# Patient Record
Sex: Female | Born: 1950 | Race: Black or African American | Hispanic: No | Marital: Married | Smoking: Never smoker
Health system: Southern US, Community
[De-identification: ages and names within clinical notes are randomized; demographics above are authoritative.]

## PROBLEM LIST (undated history)

## (undated) DIAGNOSIS — H409 Unspecified glaucoma: Secondary | ICD-10-CM

## (undated) DIAGNOSIS — E785 Hyperlipidemia, unspecified: Secondary | ICD-10-CM

## (undated) DIAGNOSIS — I10 Essential (primary) hypertension: Secondary | ICD-10-CM

## (undated) HISTORY — PX: TUBAL LIGATION: SHX77

## (undated) HISTORY — PX: OTHER SURGICAL HISTORY: SHX169

## (undated) HISTORY — DX: Unspecified glaucoma: H40.9

## (undated) HISTORY — DX: Hyperlipidemia, unspecified: E78.5

## (undated) HISTORY — DX: Essential (primary) hypertension: I10

---

## 1998-04-07 ENCOUNTER — Encounter: Payer: Self-pay | Admitting: Emergency Medicine

## 1998-04-07 ENCOUNTER — Emergency Department (HOSPITAL_COMMUNITY): Admission: EM | Admit: 1998-04-07 | Discharge: 1998-04-07 | Payer: Self-pay | Admitting: Emergency Medicine

## 1999-07-23 ENCOUNTER — Other Ambulatory Visit: Admission: RE | Admit: 1999-07-23 | Discharge: 1999-07-23 | Payer: Self-pay | Admitting: Obstetrics and Gynecology

## 2000-07-22 ENCOUNTER — Other Ambulatory Visit: Admission: RE | Admit: 2000-07-22 | Discharge: 2000-07-22 | Payer: Self-pay | Admitting: Obstetrics and Gynecology

## 2006-03-04 ENCOUNTER — Other Ambulatory Visit: Admission: RE | Admit: 2006-03-04 | Discharge: 2006-03-04 | Payer: Self-pay | Admitting: Obstetrics & Gynecology

## 2006-03-27 ENCOUNTER — Ambulatory Visit: Payer: Self-pay | Admitting: Family Medicine

## 2006-03-27 LAB — CONVERTED CEMR LAB
ALT: 16 units/L (ref 0–40)
AST: 21 units/L (ref 0–37)
Albumin: 3.6 g/dL (ref 3.5–5.2)
Alkaline Phosphatase: 57 units/L (ref 39–117)
BUN: 11 mg/dL (ref 6–23)
Basophils Absolute: 0 10*3/uL (ref 0.0–0.1)
Basophils Relative: 1.5 % — ABNORMAL HIGH (ref 0.0–1.0)
Bilirubin, Direct: 0.1 mg/dL (ref 0.0–0.3)
CO2: 27 meq/L (ref 19–32)
Calcium: 9.2 mg/dL (ref 8.4–10.5)
Chloride: 107 meq/L (ref 96–112)
Cholesterol: 291 mg/dL (ref 0–200)
Creatinine, Ser: 0.6 mg/dL (ref 0.4–1.2)
Creatinine,U: 231 mg/dL
Direct LDL: 215.6 mg/dL
Eosinophils Absolute: 0.1 10*3/uL (ref 0.0–0.6)
Eosinophils Relative: 1.7 % (ref 0.0–5.0)
GFR calc Af Amer: 133 mL/min
GFR calc non Af Amer: 110 mL/min
Glucose, Bld: 82 mg/dL (ref 70–99)
HCT: 40.7 % (ref 36.0–46.0)
HDL: 48.4 mg/dL (ref >39.0)
Hemoglobin: 14.2 g/dL (ref 12.0–15.0)
Hgb A1c MFr Bld: 5.5 % (ref 4.6–6.0)
Lymphocytes Relative: 54.3 % — ABNORMAL HIGH (ref 12.0–46.0)
MCHC: 35 g/dL (ref 30.0–36.0)
MCV: 85 fL (ref 78.0–100.0)
Microalb Creat Ratio: 4.3 mg/g (ref 0.0–30.0)
Microalb, Ur: 1 mg/dL (ref 0.0–1.9)
Monocytes Absolute: 0.3 10*3/uL (ref 0.2–0.7)
Monocytes Relative: 8.9 % (ref 3.0–11.0)
Neutro Abs: 1.1 10*3/uL — ABNORMAL LOW (ref 1.4–7.7)
Neutrophils Relative %: 33.6 % — ABNORMAL LOW (ref 43.0–77.0)
Platelets: 282 10*3/uL (ref 150–400)
Potassium: 3.6 meq/L (ref 3.5–5.1)
RBC: 4.79 M/uL (ref 3.87–5.11)
RDW: 12 % (ref 11.5–14.6)
Sodium: 140 meq/L (ref 135–145)
TSH: 2.83 microintl units/mL (ref 0.35–5.50)
Total Bilirubin: 1.1 mg/dL (ref 0.3–1.2)
Total CHOL/HDL Ratio: 6
Total Protein: 7.4 g/dL (ref 6.0–8.3)
Triglycerides: 105 mg/dL (ref 0–149)
VLDL: 21 mg/dL (ref 0–40)
WBC: 3.3 10*3/uL — ABNORMAL LOW (ref 4.5–10.5)

## 2006-03-28 ENCOUNTER — Encounter: Payer: Self-pay | Admitting: Family Medicine

## 2006-03-28 LAB — CONVERTED CEMR LAB: Vit D, 1,25-Dihydroxy: <7 — ABNORMAL LOW (ref 20–57)

## 2006-04-01 ENCOUNTER — Ambulatory Visit: Payer: Self-pay | Admitting: Family Medicine

## 2006-04-17 ENCOUNTER — Ambulatory Visit: Payer: Self-pay | Admitting: Family Medicine

## 2006-04-18 LAB — CONVERTED CEMR LAB
BUN: 20 mg/dL (ref 6–23)
CO2: 29 meq/L (ref 19–32)
Calcium: 9.8 mg/dL (ref 8.4–10.5)
Chloride: 100 meq/L (ref 96–112)
Creatinine, Ser: 0.9 mg/dL (ref 0.4–1.2)
GFR calc Af Amer: 83 mL/min
GFR calc non Af Amer: 69 mL/min
Glucose, Bld: 81 mg/dL (ref 70–99)
Potassium: 3.8 meq/L (ref 3.5–5.1)
Sodium: 135 meq/L (ref 135–145)

## 2006-05-27 ENCOUNTER — Ambulatory Visit: Payer: Self-pay | Admitting: Family Medicine

## 2006-05-27 LAB — CONVERTED CEMR LAB
ALT: 15 units/L (ref 0–40)
AST: 21 units/L (ref 0–37)
Albumin: 3.4 g/dL — ABNORMAL LOW (ref 3.5–5.2)
Alkaline Phosphatase: 76 units/L (ref 39–117)
Bilirubin, Direct: 0.1 mg/dL (ref 0.0–0.3)
Cholesterol: 181 mg/dL (ref 0–200)
HDL: 46.2 mg/dL (ref >39.0)
LDL Cholesterol: 115 mg/dL — ABNORMAL HIGH (ref 0–99)
Total Bilirubin: 0.8 mg/dL (ref 0.3–1.2)
Total CHOL/HDL Ratio: 3.9
Total Protein: 6.7 g/dL (ref 6.0–8.3)
Triglycerides: 99 mg/dL (ref 0–149)
VLDL: 20 mg/dL (ref 0–40)

## 2006-12-15 DIAGNOSIS — E785 Hyperlipidemia, unspecified: Secondary | ICD-10-CM | POA: Insufficient documentation

## 2009-10-18 ENCOUNTER — Encounter (INDEPENDENT_AMBULATORY_CARE_PROVIDER_SITE_OTHER): Payer: Self-pay | Admitting: *Deleted

## 2010-03-20 NOTE — Letter (Signed)
Summary: Colonoscopy Letter  [facility]  [address] [place], [place] [address]   Phone: [phone]  Fax: [phone]      October 18, 2009 MRN: [id number]   [facility] [address] [place], [place]  [address]   Dear Ms. Troxler,   According to your medical record, it is time for you to schedule a Colonoscopy. The American Cancer Society recommends this procedure as a method to detect early colon cancer. Patients with a family history of colon cancer, or a personal history of colon polyps or inflammatory bowel disease are at increased risk.  This letter has beeen generated based on the recommendations made at the time of your procedure. If you feel that in your particular situation this may no longer apply, please contact our office.  Please call our office at [phone] to schedule this appointment or to update your records at your earliest convenience.  Thank you for cooperating with [place] to provide you with the very best care possible.   Sincerely,   Iva Boop, M.D.  [facility] [phone]

## 2011-09-05 ENCOUNTER — Encounter: Payer: Self-pay | Admitting: Family Medicine

## 2011-09-05 ENCOUNTER — Other Ambulatory Visit (HOSPITAL_COMMUNITY)
Admission: RE | Admit: 2011-09-05 | Discharge: 2011-09-05 | Disposition: A | Discharge status: Home / Self Care | Payer: BC Managed Care – PPO | Source: Ambulatory Visit | Attending: Family Medicine | Admitting: Family Medicine

## 2011-09-05 ENCOUNTER — Other Ambulatory Visit: Payer: Self-pay | Admitting: Family Medicine

## 2011-09-05 ENCOUNTER — Ambulatory Visit (INDEPENDENT_AMBULATORY_CARE_PROVIDER_SITE_OTHER): Payer: BC Managed Care – PPO | Admitting: Family Medicine

## 2011-09-05 VITALS — BP 120/80 | Temp 98.2°F | Ht 65.0 in | Wt 202.0 lb

## 2011-09-05 DIAGNOSIS — H409 Unspecified glaucoma: Secondary | ICD-10-CM

## 2011-09-05 DIAGNOSIS — Z1231 Encounter for screening mammogram for malignant neoplasm of breast: Secondary | ICD-10-CM

## 2011-09-05 DIAGNOSIS — Z01419 Encounter for gynecological examination (general) (routine) without abnormal findings: Secondary | ICD-10-CM | POA: Insufficient documentation

## 2011-09-05 DIAGNOSIS — Z0000 Encounter for general adult medical examination without abnormal findings: Secondary | ICD-10-CM | POA: Insufficient documentation

## 2011-09-05 DIAGNOSIS — E785 Hyperlipidemia, unspecified: Secondary | ICD-10-CM

## 2011-09-05 DIAGNOSIS — I10 Essential (primary) hypertension: Secondary | ICD-10-CM

## 2011-09-05 LAB — CBC WITH DIFFERENTIAL/PLATELET
Basophils Absolute: 0 10*3/uL (ref 0.0–0.1)
Basophils Relative: 0.5 % (ref 0.0–3.0)
Eosinophils Absolute: 0.1 10*3/uL (ref 0.0–0.7)
Eosinophils Relative: 1.7 % (ref 0.0–5.0)
HCT: 42.2 % (ref 36.0–46.0)
Hemoglobin: 14 g/dL (ref 12.0–15.0)
Lymphocytes Relative: 50.2 % — ABNORMAL HIGH (ref 12.0–46.0)
Lymphs Abs: 2.1 10*3/uL (ref 0.7–4.0)
MCHC: 33.2 g/dL (ref 30.0–36.0)
MCV: 87.4 fl (ref 78.0–100.0)
Monocytes Absolute: 0.4 10*3/uL (ref 0.1–1.0)
Monocytes Relative: 8.7 % (ref 3.0–12.0)
Neutro Abs: 1.6 10*3/uL (ref 1.4–7.7)
Neutrophils Relative %: 38.9 % — ABNORMAL LOW (ref 43.0–77.0)
Platelets: 217 10*3/uL (ref 150.0–400.0)
RBC: 4.84 Mil/uL (ref 3.87–5.11)
RDW: 13 % (ref 11.5–14.6)
WBC: 4.2 10*3/uL — ABNORMAL LOW (ref 4.5–10.5)

## 2011-09-05 LAB — LDL CHOLESTEROL, DIRECT: Direct LDL: 219.7 mg/dL

## 2011-09-05 LAB — HEPATIC FUNCTION PANEL
ALT: 19 U/L (ref 0–35)
AST: 25 U/L (ref 0–37)
Albumin: 4.1 g/dL (ref 3.5–5.2)
Alkaline Phosphatase: 68 U/L (ref 39–117)
Bilirubin, Direct: 0.1 mg/dL (ref 0.0–0.3)
Total Bilirubin: 0.9 mg/dL (ref 0.3–1.2)
Total Protein: 7.5 g/dL (ref 6.0–8.3)

## 2011-09-05 LAB — LIPID PANEL
Cholesterol: 300 mg/dL — ABNORMAL HIGH (ref 0–200)
HDL: 51.4 mg/dL (ref >39.00)
Total CHOL/HDL Ratio: 6
Triglycerides: 91 mg/dL (ref 0.0–149.0)
VLDL: 18.2 mg/dL (ref 0.0–40.0)

## 2011-09-05 LAB — BASIC METABOLIC PANEL
BUN: 11 mg/dL (ref 6–23)
Calcium: 9.2 mg/dL (ref 8.4–10.5)
Creatinine, Ser: 0.8 mg/dL (ref 0.4–1.2)
GFR: 100.46 mL/min (ref >60.00)
Potassium: 4.2 mEq/L (ref 3.5–5.1)

## 2011-09-05 LAB — POCT URINALYSIS DIPSTICK
Bilirubin, UA: NEGATIVE
Leukocytes, UA: NEGATIVE
Nitrite, UA: NEGATIVE
Protein, UA: NEGATIVE
Urobilinogen, UA: 0.2
pH, UA: 6

## 2011-09-05 NOTE — Progress Notes (Signed)
  Subjective:    Patient ID: Molly Fuller, female    DOB: 05-Jun-1950, 61 y.o.   MRN: [id number]  HPI Molly Fuller is a 61 year old married female nonsmoker who comes in as a new patient,,,,,,,, we last saw her about 4 years ago,,,,,,,, for general physical examination  She has been retired now for about 4 years and stopped her lisinopril. Blood pressure today 120/80  She also hyperlipidemia and was on Zocor and stopped her Zocor and has been trying diet and exercise.  She sees Dr. Alden Hipp every 3 months and is on eyedrops because she has glaucoma.  She does have a history of postmenopausal vaginal dryness and has used some vaginal cream in the past.  She gets routine eye care, dental care, colonoscopy and GI normal, tetanus 2010.   Review of Systems  Constitutional: Negative.   HENT: Negative.   Eyes: Negative.   Respiratory: Negative.   Cardiovascular: Negative.   Gastrointestinal: Negative.   Genitourinary: Negative.   Musculoskeletal: Negative.   Neurological: Negative.   Hematological: Negative.   Psychiatric/Behavioral: Negative.        Objective:   Physical Exam  Constitutional: She appears well-developed and well-nourished.  HENT:  Head: Normocephalic and atraumatic.  Right Ear: External ear normal.  Left Ear: External ear normal.  Nose: Nose normal.  Mouth/Throat: Oropharynx is clear and moist.  Eyes: EOM are normal. Pupils are equal, round, and reactive to light.  Neck: Normal range of motion. Neck supple. No thyromegaly present.  Cardiovascular: Normal rate, regular rhythm, normal heart sounds and intact distal pulses.  Exam reveals no gallop and no friction rub.   No murmur heard. Pulmonary/Chest: Effort normal and breath sounds normal.  Abdominal: Soft. Bowel sounds are normal. She exhibits no distension and no mass. There is no tenderness. There is no rebound.  Genitourinary: Vagina normal and uterus normal. Guaiac negative stool. No vaginal discharge found.         Bilateral breast exam normal except for chronic inverted nipples  Musculoskeletal: Normal range of motion.  Lymphadenopathy:    She has no cervical adenopathy.  Neurological: She is alert. She has normal reflexes. No cranial nerve deficit. She exhibits normal muscle tone. Coordination normal.  Skin: Skin is warm and dry.  Psychiatric: She has a normal mood and affect. Her behavior is normal. Judgment and thought content normal.          Assessment & Plan:  Healthy female  Normal blood pressure  History of hyperlipidemia check labs  Glaucoma continue followup by ophthalmology

## 2011-09-05 NOTE — Patient Instructions (Signed)
I will call you with reports on your labwork  Walk 30 minutes daily  Take a baby aspirin daily  Do a thorough breast exam monthly and get an annual mammogram  Return in one year sooner if any problems

## 2011-09-11 ENCOUNTER — Other Ambulatory Visit: Payer: Self-pay | Admitting: Family Medicine

## 2011-09-11 ENCOUNTER — Encounter: Payer: Self-pay | Admitting: Family Medicine

## 2011-09-11 DIAGNOSIS — E785 Hyperlipidemia, unspecified: Secondary | ICD-10-CM

## 2011-09-11 MED ORDER — ATORVASTATIN CALCIUM 20 MG PO TABS: 20.0000 mg | ORAL_TABLET | Freq: Every day | ORAL | Status: DC

## 2011-10-08 ENCOUNTER — Ambulatory Visit: Payer: Self-pay | Admitting: Family Medicine

## 2011-10-10 ENCOUNTER — Ambulatory Visit
Admission: RE | Admit: 2011-10-10 | Discharge: 2011-10-10 | Disposition: A | Discharge status: Home / Self Care | Payer: Federal, State, Local not specified - PPO | Source: Ambulatory Visit | Attending: Family Medicine | Admitting: Family Medicine

## 2011-10-10 DIAGNOSIS — Z1231 Encounter for screening mammogram for malignant neoplasm of breast: Secondary | ICD-10-CM

## 2011-11-15 ENCOUNTER — Encounter: Payer: Self-pay | Admitting: Internal Medicine

## 2012-04-15 ENCOUNTER — Encounter: Payer: Self-pay | Admitting: Internal Medicine

## 2012-05-13 ENCOUNTER — Ambulatory Visit (AMBULATORY_SURGERY_CENTER): Payer: Federal, State, Local not specified - PPO | Admitting: *Deleted

## 2012-05-13 ENCOUNTER — Encounter: Payer: Self-pay | Admitting: Internal Medicine

## 2012-05-13 VITALS — Ht 65.0 in | Wt 199.8 lb

## 2012-05-13 DIAGNOSIS — Z1211 Encounter for screening for malignant neoplasm of colon: Secondary | ICD-10-CM

## 2012-05-13 MED ORDER — NA SULFATE-K SULFATE-MG SULF 17.5-3.13-1.6 GM/177ML PO SOLN: ORAL | Status: DC

## 2012-05-27 ENCOUNTER — Encounter: Payer: Self-pay | Admitting: Internal Medicine

## 2012-05-27 ENCOUNTER — Ambulatory Visit (AMBULATORY_SURGERY_CENTER): Payer: Federal, State, Local not specified - PPO | Admitting: Internal Medicine

## 2012-05-27 VITALS — BP 137/85 | HR 43 | Temp 97.7°F | Resp 15 | Ht 65.0 in | Wt 199.0 lb

## 2012-05-27 DIAGNOSIS — D126 Benign neoplasm of colon, unspecified: Secondary | ICD-10-CM

## 2012-05-27 DIAGNOSIS — Z1211 Encounter for screening for malignant neoplasm of colon: Secondary | ICD-10-CM

## 2012-05-27 DIAGNOSIS — K648 Other hemorrhoids: Secondary | ICD-10-CM

## 2012-05-27 MED ORDER — SODIUM CHLORIDE 0.9 % IV SOLN: 500.0000 mL | INTRAVENOUS | Status: DC

## 2012-05-27 NOTE — Progress Notes (Signed)
Called to room to assist during endoscopic procedure.  Patient ID and intended procedure confirmed with present staff. Received instructions for my participation in the procedure from the performing physician.  

## 2012-05-27 NOTE — Progress Notes (Signed)
Lidocaine-40mg IV prior to Propofol InductionPropofol given over incremental dosages 

## 2012-05-27 NOTE — Progress Notes (Signed)
Patient did not experience any of the following events: a burn prior to discharge; a fall within the facility; wrong site/side/patient/procedure/implant event; or a hospital transfer or hospital admission upon discharge from the facility. (G8907) Patient did not have preoperative order for IV antibiotic SSI prophylaxis. (G8918)  

## 2012-05-27 NOTE — Patient Instructions (Addendum)
There was one tiny polyp removed - also have hemorrhoids - otherwise ok with great prep. The polyp looks benign.  I will let you know pathology results and when to have another routine colonoscopy by mail.  Thank you for choosing me and [facility].  Iva Boop, MD, FACG  YOU HAD AN ENDOSCOPIC PROCEDURE TODAY AT THE [facility]: Refer to the procedure report that was given to you for any specific questions about what was found during the examination.  If the procedure report does not answer your questions, please call your gastroenterologist to clarify.  If you requested that your care partner not be given the details of your procedure findings, then the procedure report has been included in a sealed envelope for you to review at your convenience later.  YOU SHOULD EXPECT: Some feelings of bloating in the abdomen. Passage of more gas than usual.  Walking can help get rid of the air that was put into your GI tract during the procedure and reduce the bloating. If you had a lower endoscopy (such as a colonoscopy or flexible sigmoidoscopy) you may notice spotting of blood in your stool or on the toilet paper. If you underwent a bowel prep for your procedure, then you may not have a normal bowel movement for a few days.  DIET: Your first meal following the procedure should be a light meal and then it is ok to progress to your normal diet.  A half-sandwich or bowl of soup is an example of a good first meal.  Heavy or fried foods are harder to digest and may make you feel nauseous or bloated.  Likewise meals heavy in dairy and vegetables can cause extra gas to form and this can also increase the bloating.  Drink plenty of fluids but you should avoid alcoholic beverages for 24 hours.  ACTIVITY: Your care partner should take you home directly after the procedure.  You should plan to take it easy, moving slowly for the rest of the day.  You can resume normal activity the day after  the procedure however you should NOT DRIVE or use heavy machinery for 24 hours (because of the sedation medicines used during the test).    SYMPTOMS TO REPORT IMMEDIATELY: A gastroenterologist can be reached at any hour.  During normal business hours, 8:30 AM to 5:00 PM Monday through Friday, call [phone].  After hours and on weekends, please call the GI answering service at [phone] who will take a message and have the physician on call contact you.   Following lower endoscopy (colonoscopy or flexible sigmoidoscopy):  Excessive amounts of blood in the stool  Significant tenderness or worsening of abdominal pains  Swelling of the abdomen that is new, acute  Fever of 100F or higher  FOLLOW UP: If any biopsies were taken you will be contacted by phone or by letter within the next 1-3 weeks.  Call your gastroenterologist if you have not heard about the biopsies in 3 weeks.  Our staff will call the home number listed on your records the next business day following your procedure to check on you and address any questions or concerns that you may have at that time regarding the information given to you following your procedure. This is a courtesy call and so if there is no answer at the home number and we have not heard from you through the emergency physician on call, we will assume that you have returned to your regular daily  activities without incident.  SIGNATURES/CONFIDENTIALITY: You and/or your care partner have signed paperwork which will be entered into your electronic medical record.  These signatures attest to the fact that that the information above on your After Visit Summary has been reviewed and is understood.  Full responsibility of the confidentiality of this discharge information lies with you and/or your care-partner.  Polyp-handout given  Hemorrhoids-handout given  Repeat colonoscopy will be determined by pathology

## 2012-05-27 NOTE — Op Note (Signed)
[facility] [address] [place], [address]   COLONOSCOPY PROCEDURE REPORT  PATIENT: Molly Fuller, Molly Fuller  MR#: [id number] BIRTHDATE: Jun 23, 1950 , 61  yrs. old GENDER: Female ENDOSCOPIST: Iva Boop, MD, [facility] PROCEDURE DATE:  05/27/2012 PROCEDURE:   Colonoscopy with snare polypectomy ASA CLASS:   Class II INDICATIONS:average risk screening.   second exam  - last 10 yrs ago MEDICATIONS: propofol (Diprivan) 250mg  IV, MAC sedation, administered by CRNA, and These medications were titrated to patient response per physician's verbal order  DESCRIPTION OF PROCEDURE:   After the risks benefits and alternatives of the procedure were thoroughly explained, informed consent was obtained.  A digital rectal exam revealed no abnormalities of the rectum.   The LB CF-[id number]  endoscope was introduced through the anus and advanced to the cecum, which was identified by both the appendix and ileocecal valve. No adverse events experienced.   The quality of the prep was Suprep excellent The instrument was then slowly withdrawn as the colon was fully examined.      COLON FINDINGS: A polypoid shaped sessile polyp measuring 4 mm in size was found in the sigmoid colon.  A polypectomy was performed with a cold snare.  The resection was complete and the polyp tissue was completely retrieved.   Small internal hemorrhoids were found. The colon mucosa was otherwise normal.   A right colon retroflexion was performed.  Retroflexed views revealed internal hemorrhoids. The time to cecum=2 minutes 07 seconds.  Withdrawal time=9 minutes 40 seconds.  The scope was withdrawn and the procedure completed. COMPLICATIONS: There were no complications.  ENDOSCOPIC IMPRESSION: 1.   Sessile polyp measuring 4 mm in size was found in the sigmoid colon; polypectomy was performed with a cold snare 2.   Small internal hemorrhoids 3.   The colon mucosa was otherwise normal - excellent  prep  RECOMMENDATIONS: Timing of repeat colonoscopy will be determined by pathology findings.   eSigned:  Iva Boop, MD, [facility] 05/27/2012 10:09 AM  cc: The Patient and Roderick Pee, MD

## 2012-05-28 ENCOUNTER — Telehealth: Payer: Self-pay | Admitting: *Deleted

## 2012-05-28 NOTE — Telephone Encounter (Signed)
  Follow up Call-  Call back number 05/27/2012  Post procedure Call Back phone  # [phone]  Permission to leave phone message Yes     Patient questions:  Do you have a fever, pain , or abdominal swelling? no Pain Score  0 *  Have you tolerated food without any problems? yes  Have you been able to return to your normal activities? yes  Do you have any questions about your discharge instructions: Diet   no Medications  no Follow up visit  no  Do you have questions or concerns about your Care? no  Actions: * If pain score is 4 or above: No action needed, pain <4.

## 2012-06-01 ENCOUNTER — Encounter: Payer: Self-pay | Admitting: Internal Medicine

## 2012-06-01 NOTE — Progress Notes (Signed)
Quick Note:  Diminutive hyperplastic polyp - sigmoid Repeat colonoscopy about 05/2022 ______

## 2012-09-15 ENCOUNTER — Other Ambulatory Visit: Payer: Self-pay

## 2012-09-15 DIAGNOSIS — Z1231 Encounter for screening mammogram for malignant neoplasm of breast: Secondary | ICD-10-CM

## 2012-10-13 ENCOUNTER — Ambulatory Visit
Admission: RE | Admit: 2012-10-13 | Discharge: 2012-10-13 | Disposition: A | Discharge status: Home / Self Care | Payer: Federal, State, Local not specified - PPO | Source: Ambulatory Visit

## 2012-10-13 DIAGNOSIS — Z1231 Encounter for screening mammogram for malignant neoplasm of breast: Secondary | ICD-10-CM

## 2012-10-16 ENCOUNTER — Other Ambulatory Visit: Payer: Federal, State, Local not specified - PPO

## 2012-11-03 ENCOUNTER — Encounter: Payer: Federal, State, Local not specified - PPO | Admitting: Family Medicine

## 2012-11-09 ENCOUNTER — Ambulatory Visit (INDEPENDENT_AMBULATORY_CARE_PROVIDER_SITE_OTHER): Payer: Federal, State, Local not specified - PPO | Admitting: Physician Assistant

## 2012-11-09 VITALS — BP 116/62 | HR 67 | Temp 98.7°F | Resp 18 | Ht 64.0 in | Wt 194.0 lb

## 2012-11-09 DIAGNOSIS — R05 Cough: Secondary | ICD-10-CM

## 2012-11-09 DIAGNOSIS — R0981 Nasal congestion: Secondary | ICD-10-CM

## 2012-11-09 DIAGNOSIS — R059 Cough, unspecified: Secondary | ICD-10-CM

## 2012-11-09 DIAGNOSIS — J209 Acute bronchitis, unspecified: Secondary | ICD-10-CM

## 2012-11-09 DIAGNOSIS — J3489 Other specified disorders of nose and nasal sinuses: Secondary | ICD-10-CM

## 2012-11-09 MED ORDER — HYDROCODONE-HOMATROPINE 5-1.5 MG/5ML PO SYRP: 5.0000 mL | ORAL_SOLUTION | Freq: Three times a day (TID) | ORAL | Status: DC | PRN

## 2012-11-09 MED ORDER — IPRATROPIUM BROMIDE 0.03 % NA SOLN: 2.0000 | Freq: Two times a day (BID) | NASAL | Status: DC

## 2012-11-09 MED ORDER — AZITHROMYCIN 250 MG PO TABS: ORAL_TABLET | ORAL | Status: DC

## 2012-11-09 NOTE — Progress Notes (Signed)
  Subjective:    Patient ID: Molly Fuller, female    DOB: 1950/04/25, 62 y.o.   MRN: [id number]  HPI 62 year old female presents with 1 week history of cough, chest congestion, fatigue, and chest pain while coughing.  States symptoms have progressively worsened.  Denies fever, chills, nausea, vomiting, sore throat, headache, sinus pain, SOB, wheezing, or otalgia.  Does admit to nasal congestion, rhinorrhea, and PND.  She has been taking Mucinex which does seem to help slightly.  Cough is worse at night and is interfering with sleep.   Patient is otherwise doing well with no other concerns today.      Review of Systems  Constitutional: Negative for fever and chills.  HENT: Positive for congestion, rhinorrhea and postnasal drip. Negative for sore throat and sinus pressure.   Respiratory: Negative for chest tightness, shortness of breath and wheezing.   Cardiovascular: Positive for chest pain (while coughing).  Gastrointestinal: Negative for nausea and vomiting.  Neurological: Negative for dizziness and headaches.       Objective:   Physical Exam  Constitutional: She is oriented to person, place, and time. She appears well-developed and well-nourished.  HENT:  Head: Normocephalic and atraumatic.  Right Ear: Hearing, tympanic membrane, external ear and ear canal normal.  Left Ear: Hearing, tympanic membrane, external ear and ear canal normal.  Mouth/Throat: Uvula is midline, oropharynx is clear and moist and mucous membranes are normal.  Eyes: Conjunctivae are normal.  Cardiovascular: Normal rate, regular rhythm and normal heart sounds.   Pulmonary/Chest: Effort normal and breath sounds normal.  Lymphadenopathy:    She has no cervical adenopathy.  Neurological: She is alert and oriented to person, place, and time.  Psychiatric: She has a normal mood and affect. Her behavior is normal. Judgment and thought content normal.          Assessment & Plan:  Nasal congestion - Plan:  ipratropium (ATROVENT) 0.03 % nasal spray  Cough - Plan: HYDROcodone-homatropine (HYCODAN) 5-1.5 MG/5ML syrup  Acute bronchitis - Plan: azithromycin (ZITHROMAX) 250 MG tablet  Start Zpack as directed Hycodan qhs prn cough Atrovent NS twice daily to help with congestion and PND Increase fluids and rest Follow up if symptoms worsen or fail to improve.

## 2012-11-29 ENCOUNTER — Ambulatory Visit (INDEPENDENT_AMBULATORY_CARE_PROVIDER_SITE_OTHER): Payer: Federal, State, Local not specified - PPO | Admitting: Internal Medicine

## 2012-11-29 VITALS — BP 118/72 | HR 64 | Temp 98.6°F | Resp 16 | Ht 64.0 in | Wt 198.2 lb

## 2012-11-29 DIAGNOSIS — R350 Frequency of micturition: Secondary | ICD-10-CM

## 2012-11-29 DIAGNOSIS — R300 Dysuria: Secondary | ICD-10-CM

## 2012-11-29 DIAGNOSIS — N390 Urinary tract infection, site not specified: Secondary | ICD-10-CM

## 2012-11-29 LAB — POCT URINALYSIS DIPSTICK
Bilirubin, UA: NEGATIVE
Glucose, UA: NEGATIVE
Nitrite, UA: NEGATIVE
pH, UA: 6

## 2012-11-29 LAB — POCT UA - MICROSCOPIC ONLY
Bacteria, U Microscopic: UNDETERMINED
Casts, Ur, LPF, POC: UNDETERMINED

## 2012-11-29 MED ORDER — CIPROFLOXACIN HCL 500 MG PO TABS: 500.0000 mg | ORAL_TABLET | Freq: Two times a day (BID) | ORAL | Status: DC

## 2012-11-29 NOTE — Progress Notes (Signed)
  Subjective:  This chart was scribed for Molly Sia, MD by Quintella Reichert, ED scribe.  This patient was seen in room [facility] Room 1 and the patient's care was started at 11:20 AM.   Patient ID: Molly Fuller, female    DOB: 1950/06/06, 62 y.o.   MRN: [id number]  HPI  HPI Comments: Molly Fuller is a 62 y.o. female who presents with a chief complaint of 4-5 days of progressively-worsening urinary symptoms.  Pt states she has been urinating frequently and has had transient urethral pain towards the end of voiding.  She also initially was experiencing some nocturia which resolved after she began taking Cystex.  She had some fever and chills last night.  She denies back pain, or belly pain.  Pt's last UTI was January 2014.   Past Medical History  Diagnosis Date  . Hypertension   . Hyperlipidemia   . Glaucoma     left eye    No Known Allergies   Review of Systems     Objective:   Physical Exam  Constitutional: She appears well-developed and well-nourished. No distress.  Abdominal: Soft. There is no tenderness. There is no rebound and no guarding.  No CVA tenderness to percussion     BP 118/72  Pulse 64  Temp(Src) 98.6 F (37 C) (Oral)  Resp 16  Ht 5\' 4"  (1.626 m)  Wt 198 lb 3.2 oz (89.903 kg)  BMI 34 kg/m2  SpO2 98%  LMP 02/19/2000   Results for orders placed in visit on 11/29/12  POCT URINALYSIS DIPSTICK      Result Value Range   Color, UA light yellow     Clarity, UA cloudy     Glucose, UA neg     Bilirubin, UA neg     Ketones, UA neg     Spec Grav, UA 1.010     Blood, UA large     pH, UA 6.0     Protein, UA 100     Urobilinogen, UA 0.2     Nitrite, UA neg     Leukocytes, UA large (3+)    POCT UA - MICROSCOPIC ONLY      Result Value Range   WBC, Ur, HPF, POC tntc     RBC, urine, microscopic tntc     Bacteria, U Microscopic unable to see     Mucus, UA unable to see     Epithelial cells, urine per micros unable to see     Crystals, Ur, HPF,  POC unable to see     Casts, Ur, LPF, POC unable to see     Yeast, UA unable to see          Assessment & Plan:  UTI Urine culture, Cipro    I have reviewed and agree with documentation. Dorthey Depace P. Merla Riches, M.D.

## 2012-12-01 LAB — URINE CULTURE: Colony Count: 60000

## 2012-12-29 ENCOUNTER — Other Ambulatory Visit (INDEPENDENT_AMBULATORY_CARE_PROVIDER_SITE_OTHER): Payer: Federal, State, Local not specified - PPO

## 2012-12-29 DIAGNOSIS — Z0000 Encounter for general adult medical examination without abnormal findings: Secondary | ICD-10-CM

## 2012-12-29 DIAGNOSIS — E785 Hyperlipidemia, unspecified: Secondary | ICD-10-CM

## 2012-12-29 LAB — BASIC METABOLIC PANEL
Calcium: 9.5 mg/dL (ref 8.4–10.5)
Chloride: 105 mEq/L (ref 96–112)
Creatinine, Ser: 0.8 mg/dL (ref 0.4–1.2)
Sodium: 138 mEq/L (ref 135–145)

## 2012-12-29 LAB — LIPID PANEL
Total CHOL/HDL Ratio: 6
VLDL: 14.2 mg/dL (ref 0.0–40.0)

## 2012-12-29 LAB — POCT URINALYSIS DIPSTICK
Ketones, UA: NEGATIVE
Protein, UA: NEGATIVE
Spec Grav, UA: 1.025
pH, UA: 5.5

## 2012-12-29 LAB — HEPATIC FUNCTION PANEL
ALT: 34 U/L (ref 0–35)
Alkaline Phosphatase: 75 U/L (ref 39–117)
Bilirubin, Direct: 0.1 mg/dL (ref 0.0–0.3)
Total Protein: 7.7 g/dL (ref 6.0–8.3)

## 2012-12-29 LAB — CBC WITH DIFFERENTIAL/PLATELET
Basophils Relative: 0.4 % (ref 0.0–3.0)
Eosinophils Relative: 1.5 % (ref 0.0–5.0)
Lymphocytes Relative: 40.6 % (ref 12.0–46.0)
MCV: 84.4 fl (ref 78.0–100.0)
Neutrophils Relative %: 51.3 % (ref 43.0–77.0)
RBC: 4.95 Mil/uL (ref 3.87–5.11)
WBC: 4.7 10*3/uL (ref 4.5–10.5)

## 2013-01-05 ENCOUNTER — Ambulatory Visit (INDEPENDENT_AMBULATORY_CARE_PROVIDER_SITE_OTHER): Payer: Federal, State, Local not specified - PPO | Admitting: Family Medicine

## 2013-01-05 ENCOUNTER — Encounter: Payer: Self-pay | Admitting: Family Medicine

## 2013-01-05 ENCOUNTER — Other Ambulatory Visit (HOSPITAL_COMMUNITY)
Admission: RE | Admit: 2013-01-05 | Discharge: 2013-01-05 | Disposition: A | Discharge status: Home / Self Care | Payer: Federal, State, Local not specified - PPO | Source: Ambulatory Visit | Attending: Family Medicine | Admitting: Family Medicine

## 2013-01-05 VITALS — BP 120/80 | Temp 98.1°F | Ht 65.0 in | Wt 200.0 lb

## 2013-01-05 DIAGNOSIS — Z0000 Encounter for general adult medical examination without abnormal findings: Secondary | ICD-10-CM

## 2013-01-05 DIAGNOSIS — I10 Essential (primary) hypertension: Secondary | ICD-10-CM

## 2013-01-05 DIAGNOSIS — E785 Hyperlipidemia, unspecified: Secondary | ICD-10-CM

## 2013-01-05 DIAGNOSIS — M810 Age-related osteoporosis without current pathological fracture: Secondary | ICD-10-CM

## 2013-01-05 DIAGNOSIS — Z01419 Encounter for gynecological examination (general) (routine) without abnormal findings: Secondary | ICD-10-CM | POA: Insufficient documentation

## 2013-01-05 MED ORDER — ATORVASTATIN CALCIUM 20 MG PO TABS: 20.0000 mg | ORAL_TABLET | Freq: Every day | ORAL | Status: DC

## 2013-01-05 NOTE — Progress Notes (Signed)
  Subjective:    Patient ID: Molly Fuller, female    DOB: 09/18/1950, 62 y.o.   MRN: [id number]  HPI Terez is a 62 year old married female nonsmoker who comes in today for general physical examination  She takes Lipitor 20 mg but only once or twice weekly for hyperlipidemia. She is on eyedrops for ophthalmologist because of glaucoma in her left eye  She gets routine eye care, dental care, BSE monthly, and you mammography, recent colonoscopy normal, LMP 14 years ago recent Pap a year ago normal  Vaccinations up-to-date    Review of Systems  Constitutional: Negative.   HENT: Negative.   Eyes: Negative.   Respiratory: Negative.   Cardiovascular: Negative.   Gastrointestinal: Negative.   Endocrine: Negative.   Genitourinary: Negative.   Musculoskeletal: Negative.   Allergic/Immunologic: Negative.   Neurological: Negative.   Hematological: Negative.   Psychiatric/Behavioral: Negative.        Objective:   Physical Exam  Constitutional: She appears well-developed and well-nourished.  HENT:  Head: Normocephalic and atraumatic.  Right Ear: External ear normal.  Left Ear: External ear normal.  Nose: Nose normal.  Mouth/Throat: Oropharynx is clear and moist.  Eyes: EOM are normal. Pupils are equal, round, and reactive to light.  Neck: Normal range of motion. Neck supple. No thyromegaly present.  Cardiovascular: Normal rate, regular rhythm, normal heart sounds and intact distal pulses.  Exam reveals no gallop and no friction rub.   No murmur heard. Pulmonary/Chest: Effort normal and breath sounds normal.  Abdominal: Soft. Bowel sounds are normal. She exhibits no distension and no mass. There is no tenderness. There is no rebound.  Genitourinary: Vagina normal and uterus normal. Guaiac negative stool. No vaginal discharge found.  Bilateral breast exam normal chronically inverted nipples  Musculoskeletal: Normal range of motion.  Lymphadenopathy:    She has no cervical  adenopathy.  Neurological: She is alert. She has normal reflexes. No cranial nerve deficit. She exhibits normal muscle tone. Coordination normal.  Skin: Skin is warm and dry.  Psychiatric: She has a normal mood and affect. Her behavior is normal. Judgment and thought content normal.          Assessment & Plan:  Healthy female  Hyper lipidemia LDL 262 advised to take her Lipitor and aspirin tablet daily  Followup lipid panel in 3 months  Elevated TSH followup in 3 months  Overweight discussed diet and exercise and weight loss program

## 2013-01-05 NOTE — Patient Instructions (Signed)
Take the aspirin and Lipitor daily  Begin a walking program 30 minutes daily,,,,,,,,,,,, [facility]  Return in 3 months for followup  Fasting labs one week prior

## 2013-01-05 NOTE — Progress Notes (Signed)
Pre visit review using our clinic review tool, if applicable. No additional management support is needed unless otherwise documented below in the visit note. 

## 2013-01-07 ENCOUNTER — Telehealth: Payer: Self-pay | Admitting: Family Medicine

## 2013-01-07 NOTE — Telephone Encounter (Signed)
Patient Information:  Caller Name: [facility]  Phone: [phone]  Patient: Molly Fuller  Gender: Female  DOB: May 08, 1950  Age: 62 Years  PCP: Kelle Darting [facility])  Office Follow Up:  Does the office need to follow up with this patient?: No  Instructions For The Office: N/A   Symptoms  Reason For Call & Symptoms: sore/scratchy throat for several days but resolved today, mild cough with productive cough x 1 of some yellow mucus. Mild nasal congestion. No fever.  Reviewed Health History In EMR: Yes  Reviewed Medications In EMR: Yes  Reviewed Allergies In EMR: Yes  Reviewed Surgeries / Procedures: Yes  Date of Onset of Symptoms: 01/04/2013  Treatments Tried: gargling with salt water; hot tea  Treatments Tried Worked: Yes  Guideline(s) Used:  Colds  Disposition Per Guideline:   Home Care  Reason For Disposition Reached:   Colds with no complications  Advice Given:  Reassurance  It sounds like an uncomplicated cold that we can treat at home.  Colds are very common and may make you feel uncomfortable.  Colds are caused by viruses, and no medicine or "shot" will cure an uncomplicated cold.  Here is some care advice that should help.  For a Runny Nose With Profuse Discharge:   Nasal mucus and discharge helps to wash viruses and bacteria out of the nose and sinuses.  Blowing the nose is all that is needed.  If the skin around your nostrils gets irritated, apply a tiny amount of petroleum ointment to the nasal openings once or twice a day.  For a Stuffy Nose - Use Nasal Washes:  Introduction: Saline (salt water) nasal irrigation (nasal wash) is an effective and simple home remedy for treating stuffy nose and sinus congestion. The nose can be irrigated by pouring, spraying, or squirting salt water into the nose and then letting it run back out.  How it Helps: The salt water rinses out excess mucus, washes out any irritants (dust, allergens) that might be present, and  moistens the nasal cavity.  Treatment for Associated Symptoms of Colds:  For muscle aches, headaches, or moderate fever (more than 101 F or 38.9 C): Take acetaminophen every 4 hours.  Sore throat: Try throat lozenges, hard candy, or warm chicken broth.  Hydrate: Drink adequate liquids.  Humidifier:  If the air in your home is dry, use a cool-mist humidifier  Expected Course:   Fever may last 2-3 days  Nasal discharge 7-14 days  Cough up to 2-3 weeks.  Call Back If:  Difficulty breathing occurs  Fever lasts more than 3 days  Nasal discharge lasts more than 10 days  Cough lasts more than 3 weeks  You become worse  Patient Will Follow Care Advice:  YES

## 2013-01-08 ENCOUNTER — Ambulatory Visit (INDEPENDENT_AMBULATORY_CARE_PROVIDER_SITE_OTHER): Payer: Federal, State, Local not specified - PPO | Admitting: Emergency Medicine

## 2013-01-08 VITALS — BP 122/70 | HR 69 | Temp 98.7°F | Resp 18 | Ht 64.0 in | Wt 201.0 lb

## 2013-01-08 DIAGNOSIS — J209 Acute bronchitis, unspecified: Secondary | ICD-10-CM

## 2013-01-08 DIAGNOSIS — J018 Other acute sinusitis: Secondary | ICD-10-CM

## 2013-01-08 MED ORDER — HYDROCOD POLST-CHLORPHEN POLST 10-8 MG/5ML PO LQCR: 5.0000 mL | Freq: Two times a day (BID) | ORAL | Status: DC | PRN

## 2013-01-08 MED ORDER — PSEUDOEPHEDRINE-GUAIFENESIN ER 60-600 MG PO TB12: 1.0000 | ORAL_TABLET | Freq: Two times a day (BID) | ORAL | Status: DC

## 2013-01-08 MED ORDER — AMOXICILLIN-POT CLAVULANATE 875-125 MG PO TABS: 1.0000 | ORAL_TABLET | Freq: Two times a day (BID) | ORAL | Status: DC

## 2013-01-08 NOTE — Progress Notes (Signed)
[facility] [address] [place] [address] [phone]  Date:  01/08/2013   Name:  Molly Fuller   DOB:  03/19/1950   MRN:  [id number]  PCP:  Evette Georges, MD    Chief Complaint: Cough and Sore Throat   History of Present Illness:  Molly Fuller is a 62 y.o. very pleasant female patient who presents with the following:  Ill all week with post nasal drip and sore throat.  Cough has worsened.  No fever or chills.  No wheezing or shortness of breath.  Cough is worse at night.  Purulent sputum in AM's .  Some fullness in left ear.  No drainage. Appetite normal.  Malaise and arthralgias.  No improvement with over the counter medications or other home remedies. Denies other complaint or health concern today.   Patient Active Problem List   Diagnosis Date Noted  . Routine general medical examination at a health care facility 09/05/2011  . Glaucoma 09/05/2011  . HYPERLIPIDEMIA 12/15/2006    Past Medical History  Diagnosis Date  . Hypertension   . Hyperlipidemia   . Glaucoma     left eye    Past Surgical History  Procedure Laterality Date  . No prior surgery      History  Substance Use Topics  . Smoking status: Never Smoker   . Smokeless tobacco: Never Used  . Alcohol Use: No    Family History  Problem Relation Age of Onset  . Hypertension Other   . Diabetes Other   . Colon cancer Paternal Grandmother 26    No Known Allergies  Medication list has been reviewed and updated.  Current Outpatient Prescriptions on File Prior to Visit  Medication Sig Dispense Refill  . atorvastatin (LIPITOR) 20 MG tablet Take 1 tablet (20 mg total) by mouth daily.  90 tablet  3  . bimatoprost (LUMIGAN) 0.03 % ophthalmic solution Place 1 drop into both eyes at bedtime.      . brimonidine-timolol (COMBIGAN) 0.2-0.5 % ophthalmic solution Place 1 drop into both eyes every 12 (twelve) hours.      Marland Kitchen ipratropium (ATROVENT) 0.03 % nasal spray Place 2  sprays into the nose 2 (two) times daily.  30 mL  5   No current facility-administered medications on file prior to visit.    Review of Systems:  As per HPI, otherwise negative.    Physical Examination: Filed Vitals:   01/08/13 0947  BP: 122/70  Pulse: 69  Temp: 98.7 F (37.1 C)  Resp: 18   Filed Vitals:   01/08/13 0947  Height: 5\' 4"  (1.626 m)  Weight: 201 lb (91.173 kg)   Body mass index is 34.48 kg/(m^2). Ideal Body Weight: Weight in (lb) to have BMI = 25: 145.3  GEN: WDWN, NAD, Non-toxic, A & O x 3 HEENT: Atraumatic, Normocephalic. Neck supple. No masses, No LAD. Ears and Nose: No external deformity.  Purulent nasal drainage CV: RRR, No M/G/R. No JVD. No thrill. No extra heart sounds. PULM: CTA B, no wheezes, crackles, rhonchi. No retractions. No resp. distress. No accessory muscle use. ABD: S, NT, ND, +BS. No rebound. No HSM. EXTR: No c/c/e NEURO Normal gait.  PSYCH: Normally interactive. Conversant. Not depressed or anxious appearing.  Calm demeanor.    Assessment and Plan: Sinusitis Bronchitis augmentin mucinex d tussionex   Signed,  Phillips Odor, MD

## 2013-01-08 NOTE — Patient Instructions (Signed)

## 2013-03-02 ENCOUNTER — Ambulatory Visit (INDEPENDENT_AMBULATORY_CARE_PROVIDER_SITE_OTHER)
Admission: RE | Admit: 2013-03-02 | Discharge: 2013-03-02 | Disposition: A | Discharge status: Home / Self Care | Payer: Federal, State, Local not specified - PPO | Source: Ambulatory Visit | Attending: Family Medicine | Admitting: Family Medicine

## 2013-03-02 DIAGNOSIS — M810 Age-related osteoporosis without current pathological fracture: Secondary | ICD-10-CM

## 2013-03-24 ENCOUNTER — Other Ambulatory Visit: Payer: Federal, State, Local not specified - PPO

## 2013-04-13 ENCOUNTER — Ambulatory Visit: Payer: Federal, State, Local not specified - PPO | Admitting: Family Medicine

## 2013-04-15 ENCOUNTER — Other Ambulatory Visit: Payer: Federal, State, Local not specified - PPO

## 2013-04-19 ENCOUNTER — Other Ambulatory Visit: Payer: Federal, State, Local not specified - PPO

## 2013-04-19 ENCOUNTER — Ambulatory Visit: Payer: Federal, State, Local not specified - PPO | Admitting: Family Medicine

## 2013-04-21 ENCOUNTER — Other Ambulatory Visit (INDEPENDENT_AMBULATORY_CARE_PROVIDER_SITE_OTHER): Payer: Federal, State, Local not specified - PPO

## 2013-04-21 DIAGNOSIS — E785 Hyperlipidemia, unspecified: Secondary | ICD-10-CM

## 2013-04-21 LAB — TSH: TSH: 2.19 u[IU]/mL (ref 0.35–5.50)

## 2013-04-21 LAB — HEPATIC FUNCTION PANEL
ALK PHOS: 73 U/L (ref 39–117)
ALT: 27 U/L (ref 0–35)
AST: 26 U/L (ref 0–37)
Albumin: 3.6 g/dL (ref 3.5–5.2)
Bilirubin, Direct: 0.2 mg/dL (ref 0.0–0.3)
Total Bilirubin: 1.5 mg/dL — ABNORMAL HIGH (ref 0.3–1.2)
Total Protein: 7.2 g/dL (ref 6.0–8.3)

## 2013-04-21 LAB — LIPID PANEL
CHOL/HDL RATIO: 3
Cholesterol: 170 mg/dL (ref 0–200)
HDL: 51.9 mg/dL (ref >39.00)
LDL Cholesterol: 108 mg/dL — ABNORMAL HIGH (ref 0–99)
Triglycerides: 52 mg/dL (ref 0.0–149.0)
VLDL: 10.4 mg/dL (ref 0.0–40.0)

## 2013-04-28 ENCOUNTER — Ambulatory Visit: Payer: Federal, State, Local not specified - PPO

## 2013-04-28 ENCOUNTER — Ambulatory Visit (INDEPENDENT_AMBULATORY_CARE_PROVIDER_SITE_OTHER): Payer: Federal, State, Local not specified - PPO | Admitting: Family Medicine

## 2013-04-28 VITALS — BP 132/82 | HR 87 | Temp 98.0°F | Resp 16 | Ht 64.0 in | Wt 201.0 lb

## 2013-04-28 DIAGNOSIS — M25579 Pain in unspecified ankle and joints of unspecified foot: Secondary | ICD-10-CM

## 2013-04-28 DIAGNOSIS — S93402A Sprain of unspecified ligament of left ankle, initial encounter: Secondary | ICD-10-CM

## 2013-04-28 DIAGNOSIS — M25572 Pain in left ankle and joints of left foot: Secondary | ICD-10-CM

## 2013-04-28 DIAGNOSIS — S93409A Sprain of unspecified ligament of unspecified ankle, initial encounter: Secondary | ICD-10-CM

## 2013-04-28 MED ORDER — HYDROCODONE-ACETAMINOPHEN 5-325 MG PO TABS: 1.0000 | ORAL_TABLET | Freq: Three times a day (TID) | ORAL | Status: DC | PRN

## 2013-04-28 NOTE — Progress Notes (Signed)
[facility] [address] [place] [address] [phone]  Date:  04/28/2013   Name:  Molly Fuller   DOB:  [birth year]   MRN:  [id number]  PCP:  Joycelyn Man, MD    Chief Complaint: Ankle Injury   History of Present Illness:  Molly Fuller is a 63 y.o. very pleasant female patient who presents with the following:  Here today with an injury.  She hurt her left ankle about an hour ago- she was pulling down a vine, the vine broke and she fell back. She twisterd her left ankle.  It hurt right away and is a bit swollen.  She is still able to walk and has crutches at home that she can use if needed  She is otherwise unhurt.   Patient Active Problem List   Diagnosis Date Noted  . Routine general medical examination at a health care facility 09/05/2011  . Glaucoma 09/05/2011  . HYPERLIPIDEMIA 12/15/2006    Past Medical History  Diagnosis Date  . Hypertension   . Hyperlipidemia   . Glaucoma     left eye    Past Surgical History  Procedure Laterality Date  . No prior surgery      History  Substance Use Topics  . Smoking status: Never Smoker   . Smokeless tobacco: Never Used  . Alcohol Use: No    Family History  Problem Relation Age of Onset  . Hypertension Other   . Diabetes Other   . Colon cancer Paternal Grandmother 48    No Known Allergies  Medication list has been reviewed and updated.  Current Outpatient Prescriptions on File Prior to Visit  Medication Sig Dispense Refill  . atorvastatin (LIPITOR) 20 MG tablet Take 1 tablet (20 mg total) by mouth daily.  90 tablet  3  . bimatoprost (LUMIGAN) 0.03 % ophthalmic solution Place 1 drop into both eyes at bedtime.      . brimonidine-timolol (COMBIGAN) 0.2-0.5 % ophthalmic solution Place 1 drop into both eyes every 12 (twelve) hours.      Marland Kitchen amoxicillin-clavulanate (AUGMENTIN) 875-125 MG per tablet Take 1 tablet by mouth 2 (two) times daily.  20 tablet  0  .  chlorpheniramine-HYDROcodone (TUSSIONEX PENNKINETIC ER) 10-8 MG/5ML LQCR Take 5 mLs by mouth every 12 (twelve) hours as needed.  60 mL  0  . ipratropium (ATROVENT) 0.03 % nasal spray Place 2 sprays into the nose 2 (two) times daily.  30 mL  5  . pseudoephedrine-guaifenesin (MUCINEX D) 60-600 MG per tablet Take 1 tablet by mouth every 12 (twelve) hours.  18 tablet  0   No current facility-administered medications on file prior to visit.    Review of Systems:  As per HPI- otherwise negative.   Physical Examination: Filed Vitals:   04/28/13 1529  BP: 132/82  Pulse: 87  Temp: 98 F (36.7 C)  Resp: 16   Filed Vitals:   04/28/13 1529  Height: 5\' 4"  (1.626 m)  Weight: 201 lb (91.173 kg)   Body mass index is 34.48 kg/(m^2). Ideal Body Weight: Weight in (lb) to have BMI = 25: 145.3  GEN: WDWN, NAD, Non-toxic, A & O x 3, obese, looks well HEENT: Atraumatic, Normocephalic. Neck supple. No masses, No LAD. Ears and Nose: No external deformity. CV: RRR, No M/G/R. No JVD. No thrill. No extra heart sounds. PULM: CTA B, no wheezes, crackles, rhonchi. No retractions. No resp. distress. No accessory muscle use. EXTR: No c/c/e NEURO Normal  gait.  PSYCH: Normally interactive. Conversant. Not depressed or anxious appearing.  Calm demeanor.  Left ankle: tender just under the lateral maleolus.  Minimal swelling, no bruise or heat.  Achilles is intact, knee is negative, no tib/ fib tenderness.    [facility] reading (PRIMARY) by  Dr. Lorelei Pont. Left ankle:negative Left foot: negative  LEFT ANKLE COMPLETE - 3+ VIEW  COMPARISON None.  FINDINGS There is no evidence of fracture, dislocation, or joint effusion. There is no evidence of arthropathy or other focal bone abnormality. Soft tissues are unremarkable.  IMPRESSION Negative.  LEFT FOOT - COMPLETE 3+ VIEW  COMPARISON  None.  FINDINGS  There is no evidence of fracture or dislocation. There is no  evidence of arthropathy or other focal bone  abnormality. Soft  tissues are unremarkable.  IMPRESSION  Negative.  Placed in a sweed-o ankle brace Assessment and Plan: Left ankle sprain - Plan: DG Foot Complete Left, DG Ankle Complete Left  Left ankle pain - Plan: HYDROcodone-acetaminophen (NORCO/VICODIN) 5-325 MG per tablet  Left ankle sprain- treat with sweedo and vicodin as needed. (She reuqested something for pain.)  She will let me know if not better in the next few days.  Ice and elevate, relative rest.  She declines crutches as she has these at home.   Signed Lamar Blinks, MD

## 2013-04-28 NOTE — Patient Instructions (Signed)
Wear your ankle brace as needed for the next 2 or 3 weeks.  Ice your ankle a couple of times a day- elevate as is possible.  I will let you know if the radiologist says anything about your x-rays. You may certainly take ibuprofen or tylenol as needed for your ankle.

## 2013-04-29 ENCOUNTER — Ambulatory Visit (INDEPENDENT_AMBULATORY_CARE_PROVIDER_SITE_OTHER): Payer: Federal, State, Local not specified - PPO | Admitting: Family Medicine

## 2013-04-29 ENCOUNTER — Encounter: Payer: Self-pay | Admitting: Family Medicine

## 2013-04-29 VITALS — BP 128/80 | HR 58 | Temp 98.4°F | Resp 20 | Ht 64.0 in | Wt 204.0 lb

## 2013-04-29 DIAGNOSIS — E785 Hyperlipidemia, unspecified: Secondary | ICD-10-CM

## 2013-04-29 NOTE — Progress Notes (Signed)
   Subjective:    Patient ID: Molly Fuller, female    DOB: 23-Mar-1950, 63 y.o.   MRN: [id number]  HPI Vonda is a 63 year old female who comes in today for followup of hyperlipidemia and an abnormal TSH level  We saw her 4 months ago for general physical examination her total cholesterol was 331. LDL was also elevated. Her TSH level VI.08 although her previous TSH levels were all normal. I felt that was probably a lab error however we repeated a week ago and was indeed normal. On Lipitor 20 mg daily her total cholesterol is dropped from 331-170. HDL 52 LDL 108.  LFTs normal  No muscular joint pain    Review of Systems    review of systems negative Objective:   Physical Exam  Well-developed well-nourished female no acute distress vital signs stable she is afebrile      Assessment & Plan:  Hyperlipidemia goal continue Lipitor 20 mg daily  Normal thyroid level

## 2013-04-29 NOTE — Progress Notes (Signed)
Pre-visit discussion using our clinic review tool. No additional management support is needed unless otherwise documented below in the visit note.  

## 2013-04-29 NOTE — Patient Instructions (Signed)
Continue the Lipitor one tablet daily  Followup in November for your annual complete exam  Labs one week prior

## 2013-07-05 ENCOUNTER — Telehealth: Payer: Self-pay | Admitting: Family Medicine

## 2013-07-05 NOTE — Telephone Encounter (Signed)
Spoke with patient and she does not want to come in to the office.  If no improvement soon she will either make an appointment with Dr Sherren Mocha or go to ortho.

## 2013-07-05 NOTE — Telephone Encounter (Signed)
Attempted to return pt's phone call regarding "Swelling in ankle.'   No answer, left message to call office back if further assistance needed.

## 2013-07-05 NOTE — Telephone Encounter (Signed)
Patient Information:  Caller Name: Krysten  Phone: [phone]  Patient: Molly Fuller, Molly Fuller  Gender: Female  DOB: 1950-08-03  Age: 63 Years  PCP: Stevie Kern [facility])  Office Follow Up:  Does the office need to follow up with this patient?: Yes  Instructions For The Office: Pt has questions about an ankle support she has purchased if Dr Sherren Mocha feels this is ok or what other recommendations he has.  Pt is ok with appt if he feels he needs to see her again.   Symptoms  Reason For Call & Symptoms: March 2015 pt had sprained ankle.  Was seen in [facility] and given a brace.  Pt saw Dr Sherren Mocha a couple of days later and he advised to wear cotton sock and shoe - no brace, take Motrin, ice ankle.   Pt has been on and off foot fairly frequently, tried to wear cotton sock and shoe for the most part but has not been 100% consistent.  The days she is on her foot more often she has swelling and some times to the point it feels numb.  She will ice and elevate it when that occurs which helps.  07/05/13 pt has bought an ankle support and would like to know if Dr Sherren Mocha feels this is ok or if he has other remedies/suggestions.   Pt is having no pain at this time, mild swelling, afebrile.  Advised pt a message would be sent to Dr Sherren Mocha with her questions.  Reviewed Health History In EMR: Yes  Reviewed Medications In EMR: Yes  Reviewed Allergies In EMR: Yes  Reviewed Surgeries / Procedures: Yes  Date of Onset of Symptoms: 04/18/2013  Guideline(s) Used:  Ankle and Foot Injury  Disposition Per Guideline:   See Within 3 Days in Office  Reason For Disposition Reached:   Injury is still painful or swollen after 2 weeks  Advice Given:  N/A  Patient Will Follow Care Advice:  YES

## 2013-09-24 ENCOUNTER — Ambulatory Visit (INDEPENDENT_AMBULATORY_CARE_PROVIDER_SITE_OTHER): Payer: Federal, State, Local not specified - PPO | Admitting: Emergency Medicine

## 2013-09-24 VITALS — BP 122/78 | HR 64 | Temp 98.3°F | Resp 16 | Ht 64.25 in | Wt 194.0 lb

## 2013-09-24 DIAGNOSIS — J3489 Other specified disorders of nose and nasal sinuses: Secondary | ICD-10-CM

## 2013-09-24 DIAGNOSIS — J029 Acute pharyngitis, unspecified: Secondary | ICD-10-CM

## 2013-09-24 DIAGNOSIS — J0100 Acute maxillary sinusitis, unspecified: Secondary | ICD-10-CM

## 2013-09-24 LAB — POCT RAPID STREP A (OFFICE): Rapid Strep A Screen: NEGATIVE

## 2013-09-24 MED ORDER — AZITHROMYCIN 250 MG PO TABS: ORAL_TABLET | ORAL | Status: DC

## 2013-09-24 MED ORDER — BENZONATATE 100 MG PO CAPS: 100.0000 mg | ORAL_CAPSULE | Freq: Three times a day (TID) | ORAL | Status: DC | PRN

## 2013-09-24 NOTE — Progress Notes (Signed)
Subjective:    Patient ID: Clarisse Gouge, female    DOB: 19-Jun-1950, 63 y.o.   MRN: [id number] This chart was scribed for Arlyss Queen, MD by Cathie Hoops, ED Scribe. The patient was seen in Room 8. The patient's care was started at 10:50 AM.   Chief Complaint  Patient presents with  . Cough    since Tuesday--dry cough--chest congestion--some sinis pressure  . Fatigue   Past Medical History  Diagnosis Date  . Hypertension   . Hyperlipidemia   . Glaucoma     left eye   Current Outpatient Prescriptions on File Prior to Visit  Medication Sig Dispense Refill  . atorvastatin (LIPITOR) 20 MG tablet Take 1 tablet (20 mg total) by mouth daily.  90 tablet  3  . bimatoprost (LUMIGAN) 0.03 % ophthalmic solution Place 1 drop into both eyes at bedtime.      . brimonidine-timolol (COMBIGAN) 0.2-0.5 % ophthalmic solution Place 1 drop into both eyes every 12 (twelve) hours.      . dorzolamide (TRUSOPT) 2 % ophthalmic solution Place 1 drop into both eyes 2 (two) times daily.       Marland Kitchen HYDROcodone-acetaminophen (NORCO/VICODIN) 5-325 MG per tablet Take 1 tablet by mouth every 8 (eight) hours as needed.  15 tablet  0  . ipratropium (ATROVENT) 0.03 % nasal spray Place 2 sprays into the nose 2 (two) times daily.  30 mL  5  . pseudoephedrine-guaifenesin (MUCINEX D) 60-600 MG per tablet Take 1 tablet by mouth every 12 (twelve) hours.  18 tablet  0   No current facility-administered medications on file prior to visit.   No Known Allergies  HPI HPI Comments: LOWANA HABLE is a 63 y.o. female who [facility] complaining of new, gradually worsening non-productive cough onset 8/11. Pt reports she has associated sinus and chest congestion, scratchy throat, sinus pressure, fatigue and rhinorrhea. Pt reports her rhinorrhea has resolved but her other symptoms are unresolved. Pt reports she has been around her sick grandson. Pt reports she has seen [facility] three times from  September 2014 with colds or sinus infections. Pt attributes her recent illnesses to her grandson.  Pt denies being a smoker. Pt denies having allergies. Review of Systems  Constitutional: Negative for fever and chills.  HENT: Positive for congestion, rhinorrhea and sinus pressure. Negative for sneezing.   Respiratory: Positive for cough.   Gastrointestinal: Negative for nausea and vomiting.   Objective:   Physical Exam CONSTITUTIONAL: Well developed/well nourished HEAD: Normocephalic/atraumatic EYES: EOMI/PERRL ENMT: Significant nasal congestion.  Throat without exudate. Lungs are clear. Mucous membranes moist NECK: supple no meningeal signs SPINE:entire spine nontender CV: S1/S2 noted, no murmurs/rubs/gallops noted LUNGS: Lungs are clear to auscultation bilaterally, no apparent distress ABDOMEN: soft, nontender, no rebound or guarding GU:no cva tenderness NEURO: Pt is awake/alert, moves all extremitiesx4 EXTREMITIES: pulses normal, full ROM SKIN: warm, color normal PSYCH: no abnormalities of mood noted  Filed Vitals:   09/24/13 0949  BP: 122/78  Pulse: 64  Temp: 98.3 F (36.8 C)  TempSrc: Oral  Resp: 16  Height: 5' 4.25" (1.632 m)  Weight: 194 lb (87.998 kg)  SpO2: 96%   Results for orders placed in visit on 09/24/13  POCT RAPID STREP A (OFFICE)      Result Value Ref Range   Rapid Strep A Screen Negative  Negative    Assessment & Plan:  10:55 AM- Patient informed of current plan for treatment and evaluation  and agrees with plan at this time. We'll treat with Z-Pak and Tessalon Perles

## 2013-11-09 ENCOUNTER — Other Ambulatory Visit: Payer: Self-pay

## 2013-11-09 DIAGNOSIS — Z1231 Encounter for screening mammogram for malignant neoplasm of breast: Secondary | ICD-10-CM

## 2013-11-12 ENCOUNTER — Ambulatory Visit
Admission: RE | Admit: 2013-11-12 | Discharge: 2013-11-12 | Disposition: A | Discharge status: Home / Self Care | Payer: Federal, State, Local not specified - PPO | Source: Ambulatory Visit

## 2013-11-12 DIAGNOSIS — Z1231 Encounter for screening mammogram for malignant neoplasm of breast: Secondary | ICD-10-CM

## 2013-12-30 ENCOUNTER — Other Ambulatory Visit (INDEPENDENT_AMBULATORY_CARE_PROVIDER_SITE_OTHER): Payer: Medicare Other

## 2013-12-30 DIAGNOSIS — D6489 Other specified anemias: Secondary | ICD-10-CM | POA: Diagnosis not present

## 2013-12-30 DIAGNOSIS — E785 Hyperlipidemia, unspecified: Secondary | ICD-10-CM

## 2013-12-30 DIAGNOSIS — Z0000 Encounter for general adult medical examination without abnormal findings: Secondary | ICD-10-CM

## 2013-12-30 LAB — LIPID PANEL
CHOL/HDL RATIO: 4
Cholesterol: 190 mg/dL (ref 0–200)
HDL: 45.7 mg/dL (ref >39.00)
LDL CALC: 132 mg/dL — AB (ref 0–99)
NONHDL: 144.3
Triglycerides: 62 mg/dL (ref 0.0–149.0)
VLDL: 12.4 mg/dL (ref 0.0–40.0)

## 2013-12-30 LAB — CBC WITH DIFFERENTIAL/PLATELET
BASOS PCT: 0.5 % (ref 0.0–3.0)
Basophils Absolute: 0 10*3/uL (ref 0.0–0.1)
Eosinophils Absolute: 0 10*3/uL (ref 0.0–0.7)
Eosinophils Relative: 1.1 % (ref 0.0–5.0)
HCT: 40 % (ref 36.0–46.0)
HEMOGLOBIN: 13.3 g/dL (ref 12.0–15.0)
LYMPHS PCT: 45.2 % (ref 12.0–46.0)
Lymphs Abs: 2 10*3/uL (ref 0.7–4.0)
MCHC: 33.3 g/dL (ref 30.0–36.0)
MCV: 87.1 fl (ref 78.0–100.0)
Monocytes Absolute: 0.3 10*3/uL (ref 0.1–1.0)
Monocytes Relative: 6.1 % (ref 3.0–12.0)
NEUTROS ABS: 2.1 10*3/uL (ref 1.4–7.7)
Neutrophils Relative %: 47.1 % (ref 43.0–77.0)
Platelets: 233 10*3/uL (ref 150.0–400.0)
RBC: 4.59 Mil/uL (ref 3.87–5.11)
RDW: 13.5 % (ref 11.5–15.5)
WBC: 4.5 10*3/uL (ref 4.0–10.5)

## 2013-12-30 LAB — HEPATIC FUNCTION PANEL
ALT: 23 U/L (ref 0–35)
AST: 27 U/L (ref 0–37)
Albumin: 3.3 g/dL — ABNORMAL LOW (ref 3.5–5.2)
Alkaline Phosphatase: 68 U/L (ref 39–117)
Bilirubin, Direct: 0.1 mg/dL (ref 0.0–0.3)
TOTAL PROTEIN: 7.2 g/dL (ref 6.0–8.3)
Total Bilirubin: 1.1 mg/dL (ref 0.2–1.2)

## 2013-12-30 LAB — BASIC METABOLIC PANEL
BUN: 13 mg/dL (ref 6–23)
CHLORIDE: 109 meq/L (ref 96–112)
CO2: 24 mEq/L (ref 19–32)
CREATININE: 0.7 mg/dL (ref 0.4–1.2)
Calcium: 9.3 mg/dL (ref 8.4–10.5)
GFR: 104.53 mL/min (ref >60.00)
Glucose, Bld: 89 mg/dL (ref 70–99)
Potassium: 4.5 mEq/L (ref 3.5–5.1)
Sodium: 140 mEq/L (ref 135–145)

## 2013-12-30 LAB — TSH: TSH: 4.55 u[IU]/mL — ABNORMAL HIGH (ref 0.35–4.50)

## 2014-01-06 ENCOUNTER — Ambulatory Visit (INDEPENDENT_AMBULATORY_CARE_PROVIDER_SITE_OTHER): Payer: Medicare Other | Admitting: Family Medicine

## 2014-01-06 ENCOUNTER — Encounter: Payer: Self-pay | Admitting: Family Medicine

## 2014-01-06 VITALS — BP 110/78 | Temp 97.9°F | Ht 64.75 in | Wt 198.0 lb

## 2014-01-06 DIAGNOSIS — H409 Unspecified glaucoma: Secondary | ICD-10-CM

## 2014-01-06 DIAGNOSIS — E782 Mixed hyperlipidemia: Secondary | ICD-10-CM | POA: Diagnosis not present

## 2014-01-06 DIAGNOSIS — E785 Hyperlipidemia, unspecified: Secondary | ICD-10-CM | POA: Diagnosis not present

## 2014-01-06 MED ORDER — ATORVASTATIN CALCIUM 20 MG PO TABS: 20.0000 mg | ORAL_TABLET | Freq: Every day | ORAL | Status: DC

## 2014-01-06 NOTE — Progress Notes (Signed)
Pre visit review using our clinic review tool, if applicable. No additional management support is needed unless otherwise documented below in the visit note. 

## 2014-01-06 NOTE — Patient Instructions (Signed)
Continue your Lipitor and aspirin daily  Walk 30 minutes daily  Weight lifting twice weekly  Work on your diet,,,,,,,,,, lose 12 pounds in the next 12 months  Return in a year for follow-up sooner if any problems

## 2014-01-06 NOTE — Progress Notes (Signed)
   Subjective:    Patient ID: Molly Fuller, female    DOB: 07/28/1950, 63 y.o.   MRN: [id number]  HPI Molly Fuller is a 63 year old female nonsmoker who comes in today for evaluation of hyperlipidemia  She takes Lipitor 20 mg daily for hyperlipidemia lipids are at goal with an HDL of 46 and an LDL of 132  Rest of her labs normal except for slight elevation TSH 4.55. She is asymptomatic  She gets routine eye care, dental care, BSE monthly, and you mammography, recent colonoscopy 2014 normal  Medicines list reviewed no changes  Immunizations....... after repeated explanations by me and Apolonio Schneiders..... She still declines a flu shot and a Pneumovax  Cognitive function normal she walks daily home health safety reviewed no issues identified, no guns in the house, she does not have a healthcare power of attorney nor living well. Was advised to do so   Review of Systems  Constitutional: Negative.   HENT: Negative.   Eyes: Negative.   Respiratory: Negative.   Cardiovascular: Negative.   Gastrointestinal: Negative.   Endocrine: Negative.   Genitourinary: Negative.   Musculoskeletal: Negative.   Skin: Negative.   Allergic/Immunologic: Negative.   Neurological: Negative.   Hematological: Negative.   Psychiatric/Behavioral: Negative.        Objective:   Physical Exam  Constitutional: She appears well-developed and well-nourished.  HENT:  Head: Normocephalic and atraumatic.  Right Ear: External ear normal.  Left Ear: External ear normal.  Nose: Nose normal.  Mouth/Throat: Oropharynx is clear and moist.  Eyes: EOM are normal. Pupils are equal, round, and reactive to light.  Neck: Normal range of motion. Neck supple. No JVD present. No tracheal deviation present. No thyromegaly present.  Cardiovascular: Normal rate, regular rhythm, normal heart sounds and intact distal pulses.  Exam reveals no gallop and no friction rub.   No murmur heard. No carotid nor aortic bruits peripheral pulses  2+ and symmetrical  Pulmonary/Chest: Effort normal and breath sounds normal. No stridor. No respiratory distress. She has no wheezes. She has no rales. She exhibits no tenderness.  Abdominal: Soft. Bowel sounds are normal. She exhibits no distension and no mass. There is no tenderness. There is no rebound and no guarding.  Genitourinary:  Bilateral breast exam normal  Pelvic and Pap last year and all her previous Paps have been normal. Therefore recommend every 3 years.  Musculoskeletal: Normal range of motion.  Lymphadenopathy:    She has no cervical adenopathy.  Neurological: She is alert. She has normal reflexes. No cranial nerve deficit. She exhibits normal muscle tone. Coordination normal.  Skin: Skin is warm and dry. No rash noted. No erythema. No pallor.  Psychiatric: She has a normal mood and affect. Her behavior is normal. Judgment and thought content normal.  Nursing note and vitals reviewed.         Assessment & Plan:  Healthy female  Hyperlipidemia,,,,,, continue Lipitor  Overweight,,,,,,,,,, again discussed diet exercise and weight loss  Glaucoma,,,,,,, followed by ophthalmology,

## 2014-03-03 DIAGNOSIS — H4011X3 Primary open-angle glaucoma, severe stage: Secondary | ICD-10-CM | POA: Diagnosis not present

## 2014-03-03 DIAGNOSIS — H2513 Age-related nuclear cataract, bilateral: Secondary | ICD-10-CM | POA: Diagnosis not present

## 2014-06-02 DIAGNOSIS — H4011X3 Primary open-angle glaucoma, severe stage: Secondary | ICD-10-CM | POA: Diagnosis not present

## 2014-06-02 DIAGNOSIS — H2513 Age-related nuclear cataract, bilateral: Secondary | ICD-10-CM | POA: Diagnosis not present

## 2014-10-04 DIAGNOSIS — H4011X3 Primary open-angle glaucoma, severe stage: Secondary | ICD-10-CM | POA: Diagnosis not present

## 2014-12-27 ENCOUNTER — Other Ambulatory Visit: Payer: Self-pay

## 2014-12-27 DIAGNOSIS — Z1231 Encounter for screening mammogram for malignant neoplasm of breast: Secondary | ICD-10-CM

## 2015-01-04 ENCOUNTER — Other Ambulatory Visit (INDEPENDENT_AMBULATORY_CARE_PROVIDER_SITE_OTHER): Payer: Medicare Other

## 2015-01-04 DIAGNOSIS — D649 Anemia, unspecified: Secondary | ICD-10-CM | POA: Diagnosis not present

## 2015-01-04 DIAGNOSIS — E785 Hyperlipidemia, unspecified: Secondary | ICD-10-CM

## 2015-01-04 DIAGNOSIS — I10 Essential (primary) hypertension: Secondary | ICD-10-CM | POA: Diagnosis not present

## 2015-01-04 DIAGNOSIS — R300 Dysuria: Secondary | ICD-10-CM

## 2015-01-04 DIAGNOSIS — E039 Hypothyroidism, unspecified: Secondary | ICD-10-CM

## 2015-01-04 LAB — CBC WITH DIFFERENTIAL/PLATELET
BASOS PCT: 0.4 % (ref 0.0–3.0)
Basophils Absolute: 0 10*3/uL (ref 0.0–0.1)
EOS PCT: 1.6 % (ref 0.0–5.0)
Eosinophils Absolute: 0.1 10*3/uL (ref 0.0–0.7)
HEMATOCRIT: 42.5 % (ref 36.0–46.0)
HEMOGLOBIN: 14.3 g/dL (ref 12.0–15.0)
LYMPHS PCT: 43.3 % (ref 12.0–46.0)
Lymphs Abs: 2.2 10*3/uL (ref 0.7–4.0)
MCHC: 33.7 g/dL (ref 30.0–36.0)
MCV: 85.7 fl (ref 78.0–100.0)
MONO ABS: 0.4 10*3/uL (ref 0.1–1.0)
MONOS PCT: 7.3 % (ref 3.0–12.0)
Neutro Abs: 2.4 10*3/uL (ref 1.4–7.7)
Neutrophils Relative %: 47.4 % (ref 43.0–77.0)
Platelets: 247 10*3/uL (ref 150.0–400.0)
RBC: 4.96 Mil/uL (ref 3.87–5.11)
RDW: 13 % (ref 11.5–15.5)
WBC: 5 10*3/uL (ref 4.0–10.5)

## 2015-01-04 LAB — BASIC METABOLIC PANEL
BUN: 13 mg/dL (ref 6–23)
CALCIUM: 9.4 mg/dL (ref 8.4–10.5)
CO2: 28 mEq/L (ref 19–32)
Chloride: 107 mEq/L (ref 96–112)
Creatinine, Ser: 0.71 mg/dL (ref 0.40–1.20)
GFR: 105.9 mL/min (ref >60.00)
GLUCOSE: 87 mg/dL (ref 70–99)
Potassium: 4.6 mEq/L (ref 3.5–5.1)
SODIUM: 141 meq/L (ref 135–145)

## 2015-01-04 LAB — LIPID PANEL
CHOL/HDL RATIO: 3
Cholesterol: 163 mg/dL (ref 0–200)
HDL: 55 mg/dL (ref >39.00)
LDL Cholesterol: 96 mg/dL (ref 0–99)
NONHDL: 108.33
Triglycerides: 62 mg/dL (ref 0.0–149.0)
VLDL: 12.4 mg/dL (ref 0.0–40.0)

## 2015-01-04 LAB — POCT URINALYSIS DIPSTICK
BILIRUBIN UA: NEGATIVE
Glucose, UA: NEGATIVE
KETONES UA: NEGATIVE
Nitrite, UA: NEGATIVE
PH UA: 5.5
Protein, UA: NEGATIVE
Urobilinogen, UA: 0.2

## 2015-01-04 LAB — HEPATIC FUNCTION PANEL
ALT: 17 U/L (ref 0–35)
AST: 20 U/L (ref 0–37)
Albumin: 4 g/dL (ref 3.5–5.2)
Alkaline Phosphatase: 67 U/L (ref 39–117)
BILIRUBIN TOTAL: 0.8 mg/dL (ref 0.2–1.2)
Bilirubin, Direct: 0.2 mg/dL (ref 0.0–0.3)
TOTAL PROTEIN: 7.4 g/dL (ref 6.0–8.3)

## 2015-01-04 LAB — TSH: TSH: 3.19 u[IU]/mL (ref 0.35–4.50)

## 2015-01-09 ENCOUNTER — Encounter: Payer: Self-pay | Admitting: Family Medicine

## 2015-01-09 ENCOUNTER — Ambulatory Visit (INDEPENDENT_AMBULATORY_CARE_PROVIDER_SITE_OTHER): Payer: Medicare Other | Admitting: Family Medicine

## 2015-01-09 VITALS — BP 110/80 | Temp 98.0°F | Ht 64.0 in | Wt 212.0 lb

## 2015-01-09 DIAGNOSIS — E785 Hyperlipidemia, unspecified: Secondary | ICD-10-CM | POA: Diagnosis not present

## 2015-01-09 DIAGNOSIS — E669 Obesity, unspecified: Secondary | ICD-10-CM | POA: Insufficient documentation

## 2015-01-09 DIAGNOSIS — Z6830 Body mass index (BMI) 30.0-30.9, adult: Secondary | ICD-10-CM

## 2015-01-09 DIAGNOSIS — Z0000 Encounter for general adult medical examination without abnormal findings: Secondary | ICD-10-CM

## 2015-01-09 DIAGNOSIS — E782 Mixed hyperlipidemia: Secondary | ICD-10-CM

## 2015-01-09 DIAGNOSIS — H409 Unspecified glaucoma: Secondary | ICD-10-CM | POA: Diagnosis not present

## 2015-01-09 MED ORDER — ATORVASTATIN CALCIUM 20 MG PO TABS: 20.0000 mg | ORAL_TABLET | Freq: Every day | ORAL | Status: DC

## 2015-01-09 NOTE — Progress Notes (Signed)
Pre visit review using our clinic review tool, if applicable. No additional management support is needed unless otherwise documented below in the visit note. 

## 2015-01-09 NOTE — Patient Instructions (Signed)
Continue the Lipitor and a baby aspirin daily  Begin a dietary program. He weight last year was 198 pounds. 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 carbohydrate free diet, walk 30 minutes daily  Return in one year for general physical examination........ Georgina Snell........ Almyra Free.......Marland Kitchen or Dr. [place]........: August for your physical examination in November

## 2015-01-09 NOTE — Progress Notes (Signed)
   Subjective:    Patient ID: Molly Fuller, female    DOB: 03/14/1950, 64 y.o.   MRN: [id number]  HPI Molly Fuller is a 64 year old married female nonsmoker who comes in today for general physical examination because of a history of hyperlipidemia and obesity  Weight last year was 198 pounds now she's 212. She is not dieting she's not exercising  She takes Lipitor 40 mg daily for hyperlipidemia. LFTs normal LDL 96 total cholesterol 163 HDL 55  She gets routine eye care, dental care, BSE monthly, annual mammography. Last mammogram was September 2015 she tells she has appointment next week for annual mammogram.  We went through our vaccinations. She declines a flu shot she declines a shingles vaccine she disc lines a Pneumovax. We spent a lot of time explaining importance of vaccinations. She still declines  She has glaucoma and sees her eye doctor on a regular basis  Cognitive function normal....... she does not exercise on a regular basis, home health safety reviewed no issues identified, no guns in the house, she does have a healthcare power of attorney and living well   Review of Systems  Constitutional: Negative.   HENT: Negative.   Eyes: Negative.   Respiratory: Negative.   Cardiovascular: Negative.   Gastrointestinal: Negative.   Endocrine: Negative.   Genitourinary: Negative.   Musculoskeletal: Negative.   Skin: Negative.   Allergic/Immunologic: Negative.   Neurological: Negative.   Hematological: Negative.   Psychiatric/Behavioral: Negative.        Objective:   Physical Exam  Constitutional: She appears well-developed and well-nourished.  HENT:  Head: Normocephalic and atraumatic.  Right Ear: External ear normal.  Left Ear: External ear normal.  Nose: Nose normal.  Mouth/Throat: Oropharynx is clear and moist.  Eyes: EOM are normal. Pupils are equal, round, and reactive to light.  Neck: Normal range of motion. Neck supple. No JVD present. No tracheal deviation  present. No thyromegaly present.  Cardiovascular: Normal rate, regular rhythm, normal heart sounds and intact distal pulses.  Exam reveals no gallop and no friction rub.   No murmur heard. Pulmonary/Chest: Effort normal and breath sounds normal. No stridor. No respiratory distress. She has no wheezes. She has no rales. She exhibits no tenderness.  Abdominal: Soft. Bowel sounds are normal. She exhibits no distension and no mass. There is no tenderness. There is no rebound and no guarding.  Genitourinary:  Bilateral breast exam normal  Pelvic and Pap last year normal therefore not repeated  Musculoskeletal: Normal range of motion.  Lymphadenopathy:    She has no cervical adenopathy.  Neurological: She is alert. She has normal reflexes. No cranial nerve deficit. She exhibits normal muscle tone. Coordination normal.  Skin: Skin is warm and dry. No rash noted. No erythema. No pallor.  Psychiatric: She has a normal mood and affect. Her behavior is normal. Judgment and thought content normal.          Assessment & Plan:  Healthy female  Obesity......... discussed diet exercise and weight loss  Hyperlipidemia goal........ continue Lipitor  Glaucoma........ continue eyedrops and followed by ophthalmology

## 2015-01-18 ENCOUNTER — Ambulatory Visit
Admission: RE | Admit: 2015-01-18 | Discharge: 2015-01-18 | Disposition: A | Discharge status: Home / Self Care | Payer: Medicare Other | Source: Ambulatory Visit

## 2015-01-18 DIAGNOSIS — Z1231 Encounter for screening mammogram for malignant neoplasm of breast: Secondary | ICD-10-CM

## 2015-02-22 DIAGNOSIS — H401133 Primary open-angle glaucoma, bilateral, severe stage: Secondary | ICD-10-CM | POA: Diagnosis not present

## 2015-03-09 DIAGNOSIS — H401133 Primary open-angle glaucoma, bilateral, severe stage: Secondary | ICD-10-CM | POA: Diagnosis not present

## 2015-04-01 ENCOUNTER — Ambulatory Visit (INDEPENDENT_AMBULATORY_CARE_PROVIDER_SITE_OTHER): Payer: Medicare Other | Admitting: Family Medicine

## 2015-04-01 VITALS — BP 120/70 | HR 71 | Temp 99.4°F | Resp 20 | Ht 66.0 in | Wt 204.4 lb

## 2015-04-01 DIAGNOSIS — R509 Fever, unspecified: Secondary | ICD-10-CM

## 2015-04-01 DIAGNOSIS — R52 Pain, unspecified: Secondary | ICD-10-CM | POA: Diagnosis not present

## 2015-04-01 DIAGNOSIS — R05 Cough: Secondary | ICD-10-CM

## 2015-04-01 DIAGNOSIS — J101 Influenza due to other identified influenza virus with other respiratory manifestations: Secondary | ICD-10-CM

## 2015-04-01 DIAGNOSIS — R059 Cough, unspecified: Secondary | ICD-10-CM

## 2015-04-01 LAB — POCT INFLUENZA A/B
Influenza A, POC: NEGATIVE
Influenza B, POC: POSITIVE — AB

## 2015-04-01 MED ORDER — BENZONATATE 100 MG PO CAPS: 100.0000 mg | ORAL_CAPSULE | Freq: Three times a day (TID) | ORAL | Status: DC | PRN

## 2015-04-01 MED ORDER — OSELTAMIVIR PHOSPHATE 75 MG PO CAPS: 75.0000 mg | ORAL_CAPSULE | Freq: Two times a day (BID) | ORAL | Status: DC

## 2015-04-01 NOTE — Patient Instructions (Signed)
You do have the flu Rest and drink plenty of liquids Use the tamiflu as directed and the tessalon perles as needed for cough Ibuprofen and tylenol will help with your aches and fevers Let me know if you are not improving in the next few days- Sooner if worse.

## 2015-04-01 NOTE — Progress Notes (Signed)
[facility] [address], [place] [address] [phone]  Date:  04/01/2015   Name:  Molly Fuller   DOB:  02/26/1950   MRN:  [id number]  PCP:  Joycelyn Man, MD    Chief Complaint: Cough; Fatigue; Fever; and Sore Throat   History of Present Illness:  Molly Fuller is a 65 y.o. very pleasant female patient who presents with the following:  About 3 days ago she awoke with an "unproductive hacking cough," she has noted a low grade fever and fatigue.  She has had some body aches, headache, chest burns when she coughs.    Her nose is running just today She does have glaucoma Temp up to the 99s No ST No GI symptoms She used theraflu yesterday, nothing so far today  Patient Active Problem List   Diagnosis Date Noted  . Obesity 01/09/2015  . Routine general medical examination at a health care facility 09/05/2011  . Glaucoma 09/05/2011  . Hyperlipidemia 12/15/2006    Past Medical History  Diagnosis Date  . Hypertension   . Hyperlipidemia   . Glaucoma     left eye    Past Surgical History  Procedure Laterality Date  . No prior surgery      Social History  Substance Use Topics  . Smoking status: Never Smoker   . Smokeless tobacco: Never Used  . Alcohol Use: No    Family History  Problem Relation Age of Onset  . Hypertension Other   . Diabetes Other   . Colon cancer Paternal Grandmother 69    No Known Allergies  Medication list has been reviewed and updated.  Current Outpatient Prescriptions on File Prior to Visit  Medication Sig Dispense Refill  . atorvastatin (LIPITOR) 20 MG tablet Take 1 tablet (20 mg total) by mouth daily. 90 tablet 3  . bimatoprost (LUMIGAN) 0.03 % ophthalmic solution Place 1 drop into both eyes at bedtime.    . brimonidine-timolol (COMBIGAN) 0.2-0.5 % ophthalmic solution Place 1 drop into both eyes every 12 (twelve) hours.     No current facility-administered medications on file prior to visit.     Review of Systems:  As per HPI- otherwise negative.   Physical Examination: Filed Vitals:   04/01/15 1047  BP: 120/70  Pulse: 71  Temp: 99.4 F (37.4 C)  Resp: 20   Filed Vitals:   04/01/15 1047  Height: 5\' 6"  (1.676 m)  Weight: 204 lb 6.4 oz (92.715 kg)   Body mass index is 33.01 kg/(m^2). Ideal Body Weight: Weight in (lb) to have BMI = 25: 154.6  GEN: WDWN, NAD, Non-toxic, A & O x 3, overweight, looks well HEENT: Atraumatic, Normocephalic. Neck supple. No masses, No LAD.  Bilateral TM wnl, oropharynx normal.  PEERL,EOMI.   Ears and Nose: No external deformity. CV: RRR, No M/G/R. No JVD. No thrill. No extra heart sounds. PULM: CTA B, no wheezes, crackles, rhonchi. No retractions. No resp. distress. No accessory muscle use. EXTR: No c/c/e NEURO Normal gait.  PSYCH: Normally interactive. Conversant. Not depressed or anxious appearing.  Calm demeanor.   Results for orders placed or performed in visit on 04/01/15  POCT Influenza A/B  Result Value Ref Range   Influenza A, POC Negative Negative   Influenza B, POC Positive (A) Negative    Assessment and Plan: Influenza B - Plan: oseltamivir (TAMIFLU) 75 MG capsule  Low grade fever - Plan: POCT Influenza A/B  Body aches - Plan: POCT Influenza  A/B  Cough - Plan: benzonatate (TESSALON) 100 MG capsule    Signed Lamar Blinks, MD

## 2015-07-25 DIAGNOSIS — H2513 Age-related nuclear cataract, bilateral: Secondary | ICD-10-CM | POA: Diagnosis not present

## 2015-07-25 DIAGNOSIS — H401133 Primary open-angle glaucoma, bilateral, severe stage: Secondary | ICD-10-CM | POA: Diagnosis not present

## 2015-09-07 ENCOUNTER — Ambulatory Visit (INDEPENDENT_AMBULATORY_CARE_PROVIDER_SITE_OTHER): Payer: Federal, State, Local not specified - PPO | Admitting: Family Medicine

## 2015-09-07 VITALS — BP 137/86 | HR 68 | Temp 98.3°F | Resp 16 | Ht 66.0 in | Wt 190.2 lb

## 2015-09-07 DIAGNOSIS — R220 Localized swelling, mass and lump, head: Secondary | ICD-10-CM

## 2015-09-07 DIAGNOSIS — T63461A Toxic effect of venom of wasps, accidental (unintentional), initial encounter: Secondary | ICD-10-CM | POA: Diagnosis not present

## 2015-09-07 DIAGNOSIS — T7840XA Allergy, unspecified, initial encounter: Secondary | ICD-10-CM | POA: Diagnosis not present

## 2015-09-07 MED ORDER — PREDNISONE 20 MG PO TABS: 40.0000 mg | ORAL_TABLET | Freq: Every day | ORAL | Status: DC

## 2015-09-07 MED ORDER — EPINEPHRINE 0.3 MG/0.3ML IJ SOAJ: 0.3000 mg | Freq: Once | INTRAMUSCULAR | Status: DC

## 2015-09-07 MED ORDER — METHYLPREDNISOLONE SODIUM SUCC 125 MG IJ SOLR
125.0000 mg | Freq: Once | INTRAMUSCULAR | Status: AC
  Administered 2015-09-07: 125 mg via INTRAMUSCULAR

## 2015-09-07 MED ORDER — RANITIDINE HCL 150 MG PO TABS
150.0000 mg | ORAL_TABLET | Freq: Once | ORAL | Status: AC
  Administered 2015-09-07: 150 mg via ORAL

## 2015-09-07 MED ORDER — DIPHENHYDRAMINE HCL 50 MG/ML IJ SOLN
50.0000 mg | Freq: Once | INTRAMUSCULAR | Status: AC
  Administered 2015-09-07: 50 mg via INTRAMUSCULAR

## 2015-09-07 NOTE — Patient Instructions (Addendum)
Your symptoms improved in the office with Benadryl. I suspect this should continue to improve into tonight and tomorrow.  Continue Benadryl 25-50 mg every 4-6 hours today and tomorrow, then we can change over after 24 hours to Zyrtec 10 mg once a day for 5 days.  You were also given Zantac 150 mg once here today. Continue Zantac 150 mg twice per day for the next 5 days.  A steroid injection was given in the office, but you can also take prednisone 2 pills tomorrow and 2 pills on Saturday to lessen recurrence of reaction.  See information on EpiPen if this is needed. As we discussed, if you do need use the EpiPen, it is not the ultimate treatment for the reaction, she you will need to be seen immediately in an emergency care facility if you do have to use that medication.  I would also like to refer you to an allergist to decide if any immune treatment or other treatments recommended with this wasp allergy.  Return to the clinic or go to the nearest emergency room if any of your symptoms worsen or new symptoms occur.   Bee, Wasp, or [employer], wasps, and hornets are part of a family of insects that can sting people. These stings can cause pain and inflammation, but they are usually not serious. However, some people may have an allergic reaction to a sting. This can cause the symptoms to be more severe.  SYMPTOMS  Common symptoms of this condition include:   A red lump in the skin that sometimes has a tiny hole in the center. In some cases, a stinger may be in the center of the wound.  Pain and itching at the sting site.  Redness and swelling around the sting site. If you have an allergic reaction (localized allergic reaction), the swelling and redness may spread out from the sting site. In some cases, this reaction can continue to develop over the next 12-36 hours. In rare cases, a person may have a severe allergic reaction (anaphylactic reaction) to a sting. Symptoms of an  anaphylactic reaction may include:   Wheezing or difficulty breathing.  Raised, itchy, red patches on the skin.  Nausea or vomiting.  Abdominal cramping.  Diarrhea.  Chest pain.  Fainting.  Redness of the face (flushing). DIAGNOSIS  This condition is usually diagnosed based on symptoms, medical history, and a physical exam. TREATMENT  Most stings can be treated with:   Icing to reduce swelling.  Medicines (antihistamines) to treat itching or an allergic reaction.  Medicines to help reduce pain. These may be medicines that you take by mouth, or medicated creams or lotions that you apply to your skin. If you were stung by a bee, the stinger and a small sac of poison may be in the wound. This may be removed by brushing across it with a flat card, such as a credit card. Another method is to pinch the area and pull it out. These methods can help reduce the severity of the body's reaction to the sting.  HOME CARE INSTRUCTIONS   Wash the sting site daily with soap and water as told by your health care provider.  Apply or take over-the-counter and prescription medicines only as told by your health care provider.  If directed, apply ice to the sting area.  Put ice in a plastic bag.  Place a towel between your skin and the bag.  Leave the ice on for 20 minutes, 2-3 times  per day.  Do not scratch the sting area.  To lessen pain, try using a paste that is made of water and baking soda. Rub the paste on the sting area and leave it on for 5 minutes.  If you had a severe allergic reaction to a sting, you may need:  To wear a medical bracelet or necklace that lists the allergy.  To learn when and how to use an anaphylaxis kit or epinephrine injection. Your family members may also need to learn this.  To carry an anaphylaxis kit with you at all times. SEEK MEDICAL CARE IF:   Your symptoms do not get better in 2-3 days.  You have redness, swelling, or pain that spreads  beyond the area of the sting.  You have a fever. SEEK IMMEDIATE MEDICAL CARE IF:  You have symptoms of a severe allergic reaction. These include:   Wheezing or difficulty breathing.  Chest pain.  Light-headedness or fainting.  Itchy, raised, red patches on the skin.  Nausea or vomiting.  Abdominal cramping.  Diarrhea.   This information is not intended to replace advice given to you by your health care provider. Make sure you discuss any questions you have with your health care provider.   Document Released: 02/04/2005 Document Revised: 10/26/2014 Document Reviewed: 06/22/2014 Elsevier Interactive Patient Education 2016 [employer].   IF you received an x-ray today, you will receive an invoice from [facility]. Please contact [facility] at [phone] with questions or concerns regarding your invoice.   IF you received labwork today, you will receive an invoice from [employer]. Please contact [facility] at [phone] with questions or concerns regarding your invoice.   Our billing staff will not be able to assist you with questions regarding bills from these companies.  You will be contacted with the lab results as soon as they are available. The fastest way to get your results is to activate your My Chart account. Instructions are located on the last page of this paperwork. If you have not heard from [place] regarding the results in 2 weeks, please contact this office.

## 2015-09-07 NOTE — Progress Notes (Addendum)
By signing my name below, I, Molly Fuller, attest that this documentation has been prepared under the direction and in the presence of Molly Ray, MD.  Electronically Signed: Verlee Fuller, Medical Scribe. 09/07/2015. 6:04 PM.  Authored by Molly Forehand, MD. Unable to change in [facility].   Subjective:    Patient ID: Molly Fuller, female    DOB: 02/27/1950, 65 y.o.   MRN: [id number]  HPI Chief Complaint  Patient presents with   Insect Bite    bee sting happen about 30-45 mins ago, right knee, lips and leg swelling with hives and itching     HPI Comments: Molly Fuller is a 65 y.o. female with a PMHx of HLD and glaucoma who [facility] to the [facility] complaining of wasp sting onset about 45-60 mins ago. Pt reports associated symptoms of right upper lip swelling that occured 5-10 mins after the bite, and itching all over. Pt denies taking any medication for her symptoms. Pt has never had an allergic reaction to a wasp in the past. Pt denies SOB or throat sx's. No lesions in mouth. No chest pain, no trouble swallowing, no lightheadedness/dizziness or wheezing.  Patient Active Problem List   Diagnosis Date Noted   Obesity 01/09/2015   Routine general medical examination at a health care facility 09/05/2011   Glaucoma 09/05/2011   Hyperlipidemia 12/15/2006   Past Medical History  Diagnosis Date   Hypertension    Hyperlipidemia    Glaucoma     left eye   Past Surgical History  Procedure Laterality Date   No prior surgery     No Known Allergies Prior to Admission medications   Medication Sig Start Date End Date Taking? Authorizing Provider  atorvastatin (LIPITOR) 20 MG tablet Take 1 tablet (20 mg total) by mouth daily. 01/09/15 01/09/16 Yes Dorena Cookey, MD  bimatoprost (LUMIGAN) 0.03 % ophthalmic solution Place 1 drop into both eyes at bedtime.   Yes Historical Provider, MD  brimonidine-timolol (COMBIGAN) 0.2-0.5 % ophthalmic solution Place 1  drop into both eyes every 12 (twelve) hours.   Yes Historical Provider, MD   Social History   Social History   Marital Status: Single    Spouse Name: N/A   Number of Children: N/A   Years of Education: N/A   Occupational History   Not on file.   Social History Main Topics   Smoking status: Never Smoker    Smokeless tobacco: Never Used   Alcohol Use: No   Drug Use: No   Sexual Activity: Not on file   Other Topics Concern   Not on file   Social History Narrative   Review of Systems  HENT: Positive for trouble swallowing.   Respiratory: Negative for shortness of breath and wheezing.     Objective:  BP 134/80 mmHg   Pulse 79   Temp(Src) 98.3 F (36.8 C) (Oral)   Resp 16   Ht 5' 6"  (1.676 m)   Wt 190 lb 3.2 oz (86.274 kg)   BMI 30.71 kg/m2   SpO2 98%   LMP 02/19/2000  Physical Exam  Constitutional: She appears well-developed and well-nourished. No distress.  HENT:  Head: Normocephalic and atraumatic.  Mouth/Throat: No oral lesions.  swelling on right upper lip No tongue swelling  Eyes: Conjunctivae are normal.  Neck: Neck supple.  Cardiovascular: Normal rate, regular rhythm and normal heart sounds.  Exam reveals no gallop and no friction rub.   No murmur heard. Pulmonary/Chest: Effort normal and  breath sounds normal. No respiratory distress. She has no wheezes. She has no rales.  Neurological: She is alert.  Skin: Skin is warm and dry.  Diffuse hives on upper back, upper legs, upper arms  Psychiatric: She has a normal mood and affect. Her behavior is normal.  Nursing note and vitals reviewed.  [1805] Benadryl 50 mg IM, Zantac 150 mg PO  6:38 PM Less itching, feeling slightly improved. No wheezing, no stridor on exam. Erythema and areas of urticaria has faded, but still some faint urticaria. No apparent change to swelling of right lip, if not slightly decreased. No intraoral lesions or tongue swelling, airway clear, clearing secretions without  difficulty.  Filed Vitals:   09/07/15 1745 09/07/15 1820 09/07/15 1836  BP: 134/80 141/85 137/86  Pulse: 79 61 68  Temp: 98.3 F (36.8 C)    TempSrc: Oral    Resp: 16    Height: 5' 6"  (1.676 m)    Weight: 190 lb 3.2 oz (86.274 kg)    SpO2: 98% 97% 97%   Evaluated again after approximately 20-30 minutes. Erythema had nearly faded, no apparent significant swelling of lips, clearing airways and breathing normally. Denies further itching or new symptoms. BP stable as above. Decided on discharge home as stable.  Assessment & Plan:   Molly Fuller is a 65 y.o. female Allergic reaction, initial encounter - Plan: diphenhydrAMINE (BENADRYL) injection 50 mg, ranitidine (ZANTAC) tablet 150 mg, methylPREDNISolone sodium succinate (SOLU-MEDROL) 125 mg/2 mL injection 125 mg, predniSONE (DELTASONE) 20 MG tablet, EPINEPHrine (EPIPEN 2-PAK) 0.3 mg/0.3 mL IJ SOAJ injection, Ambulatory referral to Allergy  Wasp sting, accidental or unintentional, initial encounter - Plan: diphenhydrAMINE (BENADRYL) injection 50 mg, ranitidine (ZANTAC) tablet 150 mg, methylPREDNISolone sodium succinate (SOLU-MEDROL) 125 mg/2 mL injection 125 mg, EPINEPHrine (EPIPEN 2-PAK) 0.3 mg/0.3 mL IJ SOAJ injection  Swelling of upper lip - Plan: diphenhydrAMINE (BENADRYL) injection 50 mg, ranitidine (ZANTAC) tablet 150 mg, methylPREDNISolone sodium succinate (SOLU-MEDROL) 125 mg/2 mL injection 125 mg  Wasp sting with systemic symptoms. Appears to only affect cutaneous system. No other vascular compromise or systems apparently involved.  -50 mg Benadryl IM given, Zantac 150 milligram by mouth given, and Solu-Medrol 125 mg IM given to lessen chance of late phase reaction.  -Continue Benadryl 25-27m every 4-6 hours for first 24 hours, they can change to Zyrtec 10 mg daily for 5 days. SED.   -continue Zantac 150 milligrams twice a day.  -Prescription for EpiPen given and teaching on correct technique was done. Handout given.  -refer  to allergy to determine if immunotherapy option to lessen chance of reaction in the future.  -RTC/ER precautions were discussed  Meds ordered this encounter  Medications   diphenhydrAMINE (BENADRYL) injection 50 mg    Sig:    ranitidine (ZANTAC) tablet 150 mg    Sig:    methylPREDNISolone sodium succinate (SOLU-MEDROL) 125 mg/2 mL injection 125 mg    Sig:    predniSONE (DELTASONE) 20 MG tablet    Sig: Take 2 tablets (40 mg total) by mouth daily with breakfast.    Dispense:  4 tablet    Refill:  0   EPINEPHrine (EPIPEN 2-PAK) 0.3 mg/0.3 mL IJ SOAJ injection    Sig: Inject 0.3 mLs (0.3 mg total) into the muscle once.    Dispense:  2 Device    Refill:  0   Patient Instructions     Your symptoms improved in the office with Benadryl. I suspect this should continue to improve into  tonight and tomorrow.  Continue Benadryl 25-50 mg every 4-6 hours today and tomorrow, then we can change over after 24 hours to Zyrtec 10 mg once a day for 5 days.  You were also given Zantac 150 mg once here today. Continue Zantac 150 mg twice per day for the next 5 days.  A steroid injection was given in the office, but you can also take prednisone 2 pills tomorrow and 2 pills on Saturday to lessen recurrence of reaction.  See information on EpiPen if this is needed. As we discussed, if you do need use the EpiPen, it is not the ultimate treatment for the reaction, she you will need to be seen immediately in an emergency care facility if you do have to use that medication.  I would also like to refer you to an allergist to decide if any immune treatment or other treatments recommended with this wasp allergy.  Return to the clinic or go to the nearest emergency room if any of your symptoms worsen or new symptoms occur.   Bee, Wasp, or [employer], wasps, and hornets are part of a family of insects that can sting people. These stings can cause pain and inflammation, but they are usually not serious.  However, some people may have an allergic reaction to a sting. This can cause the symptoms to be more severe.  SYMPTOMS  Common symptoms of this condition include:   A red lump in the skin that sometimes has a tiny hole in the center. In some cases, a stinger may be in the center of the wound.  Pain and itching at the sting site.  Redness and swelling around the sting site. If you have an allergic reaction (localized allergic reaction), the swelling and redness may spread out from the sting site. In some cases, this reaction can continue to develop over the next 12-36 hours. In rare cases, a person may have a severe allergic reaction (anaphylactic reaction) to a sting. Symptoms of an anaphylactic reaction may include:   Wheezing or difficulty breathing.  Raised, itchy, red patches on the skin.  Nausea or vomiting.  Abdominal cramping.  Diarrhea.  Chest pain.  Fainting.  Redness of the face (flushing). DIAGNOSIS  This condition is usually diagnosed based on symptoms, medical history, and a physical exam. TREATMENT  Most stings can be treated with:   Icing to reduce swelling.  Medicines (antihistamines) to treat itching or an allergic reaction.  Medicines to help reduce pain. These may be medicines that you take by mouth, or medicated creams or lotions that you apply to your skin. If you were stung by a bee, the stinger and a small sac of poison may be in the wound. This may be removed by brushing across it with a flat card, such as a credit card. Another method is to pinch the area and pull it out. These methods can help reduce the severity of the body's reaction to the sting.  HOME CARE INSTRUCTIONS   Wash the sting site daily with soap and water as told by your health care provider.  Apply or take over-the-counter and prescription medicines only as told by your health care provider.  If directed, apply ice to the sting area.  Put ice in a plastic bag.  Place a towel  between your skin and the bag.  Leave the ice on for 20 minutes, 2-3 times per day.  Do not scratch the sting area.  To lessen pain, try using a  paste that is made of water and baking soda. Rub the paste on the sting area and leave it on for 5 minutes.  If you had a severe allergic reaction to a sting, you may need:  To wear a medical bracelet or necklace that lists the allergy.  To learn when and how to use an anaphylaxis kit or epinephrine injection. Your family members may also need to learn this.  To carry an anaphylaxis kit with you at all times. SEEK MEDICAL CARE IF:   Your symptoms do not get better in 2-3 days.  You have redness, swelling, or pain that spreads beyond the area of the sting.  You have a fever. SEEK IMMEDIATE MEDICAL CARE IF:  You have symptoms of a severe allergic reaction. These include:   Wheezing or difficulty breathing.  Chest pain.  Light-headedness or fainting.  Itchy, raised, red patches on the skin.  Nausea or vomiting.  Abdominal cramping.  Diarrhea.   This information is not intended to replace advice given to you by your health care provider. Make sure you discuss any questions you have with your health care provider.   Document Released: 02/04/2005 Document Revised: 10/26/2014 Document Reviewed: 06/22/2014 Elsevier Interactive Patient Education 2016 [employer].   IF you received an x-Fuller today, you will receive an invoice from [facility]. Please contact [facility] at [phone] with questions or concerns regarding your invoice.   IF you received labwork today, you will receive an invoice from [employer]. Please contact [facility] at [phone] with questions or concerns regarding your invoice.   Our billing staff will not be able to assist you with questions regarding bills from these companies.  You will be contacted with the lab results as soon as they are available. The  fastest way to get your results is to activate your My Chart account. Instructions are located on the last page of this paperwork. If you have not heard from [place] regarding the results in 2 weeks, please contact this office.         I personally performed the services described in this documentation, which was scribed in my presence. The recorded information has been reviewed and considered, and addended by me as needed.   Signed,   Molly Ray, MD [facility].  09/08/2015 2:30 PM

## 2015-09-19 ENCOUNTER — Ambulatory Visit: Payer: Medicare Other | Admitting: Allergy and Immunology

## 2015-11-21 DIAGNOSIS — H401133 Primary open-angle glaucoma, bilateral, severe stage: Secondary | ICD-10-CM | POA: Diagnosis not present

## 2015-11-21 DIAGNOSIS — H2513 Age-related nuclear cataract, bilateral: Secondary | ICD-10-CM | POA: Diagnosis not present

## 2015-11-30 ENCOUNTER — Ambulatory Visit (INDEPENDENT_AMBULATORY_CARE_PROVIDER_SITE_OTHER): Payer: Federal, State, Local not specified - PPO | Admitting: Physician Assistant

## 2015-11-30 VITALS — BP 128/70 | HR 52 | Temp 97.7°F | Resp 18 | Ht 66.0 in | Wt 188.0 lb

## 2015-11-30 DIAGNOSIS — G5701 Lesion of sciatic nerve, right lower limb: Secondary | ICD-10-CM

## 2015-11-30 MED ORDER — CYCLOBENZAPRINE HCL 5 MG PO TABS: 5.0000 mg | ORAL_TABLET | Freq: Three times a day (TID) | ORAL | 1 refills | Status: DC | PRN

## 2015-11-30 MED ORDER — MELOXICAM 15 MG PO TABS: 15.0000 mg | ORAL_TABLET | Freq: Every day | ORAL | 0 refills | Status: DC

## 2015-11-30 NOTE — Patient Instructions (Addendum)
Please perform the stretches three times per day.  Then ice it for 15 minutes directly after the icing.  If your symptoms do not improve in 7 days.  Do not take naproxen or ibuprofen with this medicine.  You can take tylenol.     IF you received an x-ray today, you will receive an invoice from [facility]. Please contact [facility] at [phone] with questions or concerns regarding your invoice.   IF you received labwork today, you will receive an invoice from [employer]. Please contact [facility] at [phone] with questions or concerns regarding your invoice.   Our billing staff will not be able to assist you with questions regarding bills from these companies.  You will be contacted with the lab results as soon as they are available. The fastest way to get your results is to activate your My Chart account. Instructions are located on the last page of this paperwork. If you have not heard from [place] regarding the results in 2 weeks, please contact this office.     pir Piriformis Syndrome With Rehab Piriformis syndrome is a condition the affects the nervous system in the area of the hip, and is characterized by pain and possibly a loss of feeling in the backside (posterior) thigh that may extend down the entire length of the leg. The symptoms are caused by an increase in pressure on the sciatic nerve by the piriformis muscle, which is on the back of the hip and is responsible for externally rotating the hip. The sciatic nerve and its branches connect to much of the leg. Normally the sciatic nerve runs between the piriformis muscle and other muscles. However, in certain individuals the nerve runs through the muscle, which causes an increase in pressure on the nerve and results in the symptoms of piriformis syndrome. SYMPTOMS   Pain, tingling, numbness, or burning in the back of the thigh that may also extend down the entire leg.  Occasionally,  tenderness in the buttock.  Loss of function of the leg.  Pain that worsens when using the piriformis muscle (running, jumping, or stairs).  Pain that increases with prolonged sitting.  Pain that is lessened by lying flat on the back. CAUSES   Piriformis syndrome is the result of an increase in pressure placed on the sciatic nerve. Oftentimes, piriformis syndrome is an overuse injury.  Stress placed on the nerve from a sudden increase in the intensity, frequency, or duration of training.  Compensation of other extremity injuries. RISK INCREASES WITH:  Sports that involve the piriformis muscle (running, walking, or jumping).  You are born with (congenital) a defect in which the sciatic nerve passes through the muscle. PREVENTION  Warm up and stretch properly before activity.  Allow for adequate recovery between workouts.  Maintain physical fitness:  Strength, flexibility, and endurance.  Cardiovascular fitness. PROGNOSIS  If treated properly, the symptoms of piriformis syndrome usually resolve in 2 to 6 weeks. RELATED COMPLICATIONS   Persistent and possibly permanent pain and numbness in the lower extremity.  Weakness of the extremity that may progress to disability and inability to compete. TREATMENT  The most effective treatment for piriformis syndrome is rest from any activities that aggravate the symptoms. Ice and pain medication may help reduce pain and inflammation. The use of strengthening and stretching exercises may help reduce pain with activity. These exercises may be performed at home or with a therapist. A referral to a therapist may be given for further evaluation and treatment,  such as ultrasound. Corticosteroid injections may be given to reduce inflammation that is causing pressure to be placed on the sciatic nerve. If nonsurgical (conservative) treatment is unsuccessful, then surgery may be recommended.  MEDICATION   If pain medication is necessary, then  nonsteroidal anti-inflammatory medications, such as aspirin and ibuprofen, or other minor pain relievers, such as acetaminophen, are often recommended.  Do not take pain medication for 7 days before surgery.  Prescription pain relievers may be given if deemed necessary by your caregiver. Use only as directed and only as much as you need.  Corticosteroid injections may be given by your caregiver. These injections should be reserved for the most serious cases, because they may only be given a certain number of times. HEAT AND COLD:   Cold treatment (icing) relieves pain and reduces inflammation. Cold treatment should be applied for 10 to 15 minutes every 2 to 3 hours for inflammation and pain and immediately after any activity that aggravates your symptoms. Use ice packs or massage the area with a piece of ice (ice massage).  Heat treatment may be used prior to performing the stretching and strengthening activities prescribed by your caregiver, physical therapist, or athletic trainer. Use a heat Jennea Rager or soak the injury in warm water. SEEK IMMEDIATE MEDICAL CARE IF:  Treatment seems to offer no benefit, or the condition worsens.  Any medications produce adverse side effects. EXERCISES RANGE OF MOTION (ROM) AND STRETCHING EXERCISES - Piriformis Syndrome These exercises may help you when beginning to rehabilitate your injury. Your symptoms may resolve with or without further involvement from your physician, physical therapist, or athletic trainer. While completing these exercises, remember:   Restoring tissue flexibility helps normal motion to return to the joints. This allows healthier, less painful movement and activity.  An effective stretch should be held for at least 30 seconds.  A stretch should never be painful. You should only feel a gentle lengthening or release in the stretched tissue. STRETCH - Hip Rotators  Lie on your back on a firm surface. Grasp your right / left knee with your  right / left hand and your ankle with your opposite hand.  Keeping your hips and shoulders firmly planted, gently pull your right / left knee and rotate your lower leg toward your opposite shoulder until you feel a stretch in your buttocks.  Hold this stretch for __________ seconds. Repeat this stretch __________ times. Complete this stretch __________ times per day. STRETCH - Iliotibial Band  On the floor or bed, lie on your side so your right / left leg is on top. Bend your knee and grab your ankle.  Slowly bring your knee back so that your thigh is in line with your trunk. Keep your heel at your buttocks and gently arch your back so your head, shoulders, and hips line up.  Slowly lower your leg so that your knee approaches the floor/bed until you feel a gentle stretch on the outside of your right / left thigh. If you do not feel a stretch and your knee will not fall farther, place the heel of your opposite foot on top of your knee and pull your thigh down farther.  Hold this stretch for __________ seconds. Repeat __________ times. Complete __________ times per day. STRENGTHENING EXERCISES - Piriformis Syndrome  These are some of the caregiver again or until your symptoms are resolved. Remember:   Strong muscles with good endurance tolerate stress better.  Do the exercises as initially prescribed by your caregiver.  Progress slowly with each exercise, gradually increasing the number of repetitions and weight used under their guidance. STRENGTH - Hip Abductors, Straight Leg Raises Be aware of your form throughout the entire exercise so that you exercise the correct muscles. Sloppy form means that you are not strengthening the correct muscles.  Lie on your side so that your head, shoulders, knee, and hip line up. You may bend your lower knee to help maintain your balance. Your right / left leg should be on top.  Roll your hips slightly forward, so that your hips are stacked directly over  each other and your right / left knee is facing forward.  Lift your top leg up 4-6 inches, leading with your heel. Be sure that your foot does not drift forward or that your knee does not roll toward the ceiling.  Hold this position for __________ seconds. You should feel the muscles in your outer hip lifting (you may not notice this until your leg begins to tire).  Slowly lower your leg to the starting position. Allow the muscles to fully relax before beginning the next repetition. Repeat __________ times. Complete this exercise __________ times per day.  STRENGTH - Hip Abductors, Quadruped  On a firm, lightly padded surface, position yourself on your hands and knees. Your hands should be directly below your shoulders and your knees should be directly below your hips.  Keeping your right / left knee bent, lift your leg out to the side. Keep your legs level and in line with your shoulders.  Position yourself on your hands and knees.  Hold for __________ seconds.  Keeping your trunk steady and your hips level, slowly lower your leg to the starting position. Repeat __________ times. Complete this exercise __________ times per day.  STRENGTH - Hip Abductors, Standing  Tie one end of a rubber exercise band/tubing to a secure surface (table, pole) and tie a loop at the other end.  Place the loop around your right / left ankle. Keeping your ankle with the band directly opposite of the secured end, step away until there is tension in the tube/band.  Hold onto a chair as needed for balance.  Keeping your back upright, your shoulders over your hips, and your toes pointing forward, lift your right / left leg out to your side. Be sure to lift your leg with your hip muscles. Do not "throw" your leg or tip your body to lift your leg.  Slowly and with control, return to the starting position. Repeat exercise __________ times. Complete this exercise __________ times per day.    This information is  not intended to replace advice given to you by your health care provider. Make sure you discuss any questions you have with your health care provider.   Document Released: 02/04/2005 Document Revised: 06/21/2014 Document Reviewed: 05/19/2008 Elsevier Interactive Patient Education [employer].

## 2015-11-30 NOTE — Progress Notes (Signed)
[facility] [address], [place] [address] [phone]  Date:  11/30/2015   Name:  Molly Fuller   DOB:  [birth year]   MRN:  [id number]  PCP:  Joycelyn Man, MD    History of Present Illness:  Molly Fuller is a 65 y.o. female patient who presents to [facility] for cc of back pain. Patient reports rate that started 4 days ago. Pain radiates down the thigh to the lower leg on the lateral side. Incision aching feeling patient attempted to walk 2 days ago. This appeared to worsen her symptoms by the next day. She has applied ice and heat, and also has taken Advil for the pain which has improved it some. She has noticed no swelling or heat to the area.   Patient Active Problem List   Diagnosis Date Noted  . Obesity 01/09/2015  . Routine general medical examination at a health care facility 09/05/2011  . Glaucoma 09/05/2011  . Hyperlipidemia 12/15/2006    Past Medical History:  Diagnosis Date  . Glaucoma    left eye  . Hyperlipidemia   . Hypertension     Past Surgical History:  Procedure Laterality Date  . no prior surgery      Social History  Substance Use Topics  . Smoking status: Never Smoker  . Smokeless tobacco: Never Used  . Alcohol use No    Family History  Problem Relation Age of Onset  . Colon cancer Paternal Grandmother 86  . Hypertension Other   . Diabetes Other     No Known Allergies  Medication list has been reviewed and updated.  Current Outpatient Prescriptions on File Prior to Visit  Medication Sig Dispense Refill  . atorvastatin (LIPITOR) 20 MG tablet Take 1 tablet (20 mg total) by mouth daily. 90 tablet 3  . bimatoprost (LUMIGAN) 0.03 % ophthalmic solution Place 1 drop into both eyes at bedtime.    . brimonidine-timolol (COMBIGAN) 0.2-0.5 % ophthalmic solution Place 1 drop into both eyes every 12 (twelve) hours.    Marland Kitchen EPINEPHrine (EPIPEN 2-PAK) 0.3 mg/0.3 mL IJ SOAJ injection Inject 0.3 mLs (0.3 mg total) into the  muscle once. 2 Device 0   No current facility-administered medications on file prior to visit.     ROS ROS otherwise unremarkable unless listed above.  Physical Examination: BP 128/70 (BP Location: Right Arm, Patient Position: Sitting, Cuff Size: Large)   Pulse (!) 52   Temp 97.7 F (36.5 C) (Oral)   Resp 18   Ht 5\' 6"  (1.676 m)   Wt 188 lb (85.3 kg)   LMP 02/19/2000   SpO2 99%   BMI 30.34 kg/m  Ideal Body Weight: Weight in (lb) to have BMI = 25: 154.6  Physical Exam  Constitutional: She is oriented to person, place, and time. She appears well-developed and well-nourished. No distress.  HENT:  Head: Normocephalic and atraumatic.  Right Ear: External ear normal.  Left Ear: External ear normal.  Eyes: Conjunctivae and EOM are normal. Pupils are equal, round, and reactive to light.  Cardiovascular: Normal rate and regular rhythm.   Pulmonary/Chest: Effort normal. No respiratory distress. She has no wheezes.  Musculoskeletal:       Lumbar back: She exhibits no bony tenderness, no swelling, no edema and no spasm.  Pain incited with hyperextension.  She has pain with left lateral deviation at the right buttock site.  She also has tenderness in the buttock area directly at the piriformis site.  Normal patellar reflexes.  Negative straight leg raise test.   Neurological: She is alert and oriented to person, place, and time.  Skin: She is not diaphoretic.  Psychiatric: She has a normal mood and affect. Her behavior is normal.     Assessment and Plan: Molly Fuller is a 65 y.o. female who is here today for cc of buttock pain. Appears to be the piriformis muscle. We will start an anti-inflammatory. Advised of precautions with this medication as well as precautions using a muscle relaxant. Advised her to do stretches 3 times a day. She will ice directly after. She may apply heat before another time. Return to the clinic in 70s of her symptoms do not improve. Advised her to continue  use the medication for an additional week if the pain is improving. Piriformis syndrome of right side - Plan: meloxicam (MOBIC) 15 MG tablet, cyclobenzaprine (FLEXERIL) 5 MG tablet  Ivar Drape, PA-C [facility] 10/12/201712:22 PM

## 2016-01-01 ENCOUNTER — Other Ambulatory Visit: Payer: Self-pay | Admitting: Family Medicine

## 2016-01-01 DIAGNOSIS — Z1231 Encounter for screening mammogram for malignant neoplasm of breast: Secondary | ICD-10-CM

## 2016-01-08 ENCOUNTER — Other Ambulatory Visit (INDEPENDENT_AMBULATORY_CARE_PROVIDER_SITE_OTHER): Payer: Medicare Other

## 2016-01-08 DIAGNOSIS — Z0000 Encounter for general adult medical examination without abnormal findings: Secondary | ICD-10-CM

## 2016-01-08 LAB — BASIC METABOLIC PANEL
BUN: 13 mg/dL (ref 6–23)
CALCIUM: 9.6 mg/dL (ref 8.4–10.5)
CO2: 27 meq/L (ref 19–32)
CREATININE: 0.67 mg/dL (ref 0.40–1.20)
Chloride: 106 mEq/L (ref 96–112)
GFR: 112.88 mL/min (ref >60.00)
GLUCOSE: 90 mg/dL (ref 70–99)
Potassium: 4 mEq/L (ref 3.5–5.1)
Sodium: 140 mEq/L (ref 135–145)

## 2016-01-08 LAB — CBC WITH DIFFERENTIAL/PLATELET
BASOS ABS: 0 10*3/uL (ref 0.0–0.1)
Basophils Relative: 0.3 % (ref 0.0–3.0)
EOS ABS: 0.1 10*3/uL (ref 0.0–0.7)
Eosinophils Relative: 2.6 % (ref 0.0–5.0)
HCT: 40.7 % (ref 36.0–46.0)
Hemoglobin: 13.8 g/dL (ref 12.0–15.0)
LYMPHS ABS: 2.2 10*3/uL (ref 0.7–4.0)
Lymphocytes Relative: 46 % (ref 12.0–46.0)
MCHC: 33.9 g/dL (ref 30.0–36.0)
MCV: 85.5 fl (ref 78.0–100.0)
MONOS PCT: 6.3 % (ref 3.0–12.0)
Monocytes Absolute: 0.3 10*3/uL (ref 0.1–1.0)
NEUTROS ABS: 2.1 10*3/uL (ref 1.4–7.7)
NEUTROS PCT: 44.8 % (ref 43.0–77.0)
PLATELETS: 215 10*3/uL (ref 150.0–400.0)
RBC: 4.76 Mil/uL (ref 3.87–5.11)
RDW: 12.7 % (ref 11.5–15.5)
WBC: 4.8 10*3/uL (ref 4.0–10.5)

## 2016-01-08 LAB — TSH: TSH: 2.71 u[IU]/mL (ref 0.35–4.50)

## 2016-01-08 LAB — POC URINALSYSI DIPSTICK (AUTOMATED)
BILIRUBIN UA: NEGATIVE
GLUCOSE UA: NEGATIVE
Ketones, UA: NEGATIVE
NITRITE UA: NEGATIVE
Protein, UA: NEGATIVE
Spec Grav, UA: 1.02
Urobilinogen, UA: 0.2
pH, UA: 6.5

## 2016-01-08 LAB — HEPATIC FUNCTION PANEL
ALBUMIN: 3.9 g/dL (ref 3.5–5.2)
ALK PHOS: 61 U/L (ref 39–117)
ALT: 15 U/L (ref 0–35)
AST: 18 U/L (ref 0–37)
Bilirubin, Direct: 0.1 mg/dL (ref 0.0–0.3)
Total Bilirubin: 0.8 mg/dL (ref 0.2–1.2)
Total Protein: 7.2 g/dL (ref 6.0–8.3)

## 2016-01-08 LAB — LIPID PANEL
Cholesterol: 201 mg/dL — ABNORMAL HIGH (ref 0–200)
HDL: 64 mg/dL (ref >39.00)
LDL Cholesterol: 119 mg/dL — ABNORMAL HIGH (ref 0–99)
NONHDL: 136.51
Total CHOL/HDL Ratio: 3
Triglycerides: 90 mg/dL (ref 0.0–149.0)
VLDL: 18 mg/dL (ref 0.0–40.0)

## 2016-01-15 ENCOUNTER — Encounter: Payer: Self-pay | Admitting: Family Medicine

## 2016-01-15 ENCOUNTER — Ambulatory Visit (INDEPENDENT_AMBULATORY_CARE_PROVIDER_SITE_OTHER): Payer: Medicare Other | Admitting: Family Medicine

## 2016-01-15 ENCOUNTER — Other Ambulatory Visit (HOSPITAL_COMMUNITY)
Admission: RE | Admit: 2016-01-15 | Discharge: 2016-01-15 | Disposition: A | Discharge status: Home / Self Care | Payer: Medicare Other | Source: Ambulatory Visit | Attending: Family Medicine | Admitting: Family Medicine

## 2016-01-15 VITALS — BP 130/74 | HR 78 | Temp 97.8°F | Ht 65.35 in | Wt 194.0 lb

## 2016-01-15 DIAGNOSIS — E782 Mixed hyperlipidemia: Secondary | ICD-10-CM | POA: Diagnosis not present

## 2016-01-15 DIAGNOSIS — Z01419 Encounter for gynecological examination (general) (routine) without abnormal findings: Secondary | ICD-10-CM | POA: Insufficient documentation

## 2016-01-15 DIAGNOSIS — E785 Hyperlipidemia, unspecified: Secondary | ICD-10-CM | POA: Diagnosis not present

## 2016-01-15 DIAGNOSIS — Z124 Encounter for screening for malignant neoplasm of cervix: Secondary | ICD-10-CM | POA: Diagnosis not present

## 2016-01-15 DIAGNOSIS — H4053X0 Glaucoma secondary to other eye disorders, bilateral, stage unspecified: Secondary | ICD-10-CM

## 2016-01-15 MED ORDER — ATORVASTATIN CALCIUM 20 MG PO TABS: 20.0000 mg | ORAL_TABLET | Freq: Every day | ORAL | 4 refills | Status: DC

## 2016-01-15 NOTE — Progress Notes (Signed)
Molly Fuller is a 65 year old married female nonsmoker G 2 P2 Ab0 who comes in today for evaluation because of history of hyperlipidemia  She takes Lipitor 20 mg daily along with a baby aspirin. Total cholesterol 201 triglycerides 90 HDL 64 LDL 119.  She uses eyedrops from Dr. Carolynn Sayers her ophthalmologist because a history of glaucoma.  She gets routine eye care, dental care, BSE monthly, annual mammography, colonoscopy 2014 was normal. Maternal grandmother had colon cancer.  Vaccinations tetanus 2010 after thorough explanation she declines a flu shot Pneumovax and shingles. She's had a bone density which was normal  LMP was in her early 18s last Pap smear was in November 2014. She's never any trouble with her Paps. We discussed frequency of Paps. She is asymptomatic.  EKG was done because she has a history of hyperlipidemia. Her cardiogram is unchanged.  Review of systems 14 points reviewed and are negative except for above  Social history she is retired lives here in [place]. 2 grown daughters. 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 Abdominal exam negative pelvic examination external genitalia within normal limits vaginal vault was normal cervix is visualized Pap smears done. Some slight spotting because of vaginal and cervical dryness. Bimanual exam negative rectal normal stool guaiac-negative extremity is normal skin no peripheral pulses normal  Impression  #1 hyperlipidemia.... Continue Lipitor and baby aspirin  #2 history of glaucoma..... Continue eyedrops follow-up ophthalmologist  #3 family history of colon  cancer.... Maternal grandmother........ discuss colon cancer screening.

## 2016-01-15 NOTE — Patient Instructions (Signed)
Continue diet and exercise program.......... carbohydrate free  Continue Lipitor and aspirin  Follow-up in one year sooner if any problems

## 2016-01-16 LAB — CYTOLOGY - PAP: DIAGNOSIS: NEGATIVE

## 2016-01-19 ENCOUNTER — Ambulatory Visit
Admission: RE | Admit: 2016-01-19 | Discharge: 2016-01-19 | Disposition: A | Discharge status: Home / Self Care | Payer: Medicare Other | Source: Ambulatory Visit | Attending: Family Medicine | Admitting: Family Medicine

## 2016-01-19 DIAGNOSIS — Z1231 Encounter for screening mammogram for malignant neoplasm of breast: Secondary | ICD-10-CM | POA: Diagnosis not present

## 2016-02-15 ENCOUNTER — Ambulatory Visit (INDEPENDENT_AMBULATORY_CARE_PROVIDER_SITE_OTHER): Payer: Federal, State, Local not specified - PPO | Admitting: Family Medicine

## 2016-02-15 VITALS — BP 140/82 | HR 82 | Temp 98.4°F | Resp 17 | Ht 65.35 in | Wt 193.0 lb

## 2016-02-15 DIAGNOSIS — J40 Bronchitis, not specified as acute or chronic: Secondary | ICD-10-CM | POA: Diagnosis not present

## 2016-02-15 MED ORDER — AZITHROMYCIN 250 MG PO TABS: ORAL_TABLET | ORAL | 0 refills | Status: DC

## 2016-02-15 MED ORDER — IPRATROPIUM BROMIDE 0.03 % NA SOLN: 2.0000 | Freq: Two times a day (BID) | NASAL | 0 refills | Status: DC

## 2016-02-15 MED ORDER — HYDROCOD POLST-CPM POLST ER 10-8 MG/5ML PO SUER: 5.0000 mL | Freq: Two times a day (BID) | ORAL | 0 refills | Status: DC | PRN

## 2016-02-15 NOTE — Progress Notes (Signed)
Patient ID: Molly Fuller, female    DOB: March 03, 1950, 65 y.o.   MRN: [id number]  PCP: Joycelyn Man, MD  Chief Complaint  Patient presents with  . Nasal Congestion    sx began tuesday  . Cough    Subjective:  HPI Molly Fuller, 65 year old female, presents for evaluation of nasal congestion and cough times 3 days. Tightness in chest and nasal drainage. Chest congestion with cough (occasionally productive). Complains of some right ear pain and weakness. She denies chills, fever, headache, wheezing or shortness of breath. Reports prior similar illness in which she was treated with benzonatate medication hasn't helped with cough.  Social History   Social History  . Marital status: Single    Spouse name: N/A  . Number of children: N/A  . Years of education: N/A   Occupational History  . Not on file.   Social History Main Topics  . Smoking status: Never Smoker  . Smokeless tobacco: Never Used  . Alcohol use No  . Drug use: No  . Sexual activity: Not on file   Other Topics Concern  . Not on file   Social History Narrative  . No narrative on file    Family History  Problem Relation Age of Onset  . Colon cancer Paternal Grandmother 60  . Hypertension Other   . Diabetes Other    Review of Systems See HPI  Patient Active Problem List   Diagnosis Date Noted  . Obesity 01/09/2015  . Routine general medical examination at a health care facility 09/05/2011  . Glaucoma 09/05/2011  . Hyperlipidemia 12/15/2006    No Known Allergies  Prior to Admission medications   Medication Sig Start Date End Date Taking? Authorizing Provider  atorvastatin (LIPITOR) 20 MG tablet Take 1 tablet (20 mg total) by mouth daily. 01/15/16 01/14/17 Yes Dorena Cookey, MD  bimatoprost (LUMIGAN) 0.03 % ophthalmic solution Place 1 drop into both eyes at bedtime.   Yes Historical Provider, MD  brimonidine-timolol (COMBIGAN) 0.2-0.5 % ophthalmic solution Place 1 drop into both eyes every 12  (twelve) hours.   Yes Historical Provider, MD  EPINEPHrine (EPIPEN 2-PAK) 0.3 mg/0.3 mL IJ SOAJ injection Inject 0.3 mLs (0.3 mg total) into the muscle once. 09/07/15  Yes Wendie Agreste, MD    Past Medical, Surgical Family and Social History reviewed and updated.    Objective:   Today's Vitals   02/15/16 1553  BP: 140/82  Pulse: 82  Resp: 17  Temp: 98.4 F (36.9 C)  TempSrc: Oral  SpO2: 97%  Weight: 193 lb (87.5 kg)  Height: 5' 5.35" (1.66 m)    Wt Readings from Last 3 Encounters:  02/15/16 193 lb (87.5 kg)  01/15/16 194 lb (88 kg)  11/30/15 188 lb (85.3 kg)   Physical Exam  Constitutional: She is oriented to person, place, and time. She appears well-developed and well-nourished.  HENT:  Head: Normocephalic and atraumatic.  Eyes: Conjunctivae are normal. Pupils are equal, round, and reactive to light.  Neck: Normal range of motion. Neck supple.  Cardiovascular: Normal rate, regular rhythm, normal heart sounds and intact distal pulses.   Pulmonary/Chest: Effort normal and breath sounds normal.  Musculoskeletal: Normal range of motion.  Neurological: She is alert and oriented to person, place, and time.  Skin: Skin is warm and dry.  Psychiatric: She has a normal mood and affect. Her behavior is normal. Judgment and thought content normal.      Assessment & Plan:  1. Bronchitis -Ipratropium (  Atrovent) 2 sprays, twice daily for nasal congestion.  -Take Tussionex 5 ml every 12 hours as needed for cough. May cause drowsiness.   -Fill prescription for Azithromycin if your symptoms haven't improved by Saturday. Printing Start Azithromycin Take 2 tabs x 1 dose, then 1 tab every day for x 4 days. Instructed to fill on Saturday if no improvement with symptomatic treatment   Return for follow-up if symptoms due not resolve.   Carroll Sage. Kenton Kingfisher, MSN, FNP-C [facility]

## 2016-02-15 NOTE — Patient Instructions (Addendum)
Ipratropium (Atrovent) 2 sprays, twice daily for nasal congestion.  Take Tussionex 5 ml every 12 hours as needed for cough. May cause drowsiness.   Fill prescription for Azithromycin if your symptoms haven't improved by Saturday.  IF you received an x-ray today, you will receive an invoice from [facility]. Please contact [facility] at [phone] with questions or concerns regarding your invoice.   IF you received labwork today, you will receive an invoice from [place]. Please contact [facility] at [phone] with questions or concerns regarding your invoice.   Our billing staff will not be able to assist you with questions regarding bills from these companies.  You will be contacted with the lab results as soon as they are available. The fastest way to get your results is to activate your My Chart account. Instructions are located on the last page of this paperwork. If you have not heard from [place] regarding the results in 2 weeks, please contact this office.     Upper Respiratory Infection, Adult Most upper respiratory infections (URIs) are caused by a virus. A URI affects the nose, throat, and upper air passages. The most common type of URI is often called "the common cold." Follow these instructions at home:  Take medicines only as told by your doctor.  Gargle warm saltwater or take cough drops to comfort your throat as told by your doctor.  Use a warm mist humidifier or inhale steam from a shower to increase air moisture. This may make it easier to breathe.  Drink enough fluid to keep your pee (urine) clear or pale yellow.  Eat soups and other clear broths.  Have a healthy diet.  Rest as needed.  Go back to work when your fever is gone or your doctor says it is okay.  You may need to stay home longer to avoid giving your URI to others.  You can also wear a face mask and wash your hands often to prevent spread of the virus.  Use your inhaler more if you  have asthma.  Do not use any tobacco products, including cigarettes, chewing tobacco, or electronic cigarettes. If you need help quitting, ask your doctor. Contact a doctor if:  You are getting worse, not better.  Your symptoms are not helped by medicine.  You have chills.  You are getting more short of breath.  You have brown or red mucus.  You have yellow or brown discharge from your nose.  You have pain in your face, especially when you bend forward.  You have a fever.  You have puffy (swollen) neck glands.  You have pain while swallowing.  You have white areas in the back of your throat. Get help right away if:  You have very bad or constant:  Headache.  Ear pain.  Pain in your forehead, behind your eyes, and over your cheekbones (sinus pain).  Chest pain.  You have long-lasting (chronic) lung disease and any of the following:  Wheezing.  Long-lasting cough.  Coughing up blood.  A change in your usual mucus.  You have a stiff neck.  You have changes in your:  Vision.  Hearing.  Thinking.  Mood. This information is not intended to replace advice given to you by your health care provider. Make sure you discuss any questions you have with your health care provider. Document Released: 07/24/2007 Document Revised: 10/08/2015 Document Reviewed: 05/12/2013 Elsevier Interactive Patient Education  2017 [employer].

## 2016-02-23 ENCOUNTER — Telehealth: Payer: Self-pay

## 2016-02-23 NOTE — Telephone Encounter (Signed)
Pt was seen on 02/15/16 by [name] for Bronchitis and was prescribed azithromycin (ZITHROMAX) 250 MG tablet [id number] and chlorpheniramine-HYDROcodone [facility]) 10-8 MG/5ML Latanya Presser [id number]. She still has a rattling cough. She would like a refill on these 2 scripts.Pharmacy:  [id number], [place]. If she needs to come in she will. Please advise at [phone]

## 2016-02-26 MED ORDER — HYDROCOD POLST-CPM POLST ER 10-8 MG/5ML PO SUER: 5.0000 mL | Freq: Two times a day (BID) | ORAL | 0 refills | Status: DC | PRN

## 2016-02-26 NOTE — Telephone Encounter (Signed)
Pt states she has improved but not 100 percent. I advised cough can take 2 weeks to resolve. If new or worsening sx's needs to be re evaluated. Would like a refill on cough med

## 2016-02-26 NOTE — Telephone Encounter (Signed)
Medication has been printed and patient contacted to pick-up

## 2016-03-29 ENCOUNTER — Ambulatory Visit (INDEPENDENT_AMBULATORY_CARE_PROVIDER_SITE_OTHER): Payer: Federal, State, Local not specified - PPO | Admitting: Physician Assistant

## 2016-03-29 VITALS — BP 130/80 | HR 67 | Temp 98.6°F | Resp 17 | Ht 63.5 in | Wt 197.0 lb

## 2016-03-29 DIAGNOSIS — H6991 Unspecified Eustachian tube disorder, right ear: Secondary | ICD-10-CM | POA: Diagnosis not present

## 2016-03-29 MED ORDER — FLUTICASONE PROPIONATE 50 MCG/ACT NA SUSP: 2.0000 | Freq: Every day | NASAL | 0 refills | Status: DC

## 2016-03-29 MED ORDER — PSEUDOEPHEDRINE HCL ER 120 MG PO TB12: 120.0000 mg | ORAL_TABLET | Freq: Two times a day (BID) | ORAL | 0 refills | Status: DC

## 2016-03-29 MED ORDER — CETIRIZINE HCL 10 MG PO TABS: 10.0000 mg | ORAL_TABLET | Freq: Every day | ORAL | 0 refills | Status: DC

## 2016-03-29 NOTE — Progress Notes (Addendum)
Molly Fuller  MRN: [id number] DOB: 06-28-1950  Subjective:  Molly Fuller is a 66 y.o. female seen in office today for a chief complaint of dull right ear ache x 2 weeks. Had one episode of dizziness the other day, which resolved. Denies tinnitus and hearing loss. She does have a fan by her bed that blows on her all night. Has tried OTC ear drops for the past 3 days, which have helped slightly. Denies seasonal allergies.   Of note, she had bronchitis a month ago and notes her ear was slightly bothering her during this time but it got worse over the past 2 weeks.   Review of Systems  Constitutional: Negative for chills, diaphoresis and fever.  HENT: Positive for congestion ( right side). Negative for sinus pain, sinus pressure and sneezing.   Eyes: Negative for itching.    Patient Active Problem List   Diagnosis Date Noted  . Obesity 01/09/2015  . Routine general medical examination at a health care facility 09/05/2011  . Glaucoma 09/05/2011  . Hyperlipidemia 12/15/2006    Current Outpatient Prescriptions on File Prior to Visit  Medication Sig Dispense Refill  . atorvastatin (LIPITOR) 20 MG tablet Take 1 tablet (20 mg total) by mouth daily. 90 tablet 4  . bimatoprost (LUMIGAN) 0.03 % ophthalmic solution Place 1 drop into both eyes at bedtime.    . brimonidine-timolol (COMBIGAN) 0.2-0.5 % ophthalmic solution Place 1 drop into both eyes every 12 (twelve) hours.    . chlorpheniramine-HYDROcodone (TUSSIONEX PENNKINETIC ER) 10-8 MG/5ML SUER Take 5 mLs by mouth every 12 (twelve) hours as needed for cough. 100 mL 0  . EPINEPHrine (EPIPEN 2-PAK) 0.3 mg/0.3 mL IJ SOAJ injection Inject 0.3 mLs (0.3 mg total) into the muscle once. 2 Device 0  . ipratropium (ATROVENT) 0.03 % nasal spray Place 2 sprays into both nostrils 2 (two) times daily. 30 mL 0   No current facility-administered medications on file prior to visit.     No Known Allergies   Objective:  BP 130/80 (BP Location:  Right Arm, Patient Position: Sitting, Cuff Size: Normal)   Pulse 67   Temp 98.6 F (37 C) (Oral)   Resp 17   Ht 5' 3.5" (1.613 m)   Wt 197 lb (89.4 kg)   LMP 02/19/2000   SpO2 97%   BMI 34.35 kg/m   Physical Exam  Constitutional: She is oriented to person, place, and time and well-developed, well-nourished, and in no distress.  HENT:  Head: Normocephalic and atraumatic.  Right Ear: Tympanic membrane is injected and retracted.  Left Ear: Tympanic membrane, external ear and ear canal normal.  Nose: Mucosal edema (prominent on right side) present. No rhinorrhea. Right sinus exhibits no maxillary sinus tenderness and no frontal sinus tenderness. Left sinus exhibits no maxillary sinus tenderness and no frontal sinus tenderness.  Eyes: Conjunctivae are normal.  Neck: Normal range of motion.  Pulmonary/Chest: Effort normal.  Neurological: She is alert and oriented to person, place, and time. Gait normal.  Skin: Skin is warm and dry.  Psychiatric: Affect normal.  Vitals reviewed.  Assessment and Plan :   1. Eustachian tube disorder, right -History and physical exam consistent with ETD. Recommended removing fan by night stand and replacing with cool mist humidifier. Pt instructed to return to clinic if symptoms worsen, do not improve in 7-10 days, or as needed - fluticasone (FLONASE) 50 MCG/ACT nasal spray; Place 2 sprays into both nostrils daily.  Dispense: 16 g; Refill: 0 -  pseudoephedrine (SUDAFED 12 HOUR) 120 MG 12 hr tablet; Take 1 tablet (120 mg total) by mouth 2 (two) times daily.  Dispense: 30 tablet; Refill: 0 - cetirizine (ZYRTEC) 10 MG tablet; Take 1 tablet (10 mg total) by mouth daily.  Dispense: 30 tablet; Refill: 0  Tenna Delaine PA-C  [facility] 03/29/2016 3:07 PM

## 2016-03-29 NOTE — Patient Instructions (Addendum)
Take medications as prescribed until symptoms resolve. Avoid using any fans in your room. You may want to consider a cool mist humidifier in your room instead. Return to clinic if symptoms worsen, do not improve in 7-10 days, or as needed. Thank you for letting me participate in your health and well being.    Eustachian Tube Dysfunction Introduction The eustachian tube connects the middle ear to the back of the nose. It regulates air pressure in the middle ear by allowing air to move between the ear and nose. It also helps to drain fluid from the middle ear space. When the eustachian tube does not function properly, air pressure, fluid, or both can build up in the middle ear. Eustachian tube dysfunction can affect one or both ears. What are the causes? This condition happens when the eustachian tube becomes blocked or cannot open normally. This may result from:  Ear infections.  Colds and other upper respiratory infections.  Allergies.  Irritation, such as from cigarette smoke or acid from the stomach coming up into the esophagus (gastroesophageal reflux).  Sudden changes in air pressure, such as from descending in an airplane.  Abnormal growths in the nose or throat, such as nasal polyps, tumors, or enlarged tissue at the back of the throat (adenoids). What increases the risk? This condition may be more likely to develop in people who smoke and people who are overweight. Eustachian tube dysfunction may also be more likely to develop in children, especially children who have:  Certain birth defects of the mouth, such as cleft palate.  Large tonsils and adenoids. What are the signs or symptoms? Symptoms of this condition may include:  A feeling of fullness in the ear.  Ear pain.  Clicking or popping noises in the ear.  Ringing in the ear.  Hearing loss.  Loss of balance. Symptoms may get worse when the air pressure around you changes, such as when you travel to an area of  high elevation or fly on an airplane. How is this diagnosed? This condition may be diagnosed based on:  Your symptoms.  A physical exam of your ear, nose, and throat.  Tests, such as those that measure:  The movement of your eardrum (tympanogram).  Your hearing (audiometry). How is this treated? Treatment depends on the cause and severity of your condition. If your symptoms are mild, you may be able to relieve your symptoms by moving air into ("popping") your ears. If you have symptoms of fluid in your ears, treatment may include:  Decongestants.  Antihistamines.  Nasal sprays or ear drops that contain medicines that reduce swelling (steroids). In some cases, you may need to have a procedure to drain the fluid in your eardrum (myringotomy). In this procedure, a small tube is placed in the eardrum to:  Drain the fluid.  Restore the air in the middle ear space. Follow these instructions at home:  Take over-the-counter and prescription medicines only as told by your health care provider.  Use techniques to help pop your ears as recommended by your health care provider. These may include:  Chewing gum.  Yawning.  Frequent, forceful swallowing.  Closing your mouth, holding your nose closed, and gently blowing as if you are trying to blow air out of your nose.  Do not do any of the following until your health care provider approves:  Travel to high altitudes.  Fly in airplanes.  Work in a Pension scheme manager or room.  Scuba dive.  Keep your ears dry.  Dry your ears completely after showering or bathing.  Do not smoke.  Keep all follow-up visits as told by your health care provider. This is important. Contact a health care provider if:  Your symptoms do not go away after treatment.  Your symptoms come back after treatment.  You are unable to pop your ears.  You have:  A fever.  Pain in your ear.  Pain in your head or neck.  Fluid draining from your  ear.  Your hearing suddenly changes.  You become very dizzy.  You lose your balance. This information is not intended to replace advice given to you by your health care provider. Make sure you discuss any questions you have with your health care provider. Document Released: 03/03/2015 Document Revised: 07/13/2015 Document Reviewed: 02/23/2014  2017 Elsevier    IF you received an x-ray today, you will receive an invoice from [facility]. Please contact [facility] at [phone] with questions or concerns regarding your invoice.   IF you received labwork today, you will receive an invoice from [place]. Please contact [facility] at [phone] with questions or concerns regarding your invoice.   Our billing staff will not be able to assist you with questions regarding bills from these companies.  You will be contacted with the lab results as soon as they are available. The fastest way to get your results is to activate your My Chart account. Instructions are located on the last page of this paperwork. If you have not heard from [place] regarding the results in 2 weeks, please contact this office.

## 2016-05-15 DIAGNOSIS — H401133 Primary open-angle glaucoma, bilateral, severe stage: Secondary | ICD-10-CM | POA: Diagnosis not present

## 2016-11-26 DIAGNOSIS — H401133 Primary open-angle glaucoma, bilateral, severe stage: Secondary | ICD-10-CM | POA: Diagnosis not present

## 2016-11-26 DIAGNOSIS — H25813 Combined forms of age-related cataract, bilateral: Secondary | ICD-10-CM | POA: Diagnosis not present

## 2016-12-23 ENCOUNTER — Other Ambulatory Visit: Payer: Self-pay | Admitting: Family Medicine

## 2016-12-23 DIAGNOSIS — Z139 Encounter for screening, unspecified: Secondary | ICD-10-CM

## 2017-01-15 ENCOUNTER — Ambulatory Visit (INDEPENDENT_AMBULATORY_CARE_PROVIDER_SITE_OTHER): Payer: Medicare Other | Admitting: Family Medicine

## 2017-01-15 ENCOUNTER — Encounter: Payer: Self-pay | Admitting: Family Medicine

## 2017-01-15 VITALS — BP 120/88 | HR 64 | Temp 98.0°F | Ht 63.0 in | Wt 204.0 lb

## 2017-01-15 DIAGNOSIS — Z6834 Body mass index (BMI) 34.0-34.9, adult: Secondary | ICD-10-CM

## 2017-01-15 DIAGNOSIS — E6609 Other obesity due to excess calories: Secondary | ICD-10-CM | POA: Diagnosis not present

## 2017-01-15 DIAGNOSIS — E782 Mixed hyperlipidemia: Secondary | ICD-10-CM

## 2017-01-15 DIAGNOSIS — E7800 Pure hypercholesterolemia, unspecified: Secondary | ICD-10-CM | POA: Diagnosis not present

## 2017-01-15 LAB — POCT URINALYSIS DIPSTICK
Bilirubin, UA: NEGATIVE
GLUCOSE UA: NEGATIVE
Ketones, UA: NEGATIVE
NITRITE UA: NEGATIVE
PROTEIN UA: NEGATIVE
UROBILINOGEN UA: 0.2 U/dL
pH, UA: 5.5 (ref 5.0–8.0)

## 2017-01-15 LAB — HEPATIC FUNCTION PANEL
ALT: 19 U/L (ref 0–35)
AST: 20 U/L (ref 0–37)
Albumin: 4.1 g/dL (ref 3.5–5.2)
Alkaline Phosphatase: 73 U/L (ref 39–117)
BILIRUBIN TOTAL: 0.8 mg/dL (ref 0.2–1.2)
Bilirubin, Direct: 0.1 mg/dL (ref 0.0–0.3)
TOTAL PROTEIN: 7.3 g/dL (ref 6.0–8.3)

## 2017-01-15 LAB — LIPID PANEL
CHOL/HDL RATIO: 4
Cholesterol: 233 mg/dL — ABNORMAL HIGH (ref 0–200)
HDL: 53.9 mg/dL (ref >39.00)
LDL CALC: 160 mg/dL — AB (ref 0–99)
NonHDL: 178.91
TRIGLYCERIDES: 97 mg/dL (ref 0.0–149.0)
VLDL: 19.4 mg/dL (ref 0.0–40.0)

## 2017-01-15 LAB — BASIC METABOLIC PANEL
BUN: 10 mg/dL (ref 6–23)
CO2: 27 meq/L (ref 19–32)
CREATININE: 0.72 mg/dL (ref 0.40–1.20)
Calcium: 9.8 mg/dL (ref 8.4–10.5)
Chloride: 105 mEq/L (ref 96–112)
GFR: 103.57 mL/min (ref >60.00)
Glucose, Bld: 87 mg/dL (ref 70–99)
Potassium: 3.9 mEq/L (ref 3.5–5.1)
SODIUM: 140 meq/L (ref 135–145)

## 2017-01-15 LAB — CBC WITH DIFFERENTIAL/PLATELET
BASOS ABS: 0 10*3/uL (ref 0.0–0.1)
Basophils Relative: 0.7 % (ref 0.0–3.0)
EOS ABS: 0.1 10*3/uL (ref 0.0–0.7)
Eosinophils Relative: 1.6 % (ref 0.0–5.0)
HEMATOCRIT: 43.6 % (ref 36.0–46.0)
HEMOGLOBIN: 14.5 g/dL (ref 12.0–15.0)
LYMPHS PCT: 44.9 % (ref 12.0–46.0)
Lymphs Abs: 1.7 10*3/uL (ref 0.7–4.0)
MCHC: 33.3 g/dL (ref 30.0–36.0)
MCV: 87.3 fl (ref 78.0–100.0)
MONOS PCT: 6.4 % (ref 3.0–12.0)
Monocytes Absolute: 0.2 10*3/uL (ref 0.1–1.0)
Neutro Abs: 1.8 10*3/uL (ref 1.4–7.7)
Neutrophils Relative %: 46.4 % (ref 43.0–77.0)
Platelets: 237 10*3/uL (ref 150.0–400.0)
RBC: 5 Mil/uL (ref 3.87–5.11)
RDW: 13.2 % (ref 11.5–15.5)
WBC: 3.9 10*3/uL — AB (ref 4.0–10.5)

## 2017-01-15 LAB — TSH: TSH: 2.22 u[IU]/mL (ref 0.35–4.50)

## 2017-01-15 MED ORDER — ATORVASTATIN CALCIUM 20 MG PO TABS: 20.0000 mg | ORAL_TABLET | Freq: Every day | ORAL | 4 refills | Status: DC

## 2017-01-15 NOTE — Patient Instructions (Signed)
Begin a diet and exercise program.................. walk 30 minutes daily...Marland KitchenMarland KitchenMarland Kitchen no sugar  Goal to lose 12 pounds in the next 12 months  Continue the Lipitor  Labs today...........Marland Kitchen we will call you if there is anything abnormal..Marland KitchenMarland Kitchen

## 2017-01-15 NOTE — Progress Notes (Signed)
Molly Fuller is a 66 year old married female nonsmoker who comes in today for evaluation of hyperlipidemia  She takes Lipitor 20 mg daily. Labs today. She does not take an aspirin tablet. She says it won't help prevent heart disease or stroke  She is on eyedrops from ophthalmologist Dr. Carolynn Sayers because of a history of glaucoma. She also has bilateral cataracts but her vision okay  She gets routine eye care, dental care, BSE monthly, and you mammography, colonoscopy 2014 normal.  Vaccinations tetanus booster 2010. She declines a flu vaccine and Pneumovax and shingles. She says there are chemicals under the not good for you. We tried to reason with her but she was unresponsive to scientific information. 14 point review of systems is otherwise negative  Cognitive function normal she does not exercise daily encouraged to do so, home health safety reviewed no issues identified, no guns in the house, she does have a healthcare power of attorney and living well.  Last Pap November 2017 by GYN therefore not repeated.  BP 120/88 (BP Location: Left Arm, Patient Position: Sitting, Cuff Size: Normal)   Pulse 64   Temp 98 F (36.7 C) (Oral)   Ht 5\' 3"  (1.6 m)   Wt 204 lb (92.5 kg)   LMP 02/19/2000   BMI 36.14 kg/m  Well-developed well-nourished overweight female no acute distress examination HEENT is pertinent shows bilateral cataracts neck was supple thyroid not large no carotid bruits cardiopulmonary exam normal breast exam normal abdominal exam normal pelvic and rectal by GYN therefore deferred extremities normal skin or peripheral pulses normal  #1 hyperlipidemia........ continue Lipitor check labs  #2 overweight.......Marland Kitchen prescribed diet exercise and weight loss.

## 2017-01-21 ENCOUNTER — Ambulatory Visit
Admission: RE | Admit: 2017-01-21 | Discharge: 2017-01-21 | Disposition: A | Discharge status: Home / Self Care | Payer: Medicare Other | Source: Ambulatory Visit | Attending: Family Medicine | Admitting: Family Medicine

## 2017-01-21 DIAGNOSIS — Z1231 Encounter for screening mammogram for malignant neoplasm of breast: Secondary | ICD-10-CM | POA: Diagnosis not present

## 2017-01-21 DIAGNOSIS — Z139 Encounter for screening, unspecified: Secondary | ICD-10-CM

## 2017-02-26 DIAGNOSIS — H401133 Primary open-angle glaucoma, bilateral, severe stage: Secondary | ICD-10-CM | POA: Diagnosis not present

## 2017-04-27 ENCOUNTER — Encounter (HOSPITAL_COMMUNITY): Payer: Self-pay | Admitting: Emergency Medicine

## 2017-04-27 ENCOUNTER — Ambulatory Visit (HOSPITAL_COMMUNITY)
Admission: EM | Admit: 2017-04-27 | Discharge: 2017-04-27 | Disposition: A | Discharge status: Home / Self Care | Payer: Medicare Other | Attending: Internal Medicine | Admitting: Internal Medicine

## 2017-04-27 DIAGNOSIS — K529 Noninfective gastroenteritis and colitis, unspecified: Secondary | ICD-10-CM | POA: Diagnosis not present

## 2017-04-27 LAB — POCT URINALYSIS DIP (DEVICE)
GLUCOSE, UA: NEGATIVE mg/dL
Ketones, ur: 15 mg/dL — AB
Leukocytes, UA: NEGATIVE
Nitrite: NEGATIVE
PH: 5.5 (ref 5.0–8.0)
PROTEIN: 30 mg/dL — AB
Urobilinogen, UA: 0.2 mg/dL (ref 0.0–1.0)

## 2017-04-27 MED ORDER — ONDANSETRON HCL 4 MG PO TABS: 4.0000 mg | ORAL_TABLET | Freq: Four times a day (QID) | ORAL | 0 refills | Status: DC

## 2017-04-27 MED ORDER — ONDANSETRON 4 MG PO TBDP
4.0000 mg | ORAL_TABLET | Freq: Once | ORAL | Status: AC
  Administered 2017-04-27: 4 mg via ORAL

## 2017-04-27 MED ORDER — ONDANSETRON 4 MG PO TBDP
ORAL_TABLET | ORAL | Status: AC
  Filled 2017-04-27: qty 1

## 2017-04-27 NOTE — ED Provider Notes (Signed)
[facility]    CSN: [id number] Arrival date & time: 04/27/17  1930     History   Chief Complaint Chief Complaint  Patient presents with  . Emesis    HPI Molly Fuller is a 67 y.o. female.   Molly Fuller presents with her husband with complaints of nausea vomiting and diarrhea which started at 0100 this morning. She states her stomach started to feel unwell after eating at a seafood restaurant last night. She has vomited 4 times today, last at 1500 today. Diarrhea started later, and she has had approximately 3 episodes. Without blood. Denies any further abdominal pain but still with nausea. Has been drinking some and taking ice chips. Urinating. Feels weak. No other known ill contacts. Without fevers. Without contributing medical history, denies previous abdominal surgeries.    ROS per HPI.       Past Medical History:  Diagnosis Date  . Glaucoma    left eye  . Hyperlipidemia   . Hypertension     Patient Active Problem List   Diagnosis Date Noted  . Obesity 01/09/2015  . Routine general medical examination at a health care facility 09/05/2011  . Glaucoma 09/05/2011  . Hyperlipidemia 12/15/2006    Past Surgical History:  Procedure Laterality Date  . no prior surgery      OB History    Gravida Para Term Preterm AB Living   2 2           SAB TAB Ectopic Multiple Live Births                   Home Medications    Prior to Admission medications   Medication Sig Start Date End Date Taking? Authorizing Provider  atorvastatin (LIPITOR) 20 MG tablet Take 1 tablet (20 mg total) by mouth daily. 01/15/17 01/15/18  Dorena Cookey, MD  bimatoprost (LUMIGAN) 0.03 % ophthalmic solution Place 1 drop into both eyes at bedtime.    [provider]  brimonidine-timolol (COMBIGAN) 0.2-0.5 % ophthalmic solution Place 1 drop into both eyes every 12 (twelve) hours.    [provider]  EPINEPHrine (EPIPEN 2-PAK) 0.3 mg/0.3 mL IJ SOAJ injection Inject  0.3 mLs (0.3 mg total) into the muscle once. 09/07/15   Wendie Agreste, MD  ondansetron (ZOFRAN) 4 MG tablet Take 1 tablet (4 mg total) by mouth every 6 (six) hours. 04/27/17   Zigmund Gottron, NP    Family History Family History  Problem Relation Age of Onset  . Colon cancer Paternal Grandmother 45  . Hypertension Other   . Diabetes Other   . Breast cancer Neg Hx     Social History Social History   Tobacco Use  . Smoking status: Never Smoker  . Smokeless tobacco: Never Used  Substance Use Topics  . Alcohol use: No  . Drug use: No     Allergies   Patient has no known allergies.   Review of Systems Review of Systems   Physical Exam Triage Vital Signs ED Triage Vitals [04/27/17 2021]  Enc Vitals Group     BP 120/69     Pulse Rate 83     Resp 18     Temp 99.1 F (37.3 C)     Temp Source Oral     SpO2 95 %     Weight      Height      Head Circumference      Peak Flow      Pain Score  Pain Loc      Pain Edu?      Excl. in [place]?    No data found.  Updated Vital Signs BP 120/69 (BP Location: Left Arm)   Pulse 83   Temp 99.1 F (37.3 C) (Oral)   Resp 18   LMP 02/19/2000   SpO2 95%   Visual Acuity Right Eye Distance:   Left Eye Distance:   Bilateral Distance:    Right Eye Near:   Left Eye Near:    Bilateral Near:     Physical Exam  Constitutional: She is oriented to person, place, and time. She appears well-developed and well-nourished. No distress.  Cardiovascular: Normal rate, regular rhythm and normal heart sounds.  Pulmonary/Chest: Effort normal and breath sounds normal.  Abdominal: Soft. Bowel sounds are normal. There is generalized tenderness. There is no rigidity, no rebound, no guarding, no CVA tenderness, no tenderness at McBurney's point and negative Murphy's sign.  Abdominal pain primarily at periumbilical   Neurological: She is alert and oriented to person, place, and time.  Skin: Skin is warm and dry.     UC Treatments /  Results  Labs (all labs ordered are listed, but only abnormal results are displayed) Labs Reviewed  POCT URINALYSIS DIP (DEVICE) - Abnormal; Notable for the following components:      Result Value   Bilirubin Urine MODERATE (*)    Ketones, ur 15 (*)    Hgb urine dipstick SMALL (*)    Protein, ur 30 (*)    All other components within normal limits    EKG  EKG Interpretation None       Radiology No results found.  Procedures Procedures (including critical care time)  Medications Ordered in UC Medications  ondansetron (ZOFRAN-ODT) disintegrating tablet 4 mg (4 mg Oral Given 04/27/17 2116)     Initial Impression / Assessment and Plan / UC Course  I have reviewed the triage vital signs and the nursing notes.  Pertinent labs & imaging results that were available during my care of the patient were reviewed by me and considered in my medical decision making (see chart for details).     Mild dehydration per urine sample, patient has been tolerating sips of fluids. Non specific abdominal exam. Consistent with viral illness. zofran provided and script provided. Liquid diet until symptoms improve. Advance to bland diet. Discussed expected course of illness. Return precautions provided. Patient verbalized understanding and agreeable to plan.  Ambulatory out of clinic without difficulty.    Final Clinical Impressions(s) / UC Diagnoses   Final diagnoses:  Gastroenteritis    ED Discharge Orders        Ordered    ondansetron (ZOFRAN) 4 MG tablet  Every 6 hours     04/27/17 2131       Controlled Substance Prescriptions [place] Controlled Substance Registry consulted? Not Applicable   Zigmund Gottron, NP 04/27/17 2211

## 2017-04-27 NOTE — Discharge Instructions (Signed)
Small frequent sips of liquids.  Liquid diet until symptoms improve, likely not until tomorrow afternoon may advance to bland diet as tolerated. Zofran every 8 hours as needed for nausea to increase fluid intake. If develop worsening of pain, fevers, dizziness, loss of consciousness or symptoms lasting longer than 7-10 days please return to be seen or follow with your primary care provider.

## 2017-04-27 NOTE — ED Triage Notes (Signed)
Pt here with chills and vomiting starting last night

## 2017-04-30 ENCOUNTER — Telehealth: Payer: Self-pay | Admitting: Family Medicine

## 2017-04-30 NOTE — Telephone Encounter (Signed)
Copied from [place]. Topic: Quick Communication - See Telephone Encounter >> Apr 30, 2017 10:24 AM Synthia Innocent wrote: CRM for notification. See Telephone encounter for: Patient was in Urgent Care on 04/27/17. Would like to speak with nurse regarding potassium.  04/30/17.

## 2017-05-01 NOTE — Telephone Encounter (Signed)
Spoke with pt stated that she had diarrhea/vomiting for a few days and was concerned if that would make her potassium level low, advised pt that she can schedule an appointment for a lab draw to check if her levels are ok.Notified pt that I would clarify with Dr Sherren Mocha. Pt has no complaints or any symptoms just want to be sure her levels are ok.

## 2017-05-05 NOTE — Telephone Encounter (Signed)
This is Dr Honor Junes pt please Advise. Thank you

## 2017-05-06 NOTE — Telephone Encounter (Signed)
She can drink some gatorade or other rehydration solution.  No need to check labs unless she is still sick.

## 2017-05-06 NOTE — Telephone Encounter (Signed)
Called pt voiced understand that she can drink gatorade for hydration, notified pt that there is no need of rechecking her potassium is she is not sick.Marland Kitchen

## 2017-10-02 ENCOUNTER — Ambulatory Visit (HOSPITAL_COMMUNITY)
Admission: EM | Admit: 2017-10-02 | Discharge: 2017-10-02 | Disposition: A | Discharge status: Home / Self Care | Payer: Medicare Other | Attending: Family Medicine | Admitting: Family Medicine

## 2017-10-02 ENCOUNTER — Other Ambulatory Visit: Payer: Self-pay

## 2017-10-02 ENCOUNTER — Encounter (HOSPITAL_COMMUNITY): Payer: Self-pay | Admitting: Emergency Medicine

## 2017-10-02 DIAGNOSIS — L500 Allergic urticaria: Secondary | ICD-10-CM | POA: Diagnosis not present

## 2017-10-02 DIAGNOSIS — S70362A Insect bite (nonvenomous), left thigh, initial encounter: Secondary | ICD-10-CM | POA: Diagnosis not present

## 2017-10-02 DIAGNOSIS — S70361A Insect bite (nonvenomous), right thigh, initial encounter: Secondary | ICD-10-CM | POA: Diagnosis not present

## 2017-10-02 DIAGNOSIS — Z91030 Bee allergy status: Secondary | ICD-10-CM

## 2017-10-02 DIAGNOSIS — T7840XA Allergy, unspecified, initial encounter: Secondary | ICD-10-CM

## 2017-10-02 DIAGNOSIS — T63441A Toxic effect of venom of bees, accidental (unintentional), initial encounter: Secondary | ICD-10-CM

## 2017-10-02 MED ORDER — DIPHENHYDRAMINE HCL 25 MG PO CAPS
25.0000 mg | ORAL_CAPSULE | Freq: Once | ORAL | Status: AC
  Administered 2017-10-02: 25 mg via ORAL

## 2017-10-02 MED ORDER — DIPHENHYDRAMINE HCL 25 MG PO CAPS
ORAL_CAPSULE | ORAL | Status: AC
  Filled 2017-10-02: qty 1

## 2017-10-02 MED ORDER — METHYLPREDNISOLONE SODIUM SUCC 125 MG IJ SOLR
125.0000 mg | Freq: Once | INTRAMUSCULAR | Status: AC
  Administered 2017-10-02: 125 mg via INTRAMUSCULAR

## 2017-10-02 MED ORDER — METHYLPREDNISOLONE 4 MG PO TBPK: ORAL_TABLET | ORAL | 0 refills | Status: DC

## 2017-10-02 MED ORDER — METHYLPREDNISOLONE SODIUM SUCC 125 MG IJ SOLR
INTRAMUSCULAR | Status: AC
  Filled 2017-10-02: qty 2

## 2017-10-02 NOTE — ED Triage Notes (Addendum)
Patient was stung by a wasp approx 1 hour ago.  Patient has red splotchy areas to chest and both legs, arms are flaring up.  Patient has scratchy throat.  Lips swelling.  No difficulty breathing

## 2017-10-02 NOTE — Discharge Instructions (Signed)
Continue Benadryl every 4 hours for the itching Take the prednisone pack as directed Return if worse in any time instead of better, or any lip or mouth swelling, for any shortness of breath

## 2017-10-02 NOTE — ED Provider Notes (Signed)
[name]    CSN: [id number] Arrival date & time: 10/02/17  1057     History   Chief Complaint Chief Complaint  Patient presents with  . Insect Bite    HPI Molly Fuller is a 67 y.o. female.   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  She was out doing yard work today and got into a nest.  Erskine Emery several times.  Now with diffuse hives.  No trouble breathing or lip swelling.  NO wheeze.  She has a,n epi pen but did not use it.  Usually takes benadryl but could not find it. Otherwise is healthy.  Takes a statin for cholesterol and eye drops for glaucoma Past Medical History:  Diagnosis Date  . Glaucoma    left eye  . Hyperlipidemia   . Hypertension     Patient Active Problem List   Diagnosis Date Noted  . Obesity 01/09/2015  . Routine general medical examination at a health care facility 09/05/2011  . Glaucoma 09/05/2011  . Hyperlipidemia 12/15/2006    Past Surgical History:  Procedure Laterality Date  . no prior surgery      OB History    Gravida  2   Para  2   Term      Preterm      AB      Living        SAB      TAB      Ectopic      Multiple      Live Births               Home Medications    Prior to Admission medications   Medication Sig Start Date End Date Taking? Authorizing Provider  atorvastatin (LIPITOR) 20 MG tablet Take 1 tablet (20 mg total) by mouth daily. 01/15/17 01/15/18  Dorena Cookey, MD  bimatoprost (LUMIGAN) 0.03 % ophthalmic solution Place 1 drop into both eyes at bedtime.    [provider]  brimonidine-timolol (COMBIGAN) 0.2-0.5 % ophthalmic solution Place 1 drop into both eyes every 12 (twelve) hours.    [provider]  EPINEPHrine (EPIPEN 2-PAK) 0.3 mg/0.3 mL IJ SOAJ injection Inject 0.3 mLs (0.3 mg total) into the muscle once. 09/07/15   Wendie Agreste, MD  methylPREDNISolone (MEDROL DOSEPAK) 4 MG TBPK tablet tad 10/02/17   Raylene Everts, MD    Family  History Family History  Problem Relation Age of Onset  . Colon cancer Paternal Grandmother 36  . Hypertension Other   . Diabetes Other   . Breast cancer Neg Hx     Social History Social History   Tobacco Use  . Smoking status: Never Smoker  . Smokeless tobacco: Never Used  Substance Use Topics  . Alcohol use: No  . Drug use: No     Allergies   Patient has no known allergies.   Review of Systems Review of Systems  Constitutional: Negative for chills and fever.  HENT: Negative for congestion, ear pain, sore throat and trouble swallowing.   Eyes: Negative for pain and visual disturbance.  Respiratory: Negative for cough and shortness of breath.   Cardiovascular: Negative for chest pain and palpitations.  Gastrointestinal: Negative for abdominal pain and vomiting.  Genitourinary: Negative for dysuria and hematuria.  Musculoskeletal: Negative for arthralgias and back pain.  Skin: Positive for rash. Negative for color change.       Multiple  Neurological: Negative for seizures and syncope.  All other systems reviewed and are negative.    Physical Exam Triage Vital Signs ED Triage Vitals  Enc Vitals Group     BP 10/02/17 1120 116/82     Pulse Rate 10/02/17 1120 (!) 56     Resp 10/02/17 1120 16     Temp 10/02/17 1120 97.9 F (36.6 C)     Temp Source 10/02/17 1120 Oral     SpO2 10/02/17 1120 98 %   No data found.  Updated Vital Signs BP 116/82 (BP Location: Left Arm)   Pulse (!) 56   Temp 97.9 F (36.6 C) (Oral)   Resp 16   LMP 02/19/2000   SpO2 98%      Physical Exam  Constitutional: She appears well-developed and well-nourished. No distress.  HENT:  Head: Normocephalic and atraumatic.  Right Ear: External ear normal.  Left Ear: External ear normal.  Mouth/Throat: Oropharynx is clear and moist.  Uvula normal  Eyes: Pupils are equal, round, and reactive to light. Conjunctivae are normal.  Neck: Normal range of motion.  Cardiovascular: Normal rate,  regular rhythm and normal heart sounds.  Pulmonary/Chest: Effort normal and breath sounds normal. No respiratory distress.  Abdominal: Soft. She exhibits no distension.  Musculoskeletal: Normal range of motion. She exhibits no edema.  Neurological: She is alert.  Skin: Skin is warm and dry.  Around sting on the left anterior thigh there is 15 cm of erythema.  Similar sting on right anterior thigh has about 8 cm of erythema.  Arms, chest, face, neck have multiple urticarial wheals 1 to 2 cm across  Psychiatric: She has a normal mood and affect. Her behavior is normal.     UC Treatments / Results  Labs (all labs ordered are listed, but only abnormal results are displayed) Labs Reviewed - No data to display  EKG None  Radiology No results found.  Procedures Procedures (including critical care time)  Medications Ordered in UC Medications  methylPREDNISolone sodium succinate (SOLU-MEDROL) 125 mg/2 mL injection 125 mg (125 mg Intramuscular Given 10/02/17 1137)  diphenhydrAMINE (BENADRYL) capsule 25 mg (25 mg Oral Given 10/02/17 1137)    Initial Impression / Assessment and Plan / UC Course  I have reviewed the triage vital signs and the nursing notes.  Pertinent labs & imaging results that were available during my care of the patient were reviewed by me and considered in my medical decision making (see chart for details).     Patient rechecked at 15 minutes after her injection and p.o. Benadryl.  She was not feeling significantly better.  At 30 minutes after her injection and medicine the hives were regressing, itching was improved.  No shortness of breath or mouth swelling. Discussed allergic reaction.  Discussed prompt use of Benadryl.  Discussed use of EpiPen for any respiratory difficulty.  Discussed reasons for return, shortness of breath, increasing erythema or swelling. Final Clinical Impressions(s) / UC Diagnoses   Final diagnoses:  Bee sting, accidental or unintentional,  initial encounter  Allergic reaction, initial encounter     Discharge Instructions     Continue Benadryl every 4 hours for the itching Take the prednisone pack as directed Return if worse in any time instead of better, or any lip or mouth swelling, for any shortness of breath    ED Prescriptions    Medication Sig Dispense Auth. Provider   methylPREDNISolone (MEDROL DOSEPAK) 4 MG TBPK tablet tad 21 tablet Raylene Everts, MD     Controlled Substance Prescriptions [place] Controlled  Substance Registry consulted? Not Applicable   Raylene Everts, MD 10/02/17 [phone]

## 2017-11-11 DIAGNOSIS — H25813 Combined forms of age-related cataract, bilateral: Secondary | ICD-10-CM | POA: Diagnosis not present

## 2017-11-11 DIAGNOSIS — H401133 Primary open-angle glaucoma, bilateral, severe stage: Secondary | ICD-10-CM | POA: Diagnosis not present

## 2017-12-30 ENCOUNTER — Encounter: Payer: Self-pay | Admitting: Family Medicine

## 2017-12-30 ENCOUNTER — Ambulatory Visit (INDEPENDENT_AMBULATORY_CARE_PROVIDER_SITE_OTHER): Payer: Medicare Other | Admitting: Family Medicine

## 2017-12-30 DIAGNOSIS — K5900 Constipation, unspecified: Secondary | ICD-10-CM | POA: Diagnosis not present

## 2017-12-30 DIAGNOSIS — T7840XA Allergy, unspecified, initial encounter: Secondary | ICD-10-CM | POA: Diagnosis not present

## 2017-12-30 DIAGNOSIS — T63461A Toxic effect of venom of wasps, accidental (unintentional), initial encounter: Secondary | ICD-10-CM | POA: Diagnosis not present

## 2017-12-30 DIAGNOSIS — Z136 Encounter for screening for cardiovascular disorders: Secondary | ICD-10-CM | POA: Diagnosis not present

## 2017-12-30 DIAGNOSIS — E782 Mixed hyperlipidemia: Secondary | ICD-10-CM | POA: Diagnosis not present

## 2017-12-30 MED ORDER — EPINEPHRINE 0.3 MG/0.3ML IJ SOAJ: 0.3000 mg | Freq: Once | INTRAMUSCULAR | 0 refills | Status: AC

## 2017-12-30 MED ORDER — ATORVASTATIN CALCIUM 20 MG PO TABS: 20.0000 mg | ORAL_TABLET | Freq: Every day | ORAL | 4 refills | Status: DC

## 2017-12-30 NOTE — Patient Instructions (Signed)
OTC MiraLAX.................. use as directed on the bottle............ then do the following...Marland KitchenMarland KitchenMarland Kitchen drink lots of water.......... lots of fruit and fiber...Marland KitchenMarland KitchenMarland Kitchen stool softener daily....... if the constipation persists then call gastroenterology for further evaluation  Set up a time in December January for a physical exam with your new physician........... lab work the first week in December

## 2017-12-30 NOTE — Progress Notes (Signed)
Molly Fuller is a 67 year old female comes in today for evaluation of constipation.  She is due for physical examination after January 15, 2018.  She has a history of bee sting allergy.  This past year she got stung by a wasp however EpiPen was out of date so she did not use it.  She went to emergency room got a dose pack had no pulmonary problems  She is on Lipitor 20 mg daily for hyperlipidemia  For the past 4 months she has had constipation.  She changed her diet and cut out all fats in an attempt to lose weight.  She is tried stool softeners but that has not helped.  She has had no rectal bleeding.  She had a colonoscopy in 2014 which was normal.  LMP 02/19/2000    LMP 02/19/2000  LMP 02/19/2000    Vital signs stable she is afebrile examination the abdomen is supine position.  Abdomen was somewhat enlarged but she said that is normal for her.  She is experienced no bloating etc. etc.  Bowel sounds are normal liver spleen kidneys not enlarged I can appreciate no masses.  In the left lateral position rectal exam was done.  She has a little external hemorrhoidal tag which is benign.  Internal exam was negative stool guaiac negative no palpable masses  1.  Constipation............Marland Kitchen line bowel prep.............. GI consult if symptoms persist  2.  Hyperlipidemia............ check labs........... CPS with new physician

## 2018-01-02 ENCOUNTER — Other Ambulatory Visit: Payer: Self-pay

## 2018-01-29 ENCOUNTER — Other Ambulatory Visit: Payer: Medicare Other

## 2018-02-16 ENCOUNTER — Other Ambulatory Visit: Payer: Self-pay | Admitting: Family Medicine

## 2018-02-16 DIAGNOSIS — Z1231 Encounter for screening mammogram for malignant neoplasm of breast: Secondary | ICD-10-CM

## 2018-02-24 ENCOUNTER — Encounter: Payer: Medicare Other | Admitting: Family Medicine

## 2018-03-05 ENCOUNTER — Other Ambulatory Visit: Payer: Medicare Other

## 2018-03-13 ENCOUNTER — Encounter: Payer: Medicare Other | Admitting: Family Medicine

## 2018-03-18 DIAGNOSIS — H2513 Age-related nuclear cataract, bilateral: Secondary | ICD-10-CM | POA: Diagnosis not present

## 2018-03-18 DIAGNOSIS — H401133 Primary open-angle glaucoma, bilateral, severe stage: Secondary | ICD-10-CM | POA: Diagnosis not present

## 2018-03-20 ENCOUNTER — Ambulatory Visit
Admission: RE | Admit: 2018-03-20 | Discharge: 2018-03-20 | Disposition: A | Discharge status: Home / Self Care | Payer: Medicare Other | Source: Ambulatory Visit | Attending: Family Medicine | Admitting: Family Medicine

## 2018-03-20 DIAGNOSIS — Z1231 Encounter for screening mammogram for malignant neoplasm of breast: Secondary | ICD-10-CM

## 2018-04-06 ENCOUNTER — Other Ambulatory Visit (INDEPENDENT_AMBULATORY_CARE_PROVIDER_SITE_OTHER): Payer: Medicare Other

## 2018-04-06 DIAGNOSIS — E782 Mixed hyperlipidemia: Secondary | ICD-10-CM | POA: Diagnosis not present

## 2018-04-06 LAB — HEPATIC FUNCTION PANEL
ALBUMIN: 3.9 g/dL (ref 3.5–5.2)
ALT: 10 U/L (ref 0–35)
AST: 14 U/L (ref 0–37)
Alkaline Phosphatase: 56 U/L (ref 39–117)
Bilirubin, Direct: 0.1 mg/dL (ref 0.0–0.3)
TOTAL PROTEIN: 6.6 g/dL (ref 6.0–8.3)
Total Bilirubin: 1 mg/dL (ref 0.2–1.2)

## 2018-04-06 LAB — CBC WITH DIFFERENTIAL/PLATELET
BASOS ABS: 0 10*3/uL (ref 0.0–0.1)
Basophils Relative: 0.5 % (ref 0.0–3.0)
EOS PCT: 1.7 % (ref 0.0–5.0)
Eosinophils Absolute: 0.1 10*3/uL (ref 0.0–0.7)
HEMATOCRIT: 43.1 % (ref 36.0–46.0)
HEMOGLOBIN: 14.5 g/dL (ref 12.0–15.0)
LYMPHS PCT: 52 % — AB (ref 12.0–46.0)
Lymphs Abs: 2.1 10*3/uL (ref 0.7–4.0)
MCHC: 33.7 g/dL (ref 30.0–36.0)
MCV: 85.7 fl (ref 78.0–100.0)
MONOS PCT: 8 % (ref 3.0–12.0)
Monocytes Absolute: 0.3 10*3/uL (ref 0.1–1.0)
NEUTROS PCT: 37.8 % — AB (ref 43.0–77.0)
Neutro Abs: 1.5 10*3/uL (ref 1.4–7.7)
Platelets: 228 10*3/uL (ref 150.0–400.0)
RBC: 5.03 Mil/uL (ref 3.87–5.11)
RDW: 13.7 % (ref 11.5–15.5)
WBC: 4 10*3/uL (ref 4.0–10.5)

## 2018-04-06 LAB — BASIC METABOLIC PANEL
BUN: 10 mg/dL (ref 6–23)
CHLORIDE: 105 meq/L (ref 96–112)
CO2: 27 meq/L (ref 19–32)
CREATININE: 0.78 mg/dL (ref 0.40–1.20)
Calcium: 9.6 mg/dL (ref 8.4–10.5)
GFR: 88.52 mL/min (ref >60.00)
GLUCOSE: 79 mg/dL (ref 70–99)
POTASSIUM: 4.5 meq/L (ref 3.5–5.1)
Sodium: 140 mEq/L (ref 135–145)

## 2018-04-06 LAB — LIPID PANEL
Cholesterol: 291 mg/dL — ABNORMAL HIGH (ref 0–200)
HDL: 47.4 mg/dL (ref >39.00)
LDL CALC: 219 mg/dL — AB (ref 0–99)
NONHDL: 243.5
TRIGLYCERIDES: 123 mg/dL (ref 0.0–149.0)
Total CHOL/HDL Ratio: 6
VLDL: 24.6 mg/dL (ref 0.0–40.0)

## 2018-04-07 LAB — TSH: TSH: 4.83 u[IU]/mL — ABNORMAL HIGH (ref 0.35–4.50)

## 2018-04-14 ENCOUNTER — Ambulatory Visit (INDEPENDENT_AMBULATORY_CARE_PROVIDER_SITE_OTHER): Payer: Medicare Other | Admitting: Family Medicine

## 2018-04-14 ENCOUNTER — Encounter: Payer: Self-pay | Admitting: Family Medicine

## 2018-04-14 VITALS — BP 122/78 | HR 60 | Temp 97.9°F | Resp 12 | Ht 63.0 in | Wt 174.4 lb

## 2018-04-14 DIAGNOSIS — R946 Abnormal results of thyroid function studies: Secondary | ICD-10-CM

## 2018-04-14 DIAGNOSIS — Z6830 Body mass index (BMI) 30.0-30.9, adult: Secondary | ICD-10-CM | POA: Diagnosis not present

## 2018-04-14 DIAGNOSIS — R7989 Other specified abnormal findings of blood chemistry: Secondary | ICD-10-CM

## 2018-04-14 DIAGNOSIS — E6609 Other obesity due to excess calories: Secondary | ICD-10-CM | POA: Diagnosis not present

## 2018-04-14 DIAGNOSIS — E785 Hyperlipidemia, unspecified: Secondary | ICD-10-CM | POA: Diagnosis not present

## 2018-04-14 DIAGNOSIS — K5900 Constipation, unspecified: Secondary | ICD-10-CM

## 2018-04-14 NOTE — Patient Instructions (Addendum)
A few things to remember from today's visit:   Hyperlipidemia, unspecified hyperlipidemia type  Continue Lipitor and low fat diet.    Please be sure medication list is accurate. If a new problem present, please set up appointment sooner than planned today.

## 2018-04-14 NOTE — Assessment & Plan Note (Signed)
We discussed some side effects of Phentermine. I do not recommend strict diets as the one she has followed for 4 months. Recommend considering 1200 cal/day instead 800 and/or weight watchers. She is currently following with weight clinic provider.

## 2018-04-14 NOTE — Assessment & Plan Note (Signed)
Improved with OTC "Prune Lax." Adequate fluid and fiber intake.

## 2018-04-14 NOTE — Progress Notes (Signed)
HPI:   Molly Fuller is a 68 y.o. female, who is here today to establish care and to follow on recent lab results. Former PCP: Dr. Sherren Mocha. Hx of HLD and constipation.  Hyperlipidemia: She stopped Lipitor 20 mg in 11/2017, she resume diet about a week ago after she saw her labs from my chart. No side effects from medication. She also follows a low-fat diet. Negative for history of CVD.  Lab Results  Component Value Date   CHOL 291 (H) 04/06/2018   HDL 47.40 04/06/2018   LDLCALC 219 (H) 04/06/2018   LDLDIRECT 262.7 12/29/2012   TRIG 123.0 04/06/2018   CHOLHDL 6 04/06/2018    Lab Results  Component Value Date   ALT 10 04/06/2018   AST 14 04/06/2018   ALKPHOS 56 04/06/2018   BILITOT 1.0 04/06/2018   Lab Results  Component Value Date   CREATININE 0.78 04/06/2018   BUN 10 04/06/2018   NA 140 04/06/2018   K 4.5 04/06/2018   CL 105 04/06/2018   CO2 27 04/06/2018    Obesity: Since 11/2017 she is following 800-calorie diet. She is following with provider, currently also taking phentermine. She has lost about 30 pounds since she started dietary changes. She reports feeling fatigued, stressed, irritable since she started this diet. She does not have enough energy to exercise regularly.  Since she been following this diet she also noted worsening constipation. MiraLAX and bisacodyl did not help. She is now taking "prune lax", she has a bowel movement every time she takes medication, daily as needed. Negative for abdominal pain, nausea, vomiting, or blood in the stool. Colonoscopy last done in 05/2012.  No prior history of thyroid disease, recent TSH was mildly abnormal.  Lab Results  Component Value Date   TSH 4.83 (H) 04/06/2018   Also concerned about some mild abnormalities seen in recent CBC. Lab Results  Component Value Date   WBC 4.0 04/06/2018   HGB 14.5 04/06/2018   HCT 43.1 04/06/2018   MCV 85.7 04/06/2018   PLT 228.0 04/06/2018   She has  not noted chills, fever, night sweats.   Review of Systems  Constitutional: Positive for fatigue. Negative for chills and fever.  HENT: Negative for mouth sores, nosebleeds and trouble swallowing.   Eyes: Negative for redness and visual disturbance.  Respiratory: Negative for cough, shortness of breath and wheezing.   Cardiovascular: Negative for chest pain, palpitations and leg swelling.  Gastrointestinal: Positive for constipation. Negative for abdominal pain, nausea and vomiting.  Endocrine: Negative for polydipsia, polyphagia and polyuria.  Genitourinary: Negative for decreased urine volume and hematuria.  Musculoskeletal: Negative for gait problem and myalgias.  Neurological: Negative for syncope, weakness and headaches.  Psychiatric/Behavioral: Negative for confusion. The patient is nervous/anxious.     Current Outpatient Medications on File Prior to Visit  Medication Sig Dispense Refill  . atorvastatin (LIPITOR) 20 MG tablet Take 1 tablet (20 mg total) by mouth daily. 90 tablet 4  . bimatoprost (LUMIGAN) 0.03 % ophthalmic solution Place 1 drop into both eyes at bedtime.    . brimonidine-timolol (COMBIGAN) 0.2-0.5 % ophthalmic solution Place 1 drop into both eyes every 12 (twelve) hours.    . dorzolamide (TRUSOPT) 2 % ophthalmic solution INSTILL 1 DROP INTO BOTH EYES TWICE A DAY  11  . EPINEPHrine 0.3 mg/0.3 mL IJ SOAJ injection     . phentermine (ADIPEX-P) 37.5 MG tablet Take 37.5 mg by mouth daily.     No current  facility-administered medications on file prior to visit.      Past Medical History:  Diagnosis Date  . Glaucoma    left eye  . Hyperlipidemia   . Hypertension    No Known Allergies  Social History   Socioeconomic History  . Marital status: Married    Spouse name: Not on file  . Number of children: Not on file  . Years of education: Not on file  . Highest education level: Not on file  Occupational History  . Not on file  Social Needs  . Financial  resource strain: Not on file  . Food insecurity:    Worry: Not on file    Inability: Not on file  . Transportation needs:    Medical: Not on file    Non-medical: Not on file  Tobacco Use  . Smoking status: Never Smoker  . Smokeless tobacco: Never Used  Substance and Sexual Activity  . Alcohol use: No  . Drug use: No  . Sexual activity: Not on file  Lifestyle  . Physical activity:    Days per week: Not on file    Minutes per session: Not on file  . Stress: Not on file  Relationships  . Social connections:    Talks on phone: Not on file    Gets together: Not on file    Attends religious service: Not on file    Active member of club or organization: Not on file    Attends meetings of clubs or organizations: Not on file    Relationship status: Not on file  Other Topics Concern  . Not on file  Social History Narrative  . Not on file    Vitals:   04/14/18 0851  BP: 122/78  Pulse: 60  Resp: 12  Temp: 97.9 F (36.6 C)  SpO2: 99%   Body mass index is 30.89 kg/m.   Wt Readings from Last 3 Encounters:  04/14/18 174 lb 6 oz (79.1 kg)  01/15/17 204 lb (92.5 kg)  03/29/16 197 lb (89.4 kg)    Physical Exam  Nursing note and vitals reviewed. Constitutional: She is oriented to person, place, and time. She appears well-developed. No distress.  HENT:  Head: Normocephalic and atraumatic.  Mouth/Throat: Oropharynx is clear and moist and mucous membranes are normal.  Eyes: Pupils are equal, round, and reactive to light. Conjunctivae are normal.  Neck: No thyromegaly present.  Cardiovascular: Normal rate and regular rhythm.  No murmur heard. Pulses:      Dorsalis pedis pulses are 2+ on the right side and 2+ on the left side.  Respiratory: Effort normal and breath sounds normal. No respiratory distress.  GI: Soft. She exhibits no mass. There is no hepatomegaly. There is no abdominal tenderness.  Musculoskeletal:        General: No edema.  Lymphadenopathy:    She has no  cervical adenopathy.  Neurological: She is alert and oriented to person, place, and time. She has normal strength. No cranial nerve deficit. Gait normal.  Skin: Skin is warm. No rash noted. No erythema.  Psychiatric: Her mood appears anxious.  Well groomed, good eye contact.     ASSESSMENT AND PLAN:   Ms. RAKISHA PINCOCK was seen today to establish care and for chronic disease management.   Orders Placed This Encounter  Procedures  . CBC with Differential/Platelet  . Hepatic function panel  . Lipid panel  . TSH    The 10-year ASCVD risk score Mikey Bussing DC Brooke Bonito., et al.,  2013) is: 14.3%   Values used to calculate the score:     Age: 19 years     Sex: Female     Is Non-Hispanic African American: Yes     Diabetic: No     Tobacco smoker: No     Systolic Blood Pressure: 628 mmHg     Is BP treated: Yes     HDL Cholesterol: 47.4 mg/dL     Total Cholesterol: 291 mg/dL  Hyperlipidemia Problem is not well controlled. She just resumed Lipitor 20 mg a week ago, no changes in current dose. We discussed her risk factors for CVD. Follow-up in 3 to 4 months.  Class 1 obesity with serious comorbidity and body mass index (BMI) of 30.0 to 30.9 in adult We discussed some side effects of Phentermine. I do not recommend strict diets as the one she has followed for 4 months. Recommend considering 1200 cal/day instead 800 and/or weight watchers. She is currently following with weight clinic provider.  Constipation Improved with OTC "Prune Lax." Adequate fluid and fiber intake.     Return in about 4 months (around 08/13/2018) for Labs a week before,fasting. HLD.    Khaleem Burchill G. [place], MD  [facility]. [place] office.

## 2018-04-14 NOTE — Assessment & Plan Note (Signed)
Problem is not well controlled. She just resumed Lipitor 20 mg a week ago, no changes in current dose. We discussed her risk factors for CVD. Follow-up in 3 to 4 months.

## 2018-08-10 ENCOUNTER — Other Ambulatory Visit: Payer: Self-pay

## 2018-08-10 ENCOUNTER — Other Ambulatory Visit (INDEPENDENT_AMBULATORY_CARE_PROVIDER_SITE_OTHER): Payer: Medicare Other

## 2018-08-10 DIAGNOSIS — R946 Abnormal results of thyroid function studies: Secondary | ICD-10-CM

## 2018-08-10 DIAGNOSIS — E785 Hyperlipidemia, unspecified: Secondary | ICD-10-CM

## 2018-08-10 DIAGNOSIS — R7989 Other specified abnormal findings of blood chemistry: Secondary | ICD-10-CM

## 2018-08-10 LAB — HEPATIC FUNCTION PANEL
ALT: 17 U/L (ref 0–35)
AST: 19 U/L (ref 0–37)
Albumin: 4.1 g/dL (ref 3.5–5.2)
Alkaline Phosphatase: 77 U/L (ref 39–117)
Bilirubin, Direct: 0.1 mg/dL (ref 0.0–0.3)
Total Bilirubin: 0.7 mg/dL (ref 0.2–1.2)
Total Protein: 6.7 g/dL (ref 6.0–8.3)

## 2018-08-10 LAB — CBC WITH DIFFERENTIAL/PLATELET
Basophils Absolute: 0 10*3/uL (ref 0.0–0.1)
Basophils Relative: 0.6 % (ref 0.0–3.0)
Eosinophils Absolute: 0.1 10*3/uL (ref 0.0–0.7)
Eosinophils Relative: 1.3 % (ref 0.0–5.0)
HCT: 40.7 % (ref 36.0–46.0)
Hemoglobin: 13.9 g/dL (ref 12.0–15.0)
Lymphocytes Relative: 45.1 % (ref 12.0–46.0)
Lymphs Abs: 2.2 10*3/uL (ref 0.7–4.0)
MCHC: 34.1 g/dL (ref 30.0–36.0)
MCV: 86.2 fl (ref 78.0–100.0)
Monocytes Absolute: 0.3 10*3/uL (ref 0.1–1.0)
Monocytes Relative: 5.6 % (ref 3.0–12.0)
Neutro Abs: 2.3 10*3/uL (ref 1.4–7.7)
Neutrophils Relative %: 47.4 % (ref 43.0–77.0)
Platelets: 209 10*3/uL (ref 150.0–400.0)
RBC: 4.72 Mil/uL (ref 3.87–5.11)
RDW: 13 % (ref 11.5–15.5)
WBC: 4.9 10*3/uL (ref 4.0–10.5)

## 2018-08-10 LAB — TSH: TSH: 3.59 u[IU]/mL (ref 0.35–4.50)

## 2018-08-10 LAB — LIPID PANEL
Cholesterol: 188 mg/dL (ref 0–200)
HDL: 61 mg/dL (ref >39.00)
LDL Cholesterol: 112 mg/dL — ABNORMAL HIGH (ref 0–99)
NonHDL: 126.86
Total CHOL/HDL Ratio: 3
Triglycerides: 74 mg/dL (ref 0.0–149.0)
VLDL: 14.8 mg/dL (ref 0.0–40.0)

## 2018-08-16 NOTE — Progress Notes (Signed)
HPI:   Molly Fuller is a 68 y.o. female, who is here today for chronic disease management.  She was last seen on 04/14/18  She had blood work a few days ago.  HLD: She is on Lipitor 20 mg daily, she sometimes skips medication. Tolerating medication well overall,sometimes causes frontal HA but mild. Denies associated visual changes, nausea, vomiting, or focal weakness.  Lab Results  Component Value Date   CHOL 188 08/10/2018   HDL 61.00 08/10/2018   LDLCALC 112 (H) 08/10/2018   LDLDIRECT 262.7 12/29/2012   TRIG 74.0 08/10/2018   CHOLHDL 3 08/10/2018   Obesity: She is no longer taking phentermine. She has tried to be consistent with following a healthful diet and exercising regularly. She walks about 3 times per week.   Lab Results  Component Value Date   ALT 17 08/10/2018   AST 19 08/10/2018   ALKPHOS 77 08/10/2018   BILITOT 0.7 08/10/2018    Lab Results  Component Value Date   WBC 4.9 08/10/2018   HGB 13.9 08/10/2018   HCT 40.7 08/10/2018   MCV 86.2 08/10/2018   PLT 209.0 08/10/2018   Lab Results  Component Value Date   TSH 3.59 08/10/2018    Preventive Medicare visit,she has not had one in the past year.  She lives with her husband. Independent ADL's and IADL's. No falls in the past year and denies depression symptoms.  CVD risk: Age and HLD.  Functional Status Survey: Is the patient deaf or have difficulty hearing?: No Does the patient have difficulty seeing, even when wearing glasses/contacts?: No Does the patient have difficulty concentrating, remembering, or making decisions?: No Does the patient have difficulty walking or climbing stairs?: No Does the patient have difficulty dressing or bathing?: No Does the patient have difficulty doing errands alone such as visiting a doctor's office or shopping?: No  Fall Risk  08/17/2018 01/02/2018 03/29/2016 02/15/2016 11/30/2015  Falls in the past year? 0 0 No No No  Comment - Emmi Telephone  Survey: data to providers prior to load - - -  Number falls in past yr: 0 - - - -  Injury with Fall? 0 - - - -  Follow up Education provided - - - -    Providers she sees regularly:  Eye care provider: Dr Katy Fitch due to Hx of glaucoma. Last f/u 02/2018 Optometrist, Dr [place], last seen 08/03/18.  Depression screen [facility] 2/9 08/17/2018  Decreased Interest 0  Down, Depressed, Hopeless 0  PHQ - 2 Score 0    Mini-Cog - 08/17/18 0915    Normal clock drawing test?  yes        Hearing Screening   125Hz  250Hz  500Hz  1000Hz  2000Hz  3000Hz  4000Hz  6000Hz  8000Hz   Right ear:           Left ear:             Visual Acuity Screening   Right eye Left eye Both eyes  Without correction:     With correction: 20/70 20/70 20/70     Review of Systems  Constitutional: Negative for chills and fever.  HENT: Negative for mouth sores and sore throat.   Eyes: Negative for pain and redness.  Respiratory: Negative for cough, shortness of breath and wheezing.   Cardiovascular: Negative for chest pain, palpitations and leg swelling.  Gastrointestinal: Negative for abdominal pain and blood in stool.  Endocrine: Negative for cold intolerance and heat intolerance.  Genitourinary: Negative for decreased urine volume  and hematuria.  Musculoskeletal: Negative for gait problem and myalgias.  Skin: Negative for rash.  Neurological: Negative for facial asymmetry and speech difficulty.  Rest see pertinent positives and negatives per HPI.   Current Outpatient Medications on File Prior to Visit  Medication Sig Dispense Refill  . bimatoprost (LUMIGAN) 0.03 % ophthalmic solution Place 1 drop into both eyes at bedtime.    . brimonidine-timolol (COMBIGAN) 0.2-0.5 % ophthalmic solution Place 1 drop into both eyes every 12 (twelve) hours.    . dorzolamide (TRUSOPT) 2 % ophthalmic solution INSTILL 1 DROP INTO BOTH EYES TWICE A DAY  11  . EPINEPHrine 0.3 mg/0.3 mL IJ SOAJ injection     . latanoprost (XALATAN) 0.005 %  ophthalmic solution PLACE 1 DROP INTO BOTH EYES EVERY EVENING     No current facility-administered medications on file prior to visit.      Past Medical History:  Diagnosis Date  . Glaucoma    left eye  . Hyperlipidemia   . Hypertension    No Known Allergies  Social History   Socioeconomic History  . Marital status: Married    Spouse name: Not on file  . Number of children: Not on file  . Years of education: Not on file  . Highest education level: Not on file  Occupational History  . Not on file  Social Needs  . Financial resource strain: Not on file  . Food insecurity    Worry: Not on file    Inability: Not on file  . Transportation needs    Medical: Not on file    Non-medical: Not on file  Tobacco Use  . Smoking status: Never Smoker  . Smokeless tobacco: Never Used  Substance and Sexual Activity  . Alcohol use: No  . Drug use: No  . Sexual activity: Not on file  Lifestyle  . Physical activity    Days per week: Not on file    Minutes per session: Not on file  . Stress: Not on file  Relationships  . Social Herbalist on phone: Not on file    Gets together: Not on file    Attends religious service: Not on file    Active member of club or organization: Not on file    Attends meetings of clubs or organizations: Not on file    Relationship status: Not on file  Other Topics Concern  . Not on file  Social History Narrative  . Not on file    Vitals:   08/17/18 0905  BP: 120/78  Pulse: 65  Resp: 12  Temp: 98.5 F (36.9 C)  SpO2: 96%   Body mass index is 29.94 kg/m. Wt Readings from Last 3 Encounters:  08/17/18 169 lb (76.7 kg)  04/14/18 174 lb 6 oz (79.1 kg)  01/15/17 204 lb (92.5 kg)    Physical Exam  Nursing note and vitals reviewed. Constitutional: She is oriented to person, place, and time. She appears well-developed. No distress.  HENT:  Head: Normocephalic and atraumatic.  Mouth/Throat: Oropharynx is clear and moist and mucous  membranes are normal.  Eyes: Pupils are equal, round, and reactive to light. Conjunctivae are normal.  Cardiovascular: Normal rate and regular rhythm.  No murmur heard. Pulses:      Dorsalis pedis pulses are 2+ on the right side and 2+ on the left side.  Respiratory: Effort normal and breath sounds normal. No respiratory distress.  GI: Soft. She exhibits no mass. There is no hepatomegaly. There  is no abdominal tenderness.  Musculoskeletal:        General: No edema.  Lymphadenopathy:    She has no cervical adenopathy.  Neurological: She is alert and oriented to person, place, and time. She has normal strength. No cranial nerve deficit. Gait normal.  Skin: Skin is warm. No rash noted. No erythema.  Psychiatric: She has a normal mood and affect.  Well groomed, good eye contact.    ASSESSMENT AND PLAN:  Shantea was seen today for 4 month follow-up.  Diagnoses and all orders for this visit:  Medicare annual wellness visit, subsequent We discussed the importance of staying active, physically and mentally, as well as the benefits of a healthy/balance diet. Low impact exercise that involve stretching and strengthing are ideal. Vaccines up to date. We discussed preventive screening for the next 5-10 years, summery of recommendations given in AVS:  Refused DEXA. Fall prevention. Mammogram 03/20/18 DEXA 04/2013, normal. Colonoscopy 05/2012. Advance directives and end of life discussed, she has power of attorney and living will.    Class 1 obesity with serious comorbidity and body mass index (BMI) of 30.0 to 30.9 in adult She is not longer on Phentermine. Lost about 5 Lb since her last OV,03/2018. We discussed benefits of wt loss as well as adverse effects of obesity. Consistency with healthy diet and physical activity recommended.    Hyperlipidemia Problem is better controlled. No changes in Lipitor 20 mg daily, side effects discussed.   Return in about 1 year (around 08/17/2019)  for F/U, labs before appt.   -Ms. Royston Sinner Butt was advised to return sooner than planned today if new concerns arise.     G. Martinique, MD  [facility]. [place] office.

## 2018-08-17 ENCOUNTER — Ambulatory Visit (INDEPENDENT_AMBULATORY_CARE_PROVIDER_SITE_OTHER): Payer: Medicare Other | Admitting: Family Medicine

## 2018-08-17 ENCOUNTER — Other Ambulatory Visit: Payer: Self-pay

## 2018-08-17 ENCOUNTER — Encounter: Payer: Self-pay | Admitting: Family Medicine

## 2018-08-17 VITALS — BP 120/78 | HR 65 | Temp 98.5°F | Resp 12 | Ht 63.0 in | Wt 169.0 lb

## 2018-08-17 DIAGNOSIS — Z0000 Encounter for general adult medical examination without abnormal findings: Secondary | ICD-10-CM

## 2018-08-17 DIAGNOSIS — E785 Hyperlipidemia, unspecified: Secondary | ICD-10-CM

## 2018-08-17 DIAGNOSIS — E6609 Other obesity due to excess calories: Secondary | ICD-10-CM | POA: Diagnosis not present

## 2018-08-17 DIAGNOSIS — Z6830 Body mass index (BMI) 30.0-30.9, adult: Secondary | ICD-10-CM

## 2018-08-17 MED ORDER — ATORVASTATIN CALCIUM 20 MG PO TABS: 20.0000 mg | ORAL_TABLET | Freq: Every day | ORAL | 3 refills | Status: DC

## 2018-08-17 NOTE — Assessment & Plan Note (Signed)
She is not longer on Phentermine. Lost about 5 Lb since her last OV,03/2018. We discussed benefits of wt loss as well as adverse effects of obesity. Consistency with healthy diet and physical activity recommended.

## 2018-08-17 NOTE — Assessment & Plan Note (Signed)
Problem is better controlled. No changes in Lipitor 20 mg daily, side effects discussed.

## 2018-08-17 NOTE — Patient Instructions (Addendum)
A few things to remember from today's visit:   Hyperlipidemia, unspecified hyperlipidemia type  Medicare annual wellness visit, subsequent   A few tips:  -As we age balance is not as good as it was, so there is a higher risks for falls. Please remove small rugs and furniture that is "in your way" and could increase the risk of falls. Stretching exercises may help with fall prevention: Yoga and Tai Chi are some examples. Low impact exercise is better, so you are not very achy the next day.  -Sun screen and avoidance of direct sun light recommended. Caution with dehydration, if working outdoors be sure to drink enough fluids.  - Some medications are not safe as we age, increases the risk of side effects and can potentially interact with other medication you are also taken;  including some of over the counter medications. Be sure to let me know when you start a new medication even if it is a dietary/vitamin supplement.   -Healthy diet low in red meet/animal fat and sugar + regular physical activity is recommended.       Screening schedule for the next 5-10 years:  Colonoscopy in 2024.  Glaucoma screening/eye exam every 1-2 years.  Mammogram for breast cancer screening annually.  Flu vaccine annually.  Diabetes and cholesterol screening   Fall prevention    Please be sure medication list is accurate. If a new problem present, please set up appointment sooner than planned today.

## 2018-10-16 ENCOUNTER — Telehealth: Payer: Self-pay | Admitting: Family Medicine

## 2018-10-20 ENCOUNTER — Telehealth (INDEPENDENT_AMBULATORY_CARE_PROVIDER_SITE_OTHER): Payer: Medicare Other | Admitting: Family Medicine

## 2018-10-20 ENCOUNTER — Other Ambulatory Visit: Payer: Self-pay

## 2018-10-20 DIAGNOSIS — R7989 Other specified abnormal findings of blood chemistry: Secondary | ICD-10-CM | POA: Diagnosis not present

## 2018-10-20 DIAGNOSIS — E785 Hyperlipidemia, unspecified: Secondary | ICD-10-CM | POA: Diagnosis not present

## 2018-10-20 DIAGNOSIS — Z6834 Body mass index (BMI) 34.0-34.9, adult: Secondary | ICD-10-CM | POA: Diagnosis not present

## 2018-10-20 DIAGNOSIS — E6609 Other obesity due to excess calories: Secondary | ICD-10-CM | POA: Diagnosis not present

## 2018-10-20 NOTE — Progress Notes (Signed)
Virtual Visit via Video Note   I connected with Molly Fuller on 10/20/18 by a video enabled telemedicine application and verified that I am speaking with the correct person using two identifiers.  Location patient: home Location provider:work or home office Persons participating in the virtual visit: patient, provider  I discussed the limitations of evaluation and management by telemedicine and the availability of in person appointments. The patient expressed understanding and agreed to proceed.   HPI: Molly Fuller is a 68 yo female with Hx of HLD,HLD,and glaucoma who is concerned about some results reported in recent blood work done by [employer]. HLD,she is on Atorvastatin 20 mg daily. She is following a healthful diet and walking daily.  10/13/18 TC 188,HDL 72,LDL 99,and TG 71.  Denies severe/frequent headache, visual changes, chest pain, dyspnea, palpitation, claudication, focal weakness, or edema.  Extensive blood work done "everything normal" except elevated pro BNP at 171. No Hx of CHF. Denies orthopnea or PND. No gross hematuria,decreased urine output,or foam in urine. No recent infectious process.  She has been taking phentermine until recently. She stopped medication because she though this could be related to lab abnormality.She has followed with wt loss clinic.  HTN ,she is on non pharmacologic treatment.  ROS: See pertinent positives and negatives per HPI.  Past Medical History:  Diagnosis Date  . Glaucoma    left eye  . Hyperlipidemia   . Hypertension     Past Surgical History:  Procedure Laterality Date  . no prior surgery      Family History  Problem Relation Age of Onset  . Colon cancer Paternal Grandmother 25  . Hypertension Other   . Diabetes Other   . Breast cancer Neg Hx     Social History   Socioeconomic History  . Marital status: Married    Spouse name: Not on file  . Number of children: Not on file  . Years of education: Not on file  .  Highest education level: Not on file  Occupational History  . Not on file  Social Needs  . Financial resource strain: Not on file  . Food insecurity    Worry: Not on file    Inability: Not on file  . Transportation needs    Medical: Not on file    Non-medical: Not on file  Tobacco Use  . Smoking status: Never Smoker  . Smokeless tobacco: Never Used  Substance and Sexual Activity  . Alcohol use: No  . Drug use: No  . Sexual activity: Not on file  Lifestyle  . Physical activity    Days per week: Not on file    Minutes per session: Not on file  . Stress: Not on file  Relationships  . Social Herbalist on phone: Not on file    Gets together: Not on file    Attends religious service: Not on file    Active member of club or organization: Not on file    Attends meetings of clubs or organizations: Not on file    Relationship status: Not on file  . Intimate partner violence    Fear of current or ex partner: Not on file    Emotionally abused: Not on file    Physically abused: Not on file    Forced sexual activity: Not on file  Other Topics Concern  . Not on file  Social History Narrative  . Not on file    Current Outpatient Medications:  .  atorvastatin (LIPITOR) 20 MG tablet, Take 1 tablet (20 mg total) by mouth daily., Disp: 90 tablet, Rfl: 3 .  bimatoprost (LUMIGAN) 0.03 % ophthalmic solution, Place 1 drop into both eyes at bedtime., Disp: , Rfl:  .  brimonidine-timolol (COMBIGAN) 0.2-0.5 % ophthalmic solution, Place 1 drop into both eyes every 12 (twelve) hours., Disp: , Rfl:  .  dorzolamide (TRUSOPT) 2 % ophthalmic solution, INSTILL 1 DROP INTO BOTH EYES TWICE A DAY, Disp: , Rfl: 11 .  EPINEPHrine 0.3 mg/0.3 mL IJ SOAJ injection, , Disp: , Rfl:  .  latanoprost (XALATAN) 0.005 % ophthalmic solution, PLACE 1 DROP INTO BOTH EYES EVERY EVENING, Disp: , Rfl:   EXAM:  VITALS per patient if applicable:She does not have a BP monitor.  GENERAL: alert, oriented,  appears well and in no acute distress  HEENT: atraumatic, conjunttiva clear, no obvious abnormalities on inspection.  NECK: normal movements of the head and neck  LUNGS: on inspection no signs of respiratory distress, breathing rate appears normal, no obvious gross SOB, gasping or wheezing  CV: no obvious cyanosis  Molly: moves all visible extremities without noticeable abnormality  PSYCH/NEURO: pleasant and cooperative, no obvious depression or anxiety, speech and thought processing grossly intact  ASSESSMENT AND PLAN:  Discussed the following assessment and plan:  Hyperlipidemia, unspecified hyperlipidemia type Problem is well controlled. Continue Atorvastatin 20 mg daily. F/U in a year.  Elevated brain natriuretic peptide (BNP) level - Plan: ECHOCARDIOGRAM COMPLETE Possible etiologies discussed. We discussed a couple options: Having test repeated given the fact she has not symptoms that suggest CHF OR Echo,she prefers the latter one.  Class 1 obesity due to excess calories without serious comorbidity with body mass index (BMI) of 34.0 to 34.9 in adult We discussed benefits of wt loss as well as adverse effects of obesity. First line of treatment diet and exercise. Side effects of phentermine discussed.  Consistency with healthy diet and physical activity recommended.   25 min face to face OV. > 50% was dedicated to discussion of Dx, prognosis, and some side effects of wt loss meds.   I discussed the assessment and treatment plan with the patient. She was provided an opportunity to ask questions and all were answered. She agreed with the plan and demonstrated an understanding of the instructions.     Return in about 1 year (around 10/20/2019) for f/u and Medicare .    Jonathandavid Marlett Martinique, MD

## 2018-10-22 ENCOUNTER — Ambulatory Visit (HOSPITAL_COMMUNITY): Payer: Medicare Other | Attending: Internal Medicine

## 2018-10-22 ENCOUNTER — Other Ambulatory Visit: Payer: Self-pay

## 2018-10-22 ENCOUNTER — Encounter: Payer: Self-pay | Admitting: Family Medicine

## 2018-10-22 DIAGNOSIS — I519 Heart disease, unspecified: Secondary | ICD-10-CM | POA: Insufficient documentation

## 2018-10-22 DIAGNOSIS — E669 Obesity, unspecified: Secondary | ICD-10-CM | POA: Diagnosis not present

## 2018-10-22 DIAGNOSIS — I358 Other nonrheumatic aortic valve disorders: Secondary | ICD-10-CM | POA: Diagnosis not present

## 2018-10-22 DIAGNOSIS — I5030 Unspecified diastolic (congestive) heart failure: Secondary | ICD-10-CM | POA: Insufficient documentation

## 2018-10-22 DIAGNOSIS — R7989 Other specified abnormal findings of blood chemistry: Secondary | ICD-10-CM | POA: Diagnosis not present

## 2018-10-22 DIAGNOSIS — E785 Hyperlipidemia, unspecified: Secondary | ICD-10-CM | POA: Diagnosis not present

## 2018-10-22 DIAGNOSIS — I10 Essential (primary) hypertension: Secondary | ICD-10-CM | POA: Insufficient documentation

## 2018-10-28 ENCOUNTER — Telehealth: Payer: Self-pay | Admitting: Family Medicine

## 2018-10-28 NOTE — Telephone Encounter (Signed)
Copied from [place] [phone]. Topic: General - Other >> Oct 28, 2018 10:10 AM Molly Fuller wrote: Pt called to ask about a form that was sent from [employer] to be filled out. She was told they had not received the form back ,it was a medical form about her health fro life insurance

## 2018-10-28 NOTE — Telephone Encounter (Signed)
Copied from [place] [phone]. Topic: General - Other >> Oct 28, 2018 10:10 AM Carolyn Stare wrote: Pt called to ask about a form that was sent from [employer] to be filled out. She was told they had not received the form back ,it was a medical form about her health fro life insurance

## 2018-11-09 ENCOUNTER — Encounter: Payer: Self-pay | Admitting: *Deleted

## 2018-11-09 NOTE — Telephone Encounter (Signed)
Patient informed that Dr. Martinique has not received a form and to have them fax one to the office for completion.

## 2019-02-19 HISTORY — PX: BREAST BIOPSY: SHX20

## 2019-03-15 DIAGNOSIS — Z23 Encounter for immunization: Secondary | ICD-10-CM | POA: Diagnosis not present

## 2019-04-05 DIAGNOSIS — Z23 Encounter for immunization: Secondary | ICD-10-CM | POA: Diagnosis not present

## 2019-05-05 ENCOUNTER — Other Ambulatory Visit: Payer: Self-pay | Admitting: Family Medicine

## 2019-05-05 DIAGNOSIS — Z1231 Encounter for screening mammogram for malignant neoplasm of breast: Secondary | ICD-10-CM

## 2019-05-28 ENCOUNTER — Ambulatory Visit
Admission: RE | Admit: 2019-05-28 | Discharge: 2019-05-28 | Disposition: A | Discharge status: Home / Self Care | Payer: Medicare Other | Source: Ambulatory Visit | Attending: Family Medicine | Admitting: Family Medicine

## 2019-05-28 ENCOUNTER — Other Ambulatory Visit: Payer: Self-pay

## 2019-05-28 DIAGNOSIS — Z1231 Encounter for screening mammogram for malignant neoplasm of breast: Secondary | ICD-10-CM | POA: Diagnosis not present

## 2019-05-31 ENCOUNTER — Other Ambulatory Visit: Payer: Self-pay | Admitting: Family Medicine

## 2019-05-31 DIAGNOSIS — R928 Other abnormal and inconclusive findings on diagnostic imaging of breast: Secondary | ICD-10-CM

## 2019-06-14 ENCOUNTER — Other Ambulatory Visit: Payer: Self-pay

## 2019-06-14 ENCOUNTER — Ambulatory Visit
Admission: RE | Admit: 2019-06-14 | Discharge: 2019-06-14 | Disposition: A | Discharge status: Home / Self Care | Payer: Medicare Other | Source: Ambulatory Visit | Attending: Family Medicine | Admitting: Family Medicine

## 2019-06-14 ENCOUNTER — Other Ambulatory Visit: Payer: Self-pay | Admitting: Family Medicine

## 2019-06-14 DIAGNOSIS — R928 Other abnormal and inconclusive findings on diagnostic imaging of breast: Secondary | ICD-10-CM

## 2019-06-14 DIAGNOSIS — N6489 Other specified disorders of breast: Secondary | ICD-10-CM | POA: Diagnosis not present

## 2019-06-15 ENCOUNTER — Ambulatory Visit
Admission: RE | Admit: 2019-06-15 | Discharge: 2019-06-15 | Disposition: A | Discharge status: Home / Self Care | Payer: Medicare Other | Source: Ambulatory Visit | Attending: Family Medicine | Admitting: Family Medicine

## 2019-06-15 ENCOUNTER — Other Ambulatory Visit: Payer: Self-pay

## 2019-06-15 DIAGNOSIS — R928 Other abnormal and inconclusive findings on diagnostic imaging of breast: Secondary | ICD-10-CM

## 2019-06-15 DIAGNOSIS — N6321 Unspecified lump in the left breast, upper outer quadrant: Secondary | ICD-10-CM | POA: Diagnosis not present

## 2019-06-15 DIAGNOSIS — C50412 Malignant neoplasm of upper-outer quadrant of left female breast: Secondary | ICD-10-CM | POA: Diagnosis not present

## 2019-06-15 DIAGNOSIS — Z170 Estrogen receptor positive status [ER+]: Secondary | ICD-10-CM | POA: Diagnosis not present

## 2019-06-16 ENCOUNTER — Encounter: Payer: Self-pay | Admitting: *Deleted

## 2019-06-17 ENCOUNTER — Telehealth: Payer: Self-pay | Admitting: Hematology

## 2019-06-17 NOTE — Telephone Encounter (Signed)
Spoke to patient to confirm afternoon [employer] appointment for 5/5 packet will be emailed to patient

## 2019-06-21 NOTE — Progress Notes (Signed)
[place]   [phone] Fax:([phone] [place] Note   Patient Care Team: [place], Betty G, MD as PCP - General (Family Medicine) Clent Jacks, MD (Ophthalmology) Mauro Kaufmann, RN as Oncology Nurse Navigator Rockwell Germany, RN as Oncology Nurse Navigator Stark Klein, MD as Consulting Physician (General Surgery) Truitt Merle, MD as Consulting Physician (Hematology) Kyung Rudd, MD as Consulting Physician (Radiation Oncology)  Date of Service:  06/23/2019   CHIEF COMPLAINTS/PURPOSE OF CONSULTATION:  Newly Diagnosed Malignant neoplasm of upper-outer quadrant of left breast    Oncology History Overview Note  Cancer Staging Malignant neoplasm of upper-outer quadrant of left breast in female, estrogen receptor positive ([place]) Staging form: Breast, AJCC 8th Edition - Clinical stage from 06/15/2019: Stage IA (cT1b, cN0, cM0, G2, ER+, PR-, HER2-) - Signed by Truitt Merle, MD on 06/22/2019    Malignant neoplasm of upper-outer quadrant of left breast in female, estrogen receptor positive ([place])  06/14/2019 Mammogram   Diagnostic Mammogram 06/14/19  IMPRESSION: Suspicious mass in the 2 o'clock location of the LEFT breast 5 centimeters from the nipple. Mass is taller than wide and measures 0.8 x 0.7 x 0.8 centimeters.   06/15/2019 Cancer Staging   Staging form: Breast, AJCC 8th Edition - Clinical stage from 06/15/2019: Stage IA (cT1b, cN0, cM0, G2, ER+, PR-, HER2-) - Signed by Truitt Merle, MD on 06/22/2019   06/15/2019 Initial Biopsy   Diagnosis 06/15/19 Breast, left, needle core biopsy, 2 o'clock - INVASIVE MAMMARY CARCINOMA. - SEE COMMENT. Microscopic Comment The carcinoma appears grade 2. The longest span of tumor is 0.7 cm. An E-Cadherin and a breast prognostic profile will be performed and the results reported separately. Dr. Vicente Males has reviewed the case and concurs with this interpretation.    06/15/2019 Receptors her2   PROGNOSTIC  INDICATORS 06/15/19 Results: IMMUNOHISTOCHEMICAL AND MORPHOMETRIC ANALYSIS PERFORMED MANUALLY The tumor cells are EQUIVOCAL for Her2 (2+). HER2 by FISH will be preformed and the results reported separately Estrogen Receptor: 100%, POSITIVE, STRONG STAINING INTENSITY Progesterone Receptor: 0%, NEGATIVE Proliferation Marker Ki67: 10% FLUORESCENCE IN-SITU HYBRIDIZATION Results: GROUP 5: HER2 **NEGATIVE**     06/22/2019 Initial Diagnosis   Malignant neoplasm of upper-outer quadrant of left breast in female, estrogen receptor positive ([place])      HISTORY OF PRESENTING ILLNESS:  Molly Fuller 69 y.o. female is a here because of newly diagnosed left breast cancer. The patient presents to the Breast clinic today accompanied by her husband.   Her mass was found by screening mammogram. She has been having yearly mammograms and this is her first abnormal screening. She denies and breast changes or pain or mass felt. She denies abdominal, chest, or GI issues or chronic pain. She overall feels at baseline. She is overall active.   Socially she is married with 2 adult children. She is a non smoker, does not drink or do recreational drugs. She is a retired Set designer for [employer]  They have a PMHx of Glaucoma. She no longer has HTN. I reviewed her medication list with her, she is on cholesterol medication. She denies history of surgery. Her PGM had colon cancer. She notes her menopause was manageable with hot flashes.    GYN HISTORY   Menarchal: 10 LMP: 48 Contraceptive: No HRT: No G2P2: - first at 37, no breast feeding    REVIEW OF SYSTEMS:    Constitutional: Denies fevers, chills or abnormal night sweats Eyes: Denies blurriness of vision, double vision  or watery eyes Ears, nose, mouth, throat, and face: Denies mucositis or sore throat Respiratory: Denies cough, dyspnea or wheezes Cardiovascular: Denies palpitation, chest discomfort or lower extremity  swelling Gastrointestinal:  Denies nausea, heartburn or change in bowel habits Skin: Denies abnormal skin rashes Lymphatics: Denies new lymphadenopathy or easy bruising Neurological:Denies numbness, tingling or new weaknesses Behavioral/Psych: Mood is stable, no new changes  All other systems were reviewed with the patient and are negative.   MEDICAL HISTORY:  Past Medical History:  Diagnosis Date  . Glaucoma    left eye  . Hyperlipidemia   . Hypertension     SURGICAL HISTORY: Past Surgical History:  Procedure Laterality Date  . no prior surgery      SOCIAL HISTORY: Social History   Socioeconomic History  . Marital status: Married    Spouse name: Not on file  . Number of children: 2  . Years of education: Not on file  . Highest education level: Not on file  Occupational History  . Occupation: retired Associate Professor   Tobacco Use  . Smoking status: Never Smoker  . Smokeless tobacco: Never Used  Substance and Sexual Activity  . Alcohol use: No  . Drug use: No  . Sexual activity: Not on file  Other Topics Concern  . Not on file  Social History Narrative  . Not on file   Social Determinants of Health   Financial Resource Strain:   . Difficulty of Paying Living Expenses:   Food Insecurity:   . Worried About Charity fundraiser in the Last Year:   . Arboriculturist in the Last Year:   Transportation Needs:   . Film/video editor (Medical):   Marland Kitchen Lack of Transportation (Non-Medical):   Physical Activity:   . Days of Exercise per Week:   . Minutes of Exercise per Session:   Stress:   . Feeling of Stress :   Social Connections:   . Frequency of Communication with Friends and Family:   . Frequency of Social Gatherings with Friends and Family:   . Attends Religious Services:   . Active Member of Clubs or Organizations:   . Attends Archivist Meetings:   Marland Kitchen Marital Status:   Intimate Partner Violence:   . Fear of Current or Ex-Partner:   .  Emotionally Abused:   Marland Kitchen Physically Abused:   . Sexually Abused:     FAMILY HISTORY: Family History  Problem Relation Age of Onset  . Colon cancer Paternal Grandmother 17  . Hypertension Other   . Diabetes Other   . Breast cancer Neg Hx     ALLERGIES:  is allergic to bee venom.  MEDICATIONS:  Current Outpatient Medications  Medication Sig Dispense Refill  . atorvastatin (LIPITOR) 20 MG tablet Take 1 tablet (20 mg total) by mouth daily. 90 tablet 3  . bimatoprost (LUMIGAN) 0.03 % ophthalmic solution Place 1 drop into both eyes at bedtime.    . brimonidine-timolol (COMBIGAN) 0.2-0.5 % ophthalmic solution Place 1 drop into both eyes every 12 (twelve) hours.    . dorzolamide (TRUSOPT) 2 % ophthalmic solution INSTILL 1 DROP INTO BOTH EYES TWICE A DAY  11  . EPINEPHrine 0.3 mg/0.3 mL IJ SOAJ injection     . latanoprost (XALATAN) 0.005 % ophthalmic solution PLACE 1 DROP INTO BOTH EYES EVERY EVENING     No current facility-administered medications for this visit.    PHYSICAL EXAMINATION: ECOG PERFORMANCE STATUS: 0 - Asymptomatic  Vitals:  06/23/19 1300  BP: (!) 149/73  Pulse: (!) 51  Resp: 17  Temp: 97.8 F (36.6 C)  SpO2: 100%   Filed Weights   06/23/19 1300  Weight: 189 lb 9.6 oz (86 kg)    GENERAL:alert, no distress and comfortable SKIN: skin color, texture, turgor are normal, no rashes or significant lesions EYES: normal, Conjunctiva are pink and non-injected, sclera clear  NECK: supple, thyroid normal size, non-tender, without nodularity LYMPH:  no palpable lymphadenopathy in the cervical, axillary  LUNGS: clear to auscultation and percussion with normal breathing effort HEART: regular rate & rhythm and no murmurs and no lower extremity edema ABDOMEN:abdomen soft, non-tender and normal bowel sounds Musculoskeletal:no cyanosis of digits and no clubbing  NEURO: alert & oriented x 3 with fluent speech, no focal motor/sensory deficits BREAST: (+) Chronic b/l nipple  inversion. No palpable mass, nodules or adenopathy bilaterally. Breast exam benign.  LABORATORY DATA:  I have reviewed the data as listed CBC Latest Ref Rng & Units 06/23/2019 08/10/2018 04/06/2018  WBC 4.0 - 10.5 K/uL 4.1 4.9 4.0  Hemoglobin 12.0 - 15.0 g/dL 13.6 13.9 14.5  Hematocrit 36.0 - 46.0 % 41.0 40.7 43.1  Platelets 150 - 400 K/uL 174 209.0 228.0    CMP Latest Ref Rng & Units 06/23/2019 08/10/2018 04/06/2018  Glucose 70 - 99 mg/dL 80 - 79  BUN 8 - 23 mg/dL 12 - 10  Creatinine 0.44 - 1.00 mg/dL 0.77 - 0.78  Sodium 135 - 145 mmol/L 142 - 140  Potassium 3.5 - 5.1 mmol/L 4.0 - 4.5  Chloride 98 - 111 mmol/L 107 - 105  CO2 22 - 32 mmol/L 27 - 27  Calcium 8.9 - 10.3 mg/dL 9.0 - 9.6  Total Protein 6.5 - 8.1 g/dL 7.0 6.7 6.6  Total Bilirubin 0.3 - 1.2 mg/dL 1.0 0.7 1.0  Alkaline Phos 38 - 126 U/L 64 77 56  AST 15 - 41 U/L 22 19 14   ALT 0 - 44 U/L 14 17 10      RADIOGRAPHIC STUDIES: I have personally reviewed the radiological images as listed and agreed with the findings in the report. US BREAST LTD UNI LEFT INC AXILLA  Result Date: 06/14/2019 CLINICAL DATA:  Patient returns after screening study for evaluation of possible LEFT breast mass. EXAM: DIGITAL DIAGNOSTIC LEFT MAMMOGRAM WITH CAD AND TOMO ULTRASOUND LEFT BREAST COMPARISON:  05/28/2019 and earlier ACR Breast Density Category b: There are scattered areas of fibroglandular density. FINDINGS: Additional 2-D and 3-D images are performed. These views show a spiculated mass in the UPPER-OUTER QUADRANT of the LEFT breast. Mammographic images were processed with CAD. Targeted ultrasound is performed, showing an oval mass with indistinct margins in the 2 o'clock location of the LEFT breast 5 centimeters from the nipple. Mass is taller than wide and measures 0.8 x 0.7 x 0.8 centimeters. Internal blood flow is present on Doppler evaluation. Evaluation of the LEFT axilla is negative for adenopathy. IMPRESSION: Suspicious mass in the 2 o'clock  location of the LEFT breast. RECOMMENDATION: Ultrasound-guided core biopsy of LEFT breast mass. I have discussed the findings and recommendations with the patient. If applicable, a reminder letter will be sent to the patient regarding the next appointment. BI-RADS CATEGORY  4: Suspicious. Electronically Signed   By: Nolon Nations M.D.   On: 06/14/2019 09:50   MM DIAG BREAST TOMO UNI LEFT  Result Date: 06/14/2019 CLINICAL DATA:  Patient returns after screening study for evaluation of possible LEFT breast mass. EXAM: DIGITAL DIAGNOSTIC LEFT MAMMOGRAM  WITH CAD AND TOMO ULTRASOUND LEFT BREAST COMPARISON:  05/28/2019 and earlier ACR Breast Density Category b: There are scattered areas of fibroglandular density. FINDINGS: Additional 2-D and 3-D images are performed. These views show a spiculated mass in the UPPER-OUTER QUADRANT of the LEFT breast. Mammographic images were processed with CAD. Targeted ultrasound is performed, showing an oval mass with indistinct margins in the 2 o'clock location of the LEFT breast 5 centimeters from the nipple. Mass is taller than wide and measures 0.8 x 0.7 x 0.8 centimeters. Internal blood flow is present on Doppler evaluation. Evaluation of the LEFT axilla is negative for adenopathy. IMPRESSION: Suspicious mass in the 2 o'clock location of the LEFT breast. RECOMMENDATION: Ultrasound-guided core biopsy of LEFT breast mass. I have discussed the findings and recommendations with the patient. If applicable, a reminder letter will be sent to the patient regarding the next appointment. BI-RADS CATEGORY  4: Suspicious. Electronically Signed   By: Nolon Nations M.D.   On: 06/14/2019 09:50   MM 3D SCREEN BREAST BILATERAL  Result Date: 05/28/2019 CLINICAL DATA:  Screening. EXAM: DIGITAL SCREENING BILATERAL MAMMOGRAM WITH TOMO AND CAD COMPARISON:  Prior studies ACR Breast Density Category b: There are scattered areas of fibroglandular density. FINDINGS: In the left breast, a possible  mass warrants further evaluation. In the right breast, no findings suspicious for malignancy. Images were processed with CAD. IMPRESSION: Further evaluation is suggested for possible mass in the left breast. RECOMMENDATION: Diagnostic mammogram and possibly ultrasound of the left breast. (Code:[id number]-L-[id number]) The patient will be contacted regarding the findings, and additional imaging will be scheduled. BI-RADS CATEGORY  0: Incomplete. Need additional imaging evaluation and/or prior mammograms for comparison. Electronically Signed   By: Margarette Canada M.D.   On: 05/28/2019 12:08   MM CLIP PLACEMENT LEFT  Result Date: 06/15/2019 CLINICAL DATA:  Post ultrasound-guided biopsy of a mass in the left breast at the 2 o'clock position. EXAM: DIAGNOSTIC LEFT MAMMOGRAM POST ULTRASOUND BIOPSY COMPARISON:  Previous exams. FINDINGS: Mammographic images were obtained following ultrasound guided biopsy of a mass in the left breast at the 2 o'clock position. A ribbon shaped biopsy marking clip is present at the site of the biopsied mass in the left breast at the 2 o'clock position. IMPRESSION: Ribbon shaped biopsy marking clip at site of biopsied mass in the left breast at the 2 o'clock position. Final Assessment: Post Procedure Mammograms for Marker Placement Electronically Signed   By: Everlean Alstrom M.D.   On: 06/15/2019 15:26   [place] LT BREAST BX W LOC DEV 1ST LESION IMG BX SPEC US GUIDE  Addendum Date: 06/16/2019   ADDENDUM REPORT: 06/16/2019 13:36 ADDENDUM: Pathology revealed GRADE II INVASIVE MAMMARY CARCINOMA of the LEFT breast, 2 o'clock. This was found to be concordant by Dr. Everlean Alstrom. Pathology results were discussed with the patient by telephone. The patient reported doing well after the biopsy with tenderness at the site. Post biopsy instructions and care were reviewed and questions were answered. The patient was encouraged to call [facility] [place] [facility] for any additional concerns. The patient  was referred to [facility] on Jun 23, 2019. Pathology results reported by Stacie Acres RN on 06/16/2019. Electronically Signed   By: Everlean Alstrom M.D.   On: 06/16/2019 13:36   Result Date: 06/16/2019 CLINICAL DATA:  69 year old female with a suspicious mass in the left breast at the 2 o'clock position. EXAM: ULTRASOUND GUIDED LEFT BREAST  CORE NEEDLE BIOPSY COMPARISON:  Previous exam(s). PROCEDURE: I met with the patient and we discussed the procedure of ultrasound-guided biopsy, including benefits and alternatives. We discussed the high likelihood of a successful procedure. We discussed the risks of the procedure, including infection, bleeding, tissue injury, clip migration, and inadequate sampling. Informed written consent was given. The usual time-out protocol was performed immediately prior to the procedure. Lesion quadrant: Upper-outer Using sterile technique and 1% Lidocaine as local anesthetic, under direct ultrasound visualization, a 14 gauge spring-loaded device was used to perform biopsy of mass in the left breast at the 2 o'clock position using a lateral to medial approach. At the conclusion of the procedure ribbon shaped tissue marker clip was deployed into the biopsy cavity. Follow up 2 view mammogram was performed and dictated separately. IMPRESSION: Ultrasound guided biopsy of the mass in the left breast at the 2 o'clock position. No apparent complications. Electronically Signed: By: Everlean Alstrom M.D. On: 06/15/2019 15:16    ASSESSMENT & PLAN:  Molly Fuller is a 69 y.o. African American female with a history of Glaucoma, HLD.    1. Malignant neoplasm of upper-outer quadrant of left breast, Stage IA, c(T1bN0M0), ER+, PR/HER2-, Grade II  -We discussed her image findings and the biopsy results in great details. She has a very small left breast mass and biopsy showed invasive lobular carcinoma, ER strongly  positive. -Given the early stage disease, she likely need a lumpectomy with Sentinel LN biopsy. She is agreeable with that. She was seen by Dr. Barry Dienes today and likely will proceed with surgery soon.  -Breast MRI is recommended due to lobular histology, she is agreeable.  -Given the small size of her tumor, lobular histology and low 10% KI67, she will likely not need chemotherapy.  -If her surgical path shows her caner is larger then 1.5cm, I recommend a Oncotype Dx test on the surgical sample and we'll make a decision about adjuvant chemotherapy based on the Oncotype result. Written material of this test was given to her. She is older but in overall good health, would be a good candidate for chemotherapy if her Oncotype recurrence score is high. -The risk of recurrence depends on the stage and biology of the tumor. She is early stage, with ER positive and PR/HER2 negative markers. I discussed this is the more common type of slow growing tumor.  -She was also seen by radiation oncologist Dr. Lisbeth Renshaw today. If her surgical sentinel lymph nodes were negative, she would not need post mastectomy radiation. Otherwise radiation is recommended to reduce the risk for local recurrence.  -Given the strong ER expression in her tumor and her postmenopausal status, I recommend adjuvant endocrine therapy with aromatase inhibitor for a total of 5-10 years to reduce the risk of cancer recurrence. Potential benefits and side effects were discussed with patient and she is interested. -We also discussed the breast cancer surveillance after her surgery. She will continue annual screening mammogram, self exam, and a routine office visit with lab and exam with [place]. -I encouraged her to have healthy diet and exercise regularly -Labs reviewed, CBC and CMP WNL except ANC 1.5. Physical exam benign.  -F/u after surgery and RT    PLAN:  -MRI breast in 1-2 weeks  -Proceed with breat surgery  -oncogype if >1.5cm on surgical path   -F/u after surgery and RT, or sooner if needed    No orders of the defined types were placed in this encounter.   All questions were answered.  The patient knows to call the clinic with any problems, questions or concerns. The total time spent in the appointment was 50 minutes.     Truitt Merle, MD 06/23/2019 4:56 PM  I, Joslyn Devon, am acting as scribe for Truitt Merle, MD.   I have reviewed the above documentation for accuracy and completeness, and I agree with the above.

## 2019-06-22 ENCOUNTER — Other Ambulatory Visit: Payer: Self-pay | Admitting: *Deleted

## 2019-06-22 DIAGNOSIS — C50412 Malignant neoplasm of upper-outer quadrant of left female breast: Secondary | ICD-10-CM | POA: Insufficient documentation

## 2019-06-22 DIAGNOSIS — Z170 Estrogen receptor positive status [ER+]: Secondary | ICD-10-CM

## 2019-06-23 ENCOUNTER — Encounter: Payer: Self-pay | Admitting: Physical Therapy

## 2019-06-23 ENCOUNTER — Ambulatory Visit
Admission: RE | Admit: 2019-06-23 | Discharge: 2019-06-23 | Disposition: A | Discharge status: Home / Self Care | Payer: Medicare Other | Source: Ambulatory Visit | Attending: Radiation Oncology | Admitting: Radiation Oncology

## 2019-06-23 ENCOUNTER — Inpatient Hospital Stay: Payer: Medicare Other

## 2019-06-23 ENCOUNTER — Encounter: Payer: Self-pay | Admitting: *Deleted

## 2019-06-23 ENCOUNTER — Ambulatory Visit: Payer: Medicare Other | Attending: General Surgery | Admitting: Physical Therapy

## 2019-06-23 ENCOUNTER — Encounter: Payer: Self-pay | Admitting: Hematology

## 2019-06-23 ENCOUNTER — Inpatient Hospital Stay: Payer: Medicare Other | Attending: Hematology | Admitting: Hematology

## 2019-06-23 ENCOUNTER — Other Ambulatory Visit: Payer: Self-pay

## 2019-06-23 VITALS — BP 149/73 | HR 51 | Temp 97.8°F | Resp 17 | Ht 63.0 in | Wt 189.6 lb

## 2019-06-23 DIAGNOSIS — H409 Unspecified glaucoma: Secondary | ICD-10-CM | POA: Diagnosis not present

## 2019-06-23 DIAGNOSIS — C50412 Malignant neoplasm of upper-outer quadrant of left female breast: Secondary | ICD-10-CM | POA: Insufficient documentation

## 2019-06-23 DIAGNOSIS — Z170 Estrogen receptor positive status [ER+]: Secondary | ICD-10-CM

## 2019-06-23 DIAGNOSIS — E785 Hyperlipidemia, unspecified: Secondary | ICD-10-CM | POA: Diagnosis not present

## 2019-06-23 DIAGNOSIS — E875 Hyperkalemia: Secondary | ICD-10-CM | POA: Diagnosis not present

## 2019-06-23 DIAGNOSIS — Z800 Family history of malignant neoplasm of digestive organs: Secondary | ICD-10-CM | POA: Diagnosis not present

## 2019-06-23 DIAGNOSIS — R293 Abnormal posture: Secondary | ICD-10-CM | POA: Insufficient documentation

## 2019-06-23 LAB — CMP (CANCER CENTER ONLY)
ALT: 14 U/L (ref 0–44)
AST: 22 U/L (ref 15–41)
Albumin: 3.7 g/dL (ref 3.5–5.0)
Alkaline Phosphatase: 64 U/L (ref 38–126)
Anion gap: 8 (ref 5–15)
BUN: 12 mg/dL (ref 8–23)
CO2: 27 mmol/L (ref 22–32)
Calcium: 9 mg/dL (ref 8.9–10.3)
Chloride: 107 mmol/L (ref 98–111)
Creatinine: 0.77 mg/dL (ref 0.44–1.00)
GFR, Est AFR Am: >60 mL/min (ref >60)
GFR, Estimated: >60 mL/min (ref >60)
Glucose, Bld: 80 mg/dL (ref 70–99)
Potassium: 4 mmol/L (ref 3.5–5.1)
Sodium: 142 mmol/L (ref 135–145)
Total Bilirubin: 1 mg/dL (ref 0.3–1.2)
Total Protein: 7 g/dL (ref 6.5–8.1)

## 2019-06-23 LAB — CBC WITH DIFFERENTIAL (CANCER CENTER ONLY)
Abs Immature Granulocytes: 0 10*3/uL (ref 0.00–0.07)
Basophils Absolute: 0 10*3/uL (ref 0.0–0.1)
Basophils Relative: 0 %
Eosinophils Absolute: 0.1 10*3/uL (ref 0.0–0.5)
Eosinophils Relative: 1 %
HCT: 41 % (ref 36.0–46.0)
Hemoglobin: 13.6 g/dL (ref 12.0–15.0)
Immature Granulocytes: 0 %
Lymphocytes Relative: 53 %
Lymphs Abs: 2.1 10*3/uL (ref 0.7–4.0)
MCH: 29 pg (ref 26.0–34.0)
MCHC: 33.2 g/dL (ref 30.0–36.0)
MCV: 87.4 fL (ref 80.0–100.0)
Monocytes Absolute: 0.3 10*3/uL (ref 0.1–1.0)
Monocytes Relative: 8 %
Neutro Abs: 1.5 10*3/uL — ABNORMAL LOW (ref 1.7–7.7)
Neutrophils Relative %: 38 %
Platelet Count: 174 10*3/uL (ref 150–400)
RBC: 4.69 MIL/uL (ref 3.87–5.11)
RDW: 12.1 % (ref 11.5–15.5)
WBC Count: 4.1 10*3/uL (ref 4.0–10.5)
nRBC: 0 % (ref 0.0–0.2)

## 2019-06-23 LAB — GENETIC SCREENING ORDER

## 2019-06-23 NOTE — Addendum Note (Signed)
Encounter addended by: Hayden Pedro, PA-C on: 06/23/2019 1:56 PM  Actions taken: Level of Service modified

## 2019-06-23 NOTE — Patient Instructions (Signed)

## 2019-06-23 NOTE — Therapy (Signed)
[place], [place], [address] Phone: [phone]   Fax:  [phone]  Physical Therapy Evaluation  Patient Details  Name: Molly Fuller MRN: [id number] Date of Birth: Apr 15, 1950 Referring Provider (PT): Dr. Stark Klein   Encounter Date: 06/23/2019  PT End of Session - 06/23/19 1933    Visit Number  1    Number of Visits  2    Date for PT Re-Evaluation  08/18/19    PT Start Time  1761    PT Stop Time  1456    PT Time Calculation (min)  25 min    Activity Tolerance  Patient tolerated treatment well    Behavior During Therapy  [facility] for tasks assessed/performed       Past Medical History:  Diagnosis Date  . Glaucoma    left eye  . Hyperlipidemia   . Hypertension     Past Surgical History:  Procedure Laterality Date  . no prior surgery      There were no vitals filed for this visit.   Subjective Assessment - 06/23/19 1926    Subjective  Patient reports she is here today to be seen by her medical team for her newly diagnosed left breast cancer.    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 It measures 8 mm and is located in the upper outer quadrant. It is ER positive, PR negative, and HER2 negative with a Ki67 of 10%.    Patient Stated Goals  Reduce lymphedema risk and learn post op shoulder ROM HEP    Currently in Pain?  No/denies         [facility] PT Assessment - 06/23/19 0001      Assessment   Medical Diagnosis  Left breast cancer    Referring Provider (PT)  Dr. Stark Klein    Onset Date/Surgical Date  05/28/19    Hand Dominance  Right    Prior Therapy  none      Precautions   Precautions  Other (comment)    Precaution Comments  active cancer      Restrictions   Weight Bearing Restrictions  No      Balance Screen   Has the patient fallen in the past 6 months   No    Has the patient had a decrease in activity level because of a fear of falling?   No    Is the patient reluctant to leave their home because of a fear of falling?   No      Home Environment   Living Environment  Private residence    Living Arrangements  Spouse/significant other    Available Help at Discharge  Family      Prior Function   Level of [place]  Retired    Leisure  She bikes 2-3x/week for 30 min and walks once a week for 25 min      Cognition   Overall Cognitive Status  Within Functional Limits for tasks assessed      Posture/Postural Control   Posture/Postural Control  Postural limitations    Postural Limitations  Rounded Shoulders;Forward head      ROM / Strength   AROM / PROM / Strength  AROM;Strength      AROM   AROM Assessment Site  Shoulder    Right/Left Shoulder  Right;Left  Right Shoulder Extension  46 Degrees    Right Shoulder Flexion  147 Degrees    Right Shoulder ABduction  168 Degrees    Right Shoulder Internal Rotation  60 Degrees    Right Shoulder External Rotation  78 Degrees    Left Shoulder Extension  50 Degrees    Left Shoulder Flexion  142 Degrees    Left Shoulder ABduction  161 Degrees    Left Shoulder Internal Rotation  60 Degrees    Left Shoulder External Rotation  69 Degrees      Strength   Overall Strength  Within functional limits for tasks performed        LYMPHEDEMA/ONCOLOGY QUESTIONNAIRE - 06/23/19 1930      Type   Cancer Type  Left breast cancer      Lymphedema Assessments   Lymphedema Assessments  Upper extremities      Right Upper Extremity Lymphedema   10 cm Proximal to Olecranon Process  36 cm    Olecranon Process  26.3 cm    10 cm Proximal to Ulnar Styloid Process  23.6 cm    Just Proximal to Ulnar Styloid Process  16.5 cm    Across Hand at [employer]  19.4 cm    At [place] of 2nd Digit  6.3 cm      Left Upper Extremity Lymphedema   10 cm Proximal to Olecranon Process  35.5  cm    Olecranon Process  26.6 cm    10 cm Proximal to Ulnar Styloid Process  22.5 cm    Just Proximal to Ulnar Styloid Process  16.3 cm    Across Hand at [employer]  20.2 cm    At [place] of 2nd Digit  6.4 cm          Quick Dash - 06/23/19 0001    Open a tight or new jar  No difficulty    Do heavy household chores (wash walls, wash floors)  No difficulty    Carry a shopping bag or briefcase  No difficulty    Wash your back  No difficulty    Use a knife to cut food  No difficulty    Recreational activities in which you take some force or impact through your arm, shoulder, or hand (golf, hammering, tennis)  No difficulty    During the past week, to what extent has your arm, shoulder or hand problem interfered with your normal social activities with family, friends, neighbors, or groups?  Not at all    During the past week, to what extent has your arm, shoulder or hand problem limited your work or other regular daily activities  Not at all    Arm, shoulder, or hand pain.  None    Tingling (pins and needles) in your arm, shoulder, or hand  None    Difficulty Sleeping  No difficulty    DASH Score  0 %        Objective measurements completed on examination: See above findings.     Patient was instructed today in a home exercise program today for post op shoulder range of motion. These included active assist shoulder flexion in sitting, scapular retraction, wall walking with shoulder abduction, and hands behind head external rotation.  She was encouraged to do these twice a day, holding 3 seconds and repeating 5 times when permitted by her physician.     PT Education - 06/23/19 1932    Education Details  Lymphedema risk reduction and  post op shoulder ROM HEP    Person(s) Educated  Patient;Spouse    Methods  Explanation;Demonstration;Handout    Comprehension  Returned demonstration;Verbalized understanding          PT Long Term Goals - 06/23/19 1936      PT LONG TERM GOAL  #1   Title  Patient will demonstrate she has regained full shoulder ROM and function post operatively compared to baselines.    Time  8    Period  Weeks    Status  New    Target Date  08/18/19      Breast Clinic Goals - 06/23/19 1936      Patient will be able to verbalize understanding of pertinent lymphedema risk reduction practices relevant to her diagnosis specifically related to skin care.   Time  1    Period  Days    Status  Achieved      Patient will be able to return demonstrate and/or verbalize understanding of the post-op home exercise program related to regaining shoulder range of motion.   Time  1    Period  Days    Status  Achieved      Patient will be able to verbalize understanding of the importance of attending the postoperative After Breast Cancer Class for further lymphedema risk reduction education and therapeutic exercise.   Time  1    Period  Days    Status  Achieved            Plan - 06/23/19 1933    Clinical Impression Statement  Patient was diagnosed on 05/28/2019 with left grade II invasive lobular carcinoma breast cancer. It measures 8 mm and is located in the upper outer quadrant. It is ER positive, PR negative, and HER2 negative with a Ki67 of 10%. Her multidisciplinary medical team met prior to her assessments to determine a recommended treatment plan. She is planning to have a left lumpectomy and sentinel node biopsy followed by possible Oncotype testing, radiation, and anti-estrogen therapy. She will benefit from a post op PT reassessment to determine needs and L-Dex screenings every 3 months for 2 years to assess for subclinical lymphedema.    Stability/Clinical Decision Making  Stable/Uncomplicated    Clinical Decision Making  Low    Rehab Potential  Excellent    PT Frequency  --   Eval and 1 f/u visit   PT Treatment/Interventions  ADLs/Self Care Home Management;Therapeutic exercise;Patient/family education    PT Next Visit Plan  Will reassess 3-4  weeks post op to determine needs    PT Home Exercise Plan  Post op shoulder ROM HEP    Consulted and Agree with Plan of Care  Patient;Family member/caregiver    Family Member Consulted  Husband       Patient will benefit from skilled therapeutic intervention in order to improve the following deficits and impairments:  Decreased knowledge of precautions, Impaired UE functional use, Pain, Postural dysfunction, Decreased range of motion  Visit Diagnosis: Malignant neoplasm of upper-outer quadrant of left breast in female, estrogen receptor positive ([place]) - Plan: PT plan of care cert/re-cert  Abnormal posture - Plan: PT plan of care cert/re-cert   The patient was assessed using the L-Dex machine today to produce a lymphedema index baseline score. The patient will be reassessed on a regular basis (typically every 3 months) to obtain new L-Dex scores. If the score is > 6.5 points away from his/her baseline score indicating onset of subclinical lymphedema, it will be recommended  to wear a compression garment for 4 weeks, 12 hours per day and then be reassessed. If the score continues to be > 6.5 points from baseline at reassessment, we will initiate lymphedema treatment. Assessing in this manner has a 95% rate of preventing clinically significant lymphedema.  Patient will follow up at outpatient cancer rehab 3-4 weeks following surgery.  If the patient requires physical therapy at that time, a specific plan will be dictated and sent to the referring physician for approval. The patient was educated today on appropriate basic range of motion exercises to begin post operatively and the importance of attending the After Breast Cancer class following surgery.  Patient was educated today on lymphedema risk reduction practices as it pertains to recommendations that will benefit the patient immediately following surgery.  She verbalized good understanding.      Problem List Patient Active Problem List    Diagnosis Date Noted  . Malignant neoplasm of upper-outer quadrant of left breast in female, estrogen receptor positive ([place]) 06/22/2019  . Constipation 12/30/2017  . Hyperlipidemia type III 12/30/2017  . Class 1 obesity with serious comorbidity and body mass index (BMI) of 30.0 to 30.9 in adult 01/09/2015  . Routine general medical examination at a health care facility 09/05/2011  . Glaucoma 09/05/2011  . Hyperlipidemia 12/15/2006   Annia Friendly, PT 06/23/19 7:39 PM  [place], [place], [address] Phone: [phone]   Fax:  [phone]  Name: Molly Fuller MRN: [id number] Date of Birth: Nov 30, 1950

## 2019-06-23 NOTE — Progress Notes (Signed)
Radiation Oncology         [phone] ________________________________  Name: Molly Fuller        MRN: [id number]  Date of Service: 06/23/2019 DOB: 17-Jan-1951  ZG:YFVCBS, [name], MD  Stark Klein, MD     REFERRING PHYSICIAN: Stark Klein, MD   DIAGNOSIS: The encounter diagnosis was Malignant neoplasm of upper-outer quadrant of left breast in female, estrogen receptor positive ([place]).   HISTORY OF PRESENT ILLNESS: Molly Fuller is a 69 y.o. female seen in the multidisciplinary breast clinic for a new diagnosis of left breast cancer. The patient was noted to have a screening detected left breast mass.  Diagnostic imaging revealed a mass at 2:00 measuring 8 x 8 x 7 mm in greatest dimension.  Her left axilla was negative for adenopathy.  A biopsy on 06/15/2019 was performed of the breast at 2:00, and revealed a grade 2 invasive lobular carcinoma.  Her markers were ER positive, PR negative, HER-2 negative with a Ki-67 of 10%.  She is seen today to discuss treatment recommendations for her cancer.   PREVIOUS RADIATION THERAPY: No   PAST MEDICAL HISTORY:  Past Medical History:  Diagnosis Date  . Glaucoma    left eye  . Hyperlipidemia   . Hypertension        PAST SURGICAL HISTORY: Past Surgical History:  Procedure Laterality Date  . no prior surgery       FAMILY HISTORY:  Family History  Problem Relation Age of Onset  . Colon cancer Paternal Grandmother 27  . Hypertension Other   . Diabetes Other   . Breast cancer Neg Hx      SOCIAL HISTORY:  reports that she has never smoked. She has never used smokeless tobacco. She reports that she does not drink alcohol or use drugs. The patient is married and lives in [place]. She is accompanied by her husband. She is very active and enjoys working in the garden.   ALLERGIES: Bee venom   MEDICATIONS:  Current Outpatient Medications  Medication Sig Dispense Refill  . atorvastatin (LIPITOR) 20 MG tablet Take 1 tablet  (20 mg total) by mouth daily. 90 tablet 3  . bimatoprost (LUMIGAN) 0.03 % ophthalmic solution Place 1 drop into both eyes at bedtime.    . brimonidine-timolol (COMBIGAN) 0.2-0.5 % ophthalmic solution Place 1 drop into both eyes every 12 (twelve) hours.    . dorzolamide (TRUSOPT) 2 % ophthalmic solution INSTILL 1 DROP INTO BOTH EYES TWICE A DAY  11  . EPINEPHrine 0.3 mg/0.3 mL IJ SOAJ injection     . latanoprost (XALATAN) 0.005 % ophthalmic solution PLACE 1 DROP INTO BOTH EYES EVERY EVENING     No current facility-administered medications for this encounter.     REVIEW OF SYSTEMS: On review of systems, the patient reports that she is doing well overall. She denies any chest pain, shortness of breath, cough, fevers, chills, night sweats, unintended weight changes. She denies any bowel or bladder disturbances, and denies abdominal pain, nausea or vomiting. She denies any new musculoskeletal or joint aches or pains. A complete review of systems is obtained and is otherwise negative.     PHYSICAL EXAM:  Wt Readings from Last 3 Encounters:  06/23/19 189 lb 9.6 oz (86 kg)  08/17/18 169 lb (76.7 kg)  04/14/18 174 lb 6 oz (79.1 kg)   Temp Readings from Last 3 Encounters:  06/23/19 97.8 F (36.6 C) (Temporal)  08/17/18 98.5 F (36.9 C) (Oral)  04/14/18 97.9  F (36.6 C) (Oral)   BP Readings from Last 3 Encounters:  06/23/19 (!) 149/73  08/17/18 120/78  04/14/18 122/78   Pulse Readings from Last 3 Encounters:  06/23/19 (!) 51  08/17/18 65  04/14/18 60    In general this is a well appearing African American female in no acute distress. She's alert and oriented x4 and appropriate throughout the examination. Cardiopulmonary assessment is negative for acute distress and she exhibits normal effort. Bilateral breast exam is deferred.    ECOG = 0  0 - Asymptomatic (Fully active, able to carry on all predisease activities without restriction)  1 - Symptomatic but completely ambulatory  (Restricted in physically strenuous activity but ambulatory and able to carry out work of a light or sedentary nature. For example, light housework, office work)  2 - Symptomatic, <50% in bed during the day (Ambulatory and capable of all self care but unable to carry out any work activities. Up and about more than 50% of waking hours)  3 - Symptomatic, >50% in bed, but not bedbound (Capable of only limited self-care, confined to bed or chair 50% or more of waking hours)  4 - Bedbound (Completely disabled. Cannot carry on any self-care. Totally confined to bed or chair)  5 - Death   Eustace Pen MM, Creech RH, Tormey DC, et al. [phone]). "Toxicity and response criteria of the [facility]". [place] Oncol. 5 (6): 649-55    LABORATORY DATA:  Lab Results  Component Value Date   WBC 4.1 06/23/2019   HGB 13.6 06/23/2019   HCT 41.0 06/23/2019   MCV 87.4 06/23/2019   PLT 174 06/23/2019   Lab Results  Component Value Date   NA 142 06/23/2019   K 4.0 06/23/2019   CL 107 06/23/2019   CO2 27 06/23/2019   Lab Results  Component Value Date   ALT 14 06/23/2019   AST 22 06/23/2019   ALKPHOS 64 06/23/2019   BILITOT 1.0 06/23/2019      RADIOGRAPHY: US BREAST LTD UNI LEFT INC AXILLA  Result Date: 06/14/2019 CLINICAL DATA:  Patient returns after screening study for evaluation of possible LEFT breast mass. EXAM: DIGITAL DIAGNOSTIC LEFT MAMMOGRAM WITH CAD AND TOMO ULTRASOUND LEFT BREAST COMPARISON:  05/28/2019 and earlier ACR Breast Density Category b: There are scattered areas of fibroglandular density. FINDINGS: Additional 2-D and 3-D images are performed. These views show a spiculated mass in the UPPER-OUTER QUADRANT of the LEFT breast. Mammographic images were processed with CAD. Targeted ultrasound is performed, showing an oval mass with indistinct margins in the 2 o'clock location of the LEFT breast 5 centimeters from the nipple. Mass is taller than wide and measures 0.8 x  0.7 x 0.8 centimeters. Internal blood flow is present on Doppler evaluation. Evaluation of the LEFT axilla is negative for adenopathy. IMPRESSION: Suspicious mass in the 2 o'clock location of the LEFT breast. RECOMMENDATION: Ultrasound-guided core biopsy of LEFT breast mass. I have discussed the findings and recommendations with the patient. If applicable, a reminder letter will be sent to the patient regarding the next appointment. BI-RADS CATEGORY  4: Suspicious. Electronically Signed   By: Nolon Nations M.D.   On: 06/14/2019 09:50   MM DIAG BREAST TOMO UNI LEFT  Result Date: 06/14/2019 CLINICAL DATA:  Patient returns after screening study for evaluation of possible LEFT breast mass. EXAM: DIGITAL DIAGNOSTIC LEFT MAMMOGRAM WITH CAD AND TOMO ULTRASOUND LEFT BREAST COMPARISON:  05/28/2019 and earlier ACR Breast Density Category b: There are  scattered areas of fibroglandular density. FINDINGS: Additional 2-D and 3-D images are performed. These views show a spiculated mass in the UPPER-OUTER QUADRANT of the LEFT breast. Mammographic images were processed with CAD. Targeted ultrasound is performed, showing an oval mass with indistinct margins in the 2 o'clock location of the LEFT breast 5 centimeters from the nipple. Mass is taller than wide and measures 0.8 x 0.7 x 0.8 centimeters. Internal blood flow is present on Doppler evaluation. Evaluation of the LEFT axilla is negative for adenopathy. IMPRESSION: Suspicious mass in the 2 o'clock location of the LEFT breast. RECOMMENDATION: Ultrasound-guided core biopsy of LEFT breast mass. I have discussed the findings and recommendations with the patient. If applicable, a reminder letter will be sent to the patient regarding the next appointment. BI-RADS CATEGORY  4: Suspicious. Electronically Signed   By: Nolon Nations M.D.   On: 06/14/2019 09:50   MM 3D SCREEN BREAST BILATERAL  Result Date: 05/28/2019 CLINICAL DATA:  Screening. EXAM: DIGITAL SCREENING BILATERAL  MAMMOGRAM WITH TOMO AND CAD COMPARISON:  Prior studies ACR Breast Density Category b: There are scattered areas of fibroglandular density. FINDINGS: In the left breast, a possible mass warrants further evaluation. In the right breast, no findings suspicious for malignancy. Images were processed with CAD. IMPRESSION: Further evaluation is suggested for possible mass in the left breast. RECOMMENDATION: Diagnostic mammogram and possibly ultrasound of the left breast. (Code:[id number]) The patient will be contacted regarding the findings, and additional imaging will be scheduled. BI-RADS CATEGORY  0: Incomplete. Need additional imaging evaluation and/or prior mammograms for comparison. Electronically Signed   By: Margarette Canada M.D.   On: 05/28/2019 12:08   MM CLIP PLACEMENT LEFT  Result Date: 06/15/2019 CLINICAL DATA:  Post ultrasound-guided biopsy of a mass in the left breast at the 2 o'clock position. EXAM: DIAGNOSTIC LEFT MAMMOGRAM POST ULTRASOUND BIOPSY COMPARISON:  Previous exams. FINDINGS: Mammographic images were obtained following ultrasound guided biopsy of a mass in the left breast at the 2 o'clock position. A ribbon shaped biopsy marking clip is present at the site of the biopsied mass in the left breast at the 2 o'clock position. IMPRESSION: Ribbon shaped biopsy marking clip at site of biopsied mass in the left breast at the 2 o'clock position. Final Assessment: Post Procedure Mammograms for Marker Placement Electronically Signed   By: Everlean Alstrom M.D.   On: 06/15/2019 15:26   [place] LT BREAST BX W LOC DEV 1ST LESION IMG BX SPEC US GUIDE  Addendum Date: 06/16/2019   ADDENDUM REPORT: 06/16/2019 13:36 ADDENDUM: Pathology revealed GRADE II INVASIVE MAMMARY CARCINOMA of the LEFT breast, 2 o'clock. This was found to be concordant by Dr. Everlean Alstrom. Pathology results were discussed with the patient by telephone. The patient reported doing well after the biopsy with tenderness at the site. Post biopsy  instructions and care were reviewed and questions were answered. The patient was encouraged to call [facility] for any additional concerns. The patient was referred to [facility] on Jun 23, 2019. Pathology results reported by Stacie Acres RN on 06/16/2019. Electronically Signed   By: Everlean Alstrom M.D.   On: 06/16/2019 13:36   Result Date: 06/16/2019 CLINICAL DATA:  69 year old female with a suspicious mass in the left breast at the 2 o'clock position. EXAM: ULTRASOUND GUIDED LEFT BREAST CORE NEEDLE BIOPSY COMPARISON:  Previous exam(s). PROCEDURE: I met with the patient and we discussed the procedure of  ultrasound-guided biopsy, including benefits and alternatives. We discussed the high likelihood of a successful procedure. We discussed the risks of the procedure, including infection, bleeding, tissue injury, clip migration, and inadequate sampling. Informed written consent was given. The usual time-out protocol was performed immediately prior to the procedure. Lesion quadrant: Upper-outer Using sterile technique and 1% Lidocaine as local anesthetic, under direct ultrasound visualization, a 14 gauge spring-loaded device was used to perform biopsy of mass in the left breast at the 2 o'clock position using a lateral to medial approach. At the conclusion of the procedure ribbon shaped tissue marker clip was deployed into the biopsy cavity. Follow up 2 view mammogram was performed and dictated separately. IMPRESSION: Ultrasound guided biopsy of the mass in the left breast at the 2 o'clock position. No apparent complications. Electronically Signed: By: Everlean Alstrom M.D. On: 06/15/2019 15:16       IMPRESSION/PLAN: 1. Stage IA, [id number] grade 2, ER positive invasive lobular carcinoma of the left breast. Dr. Lisbeth Renshaw discusses the pathology findings and reviews the nature of left breast disease. The consensus  from the breast conference includes MRI for extent of disease, followed by breast conservation with lumpectomy with sentinel node biopsy. Dr. Burr Medico does not anticipate chemotherapy, so the patient's course would then be followed by external radiotherapy to the breast followed by antiestrogen therapy. We discussed the risks, benefits, short, and long term effects of radiotherapy, and the patient is interested in proceeding. Dr. Lisbeth Renshaw discusses the delivery and logistics of radiotherapy and anticipates a course of 4-6 1/2 weeks of radiotherapy to the left breast with deep inspiration breath hold technique. We will see her back about 2 weeks after surgery to discuss the simulation process and anticipate we starting radiotherapy about 4-6 weeks after surgery.    In a visit lasting 60 minutes, greater than 50% of the time was spent face to face reviewing her case, as well as in preparation of, discussing, and coordinating the patient's care.  The above documentation reflects my direct findings during this shared patient visit. Please see the separate note by Dr. Lisbeth Renshaw on this date for the remainder of the patient's plan of care.    Carola Rhine, PAC

## 2019-06-24 ENCOUNTER — Encounter: Payer: Self-pay | Admitting: General Practice

## 2019-06-24 NOTE — Progress Notes (Signed)
[facility] Psychosocial Distress Screening Spiritual Care  Met with Danyla and her husband Berneta Sages in [facility] to introduce [facility] team/resources, reviewing distress screen per protocol.  The patient scored a 1 on the Psychosocial Distress Thermometer which indicates mild distress. Also assessed for distress and other psychosocial needs.   ONCBCN DISTRESS SCREENING 06/24/2019  Screening Type Initial Screening  Distress experienced in past week (1-10) 1  Referral to support programs Yes   Ms Bai reports low distress and joyful relief at learning the scope of her diagnosis and treatment plan. She adds that [facility] was a positive and useful experience.   Follow up needed: No. Per couple, no other needs or concerns at this time. They are aware of ongoing [facility]) team/programming availability.   [place], [place], [facility] Pager [phone] Voicemail [phone]

## 2019-06-30 ENCOUNTER — Telehealth: Payer: Self-pay

## 2019-06-30 NOTE — Telephone Encounter (Signed)
Nutrition Assessment  Reason for Assessment:  Pt attended Breast Clinic on 06/23/19 and was given nutrition packet by nurse navigator  ASSESSMENT:  69 year old female with new diagnosis of breast cancer.  Planning surgery, possible oncotype, possible radiation and antiestrogens.    Spoke with patient to introduce self and service at [facility].  Patient reports that she has a good appetite.    Medications:  reviewed  Labs: reviewed  Anthropometrics:   Height: 63 inches Weight: 189 lb  BMI: 33   NUTRITION DIAGNOSIS: Food and nutrition related knowledge deficit related to new diagnosis of breast cancer as evidenced by no prior need for nutrition related information.  INTERVENTION:   Discussed briefly packet of information regarding nutritional tips for breast cancer patients.  Questions answered.  Teachback method used.  Contact information provided and patient knows to contact me with questions/concerns.    MONITORING, EVALUATION, and GOAL: Pt will consume a healthy plant based diet to maintain lean body mass throughout treatment.   Molly Fuller B. Molly Fuller, [place], [place] Registered Dietitian [phone] (pager)

## 2019-07-01 ENCOUNTER — Encounter: Payer: Self-pay | Admitting: *Deleted

## 2019-07-01 ENCOUNTER — Telehealth: Payer: Self-pay | Admitting: *Deleted

## 2019-07-01 NOTE — Telephone Encounter (Signed)
Left vm regarding [facility] from 5.5.21. Contact information provided for questions or needs.

## 2019-07-05 ENCOUNTER — Other Ambulatory Visit: Payer: Self-pay

## 2019-07-05 ENCOUNTER — Ambulatory Visit (HOSPITAL_COMMUNITY)
Admission: RE | Admit: 2019-07-05 | Discharge: 2019-07-05 | Disposition: A | Discharge status: Home / Self Care | Payer: Medicare Other | Source: Ambulatory Visit | Attending: General Surgery | Admitting: General Surgery

## 2019-07-05 DIAGNOSIS — C50412 Malignant neoplasm of upper-outer quadrant of left female breast: Secondary | ICD-10-CM | POA: Insufficient documentation

## 2019-07-05 DIAGNOSIS — Z170 Estrogen receptor positive status [ER+]: Secondary | ICD-10-CM | POA: Insufficient documentation

## 2019-07-05 DIAGNOSIS — C50912 Malignant neoplasm of unspecified site of left female breast: Secondary | ICD-10-CM | POA: Diagnosis not present

## 2019-07-05 MED ORDER — GADOBUTROL 1 MMOL/ML IV SOLN
9.0000 mL | Freq: Once | INTRAVENOUS | Status: AC | PRN
  Administered 2019-07-05: 9 mL via INTRAVENOUS

## 2019-07-06 ENCOUNTER — Other Ambulatory Visit: Payer: Self-pay | Admitting: General Surgery

## 2019-07-06 DIAGNOSIS — Z170 Estrogen receptor positive status [ER+]: Secondary | ICD-10-CM

## 2019-07-07 ENCOUNTER — Encounter: Payer: Self-pay | Admitting: *Deleted

## 2019-07-08 ENCOUNTER — Other Ambulatory Visit: Payer: Self-pay | Admitting: General Surgery

## 2019-07-08 DIAGNOSIS — Z170 Estrogen receptor positive status [ER+]: Secondary | ICD-10-CM

## 2019-07-08 DIAGNOSIS — C50412 Malignant neoplasm of upper-outer quadrant of left female breast: Secondary | ICD-10-CM

## 2019-07-14 ENCOUNTER — Encounter: Payer: Self-pay | Admitting: *Deleted

## 2019-07-21 NOTE — Pre-Procedure Instructions (Signed)
Molly Fuller  07/21/2019      [id number] - [place], [place] - [place]. [place] [address] Phone: [phone] Fax: [phone]  [id number] - [place], [place]. [address] [place] [address] Phone: [phone] Fax: [phone]    Your procedure is scheduled on June 11  Report to [facility] Entrance A at 10:00 A.M.  Call this number if you have problems the morning of surgery:  [phone]   Remember:  Do not eat  after midnight the night before your surgery.  You may drink clear liquids until 9:00 A.M. .  Clear liquids allowed are:                    Water, Juice (non-citric and without pulp - diabetics please choose diet or no sugar options), Carbonated beverages - (diabetics please choose diet or no sugar options), Clear Tea, Black Coffee only (no creamer, milk or cream including half and half), Plain Jell-O only (diabetics please choose diet or no sugar options), Gatorade (diabetics please choose diet or no sugar options) and Plain Popsicles only                    Please complete your PRE-SURGERY ENSURE that was provided to you by 9:00 A.M. the morning of surgery.  Please, if able, drink it in one setting. DO NOT SIP.    Take these medicines the morning of surgery with A SIP OF WATER :              Atorvastatin (lipitor)             Eye drops               7 days prior to surgery STOP taking any Aspirin (unless otherwise instructed by your surgeon), Aleve, Naproxen, Ibuprofen, Motrin, Advil, Goody's, BC's, all herbal medications, fish oil, and all vitamins.    Do not wear jewelry, make-up or nail polish.  Do not wear lotions, powders, or perfumes, or deodorant.  Do not shave 48 hours prior to surgery.  Men may shave face and neck.  Do not bring valuables to the hospital.  [facility] is not responsible for any belongings or  valuables.  Contacts, dentures or bridgework may not be worn into surgery.  Leave your suitcase in the car.  After surgery it may be brought to your room.  For patients admitted to the hospital, discharge time will be determined by your treatment team.  Patients discharged the day of surgery will not be allowed to drive home.    Special instructions:   [facility]- Preparing For Surgery  Before surgery, you can play an important role. Because skin is not sterile, your skin needs to be as free of germs as possible. You can reduce the number of germs on your skin by washing with CHG (chlorahexidine gluconate) Soap before surgery.  CHG is an antiseptic cleaner which kills germs and bonds with the skin to continue killing germs even after washing.    Oral Hygiene is also important to reduce your risk of infection.  Remember - BRUSH YOUR TEETH THE MORNING OF SURGERY WITH YOUR REGULAR TOOTHPASTE  Please do not use if you have an allergy to CHG or antibacterial soaps. If your skin becomes reddened/irritated stop using the CHG.  Do not shave (including legs and underarms) for at least 48 hours  prior to first CHG shower. It is OK to shave your face.  Please follow these instructions carefully.   1. Shower the NIGHT BEFORE SURGERY and the MORNING OF SURGERY with CHG.   2. If you chose to wash your hair, wash your hair first as usual with your normal shampoo.  3. After you shampoo, rinse your hair and body thoroughly to remove the shampoo.  4. Use CHG as you would any other liquid soap. You can apply CHG directly to the skin and wash gently with a scrungie or a clean washcloth.   5. Apply the CHG Soap to your body ONLY FROM THE NECK DOWN.  Do not use on open wounds or open sores. Avoid contact with your eyes, ears, mouth and genitals (private parts). Wash Face and genitals (private parts)  with your normal soap.  6. Wash thoroughly, paying special attention to the area where your surgery will be  performed.  7. Thoroughly rinse your body with warm water from the neck down.  8. DO NOT shower/wash with your normal soap after using and rinsing off the CHG Soap.  9. Pat yourself dry with a CLEAN TOWEL.  10. Wear CLEAN PAJAMAS to bed the night before surgery, wear comfortable clothes the morning of surgery  11. Place CLEAN SHEETS on your bed the night of your first shower and DO NOT SLEEP WITH PETS.    Day of Surgery:  Do not apply any deodorants/lotions.  Please wear clean clothes to the hospital/surgery center.   Remember to brush your teeth WITH YOUR REGULAR TOOTHPASTE.    Please read over the following fact sheets that you were given. Coughing and Deep Breathing and Surgical Site Infection Prevention

## 2019-07-22 ENCOUNTER — Encounter (HOSPITAL_COMMUNITY): Payer: Self-pay

## 2019-07-22 ENCOUNTER — Other Ambulatory Visit: Payer: Self-pay

## 2019-07-22 ENCOUNTER — Encounter (HOSPITAL_COMMUNITY)
Admission: RE | Admit: 2019-07-22 | Discharge: 2019-07-22 | Disposition: A | Discharge status: Home / Self Care | Payer: Medicare Other | Source: Ambulatory Visit | Attending: General Surgery | Admitting: General Surgery

## 2019-07-22 DIAGNOSIS — Z01818 Encounter for other preprocedural examination: Secondary | ICD-10-CM | POA: Diagnosis not present

## 2019-07-22 LAB — CBC
HCT: 45 % (ref 36.0–46.0)
Hemoglobin: 14.4 g/dL (ref 12.0–15.0)
MCH: 28.6 pg (ref 26.0–34.0)
MCHC: 32 g/dL (ref 30.0–36.0)
MCV: 89.5 fL (ref 80.0–100.0)
Platelets: 216 10*3/uL (ref 150–400)
RBC: 5.03 MIL/uL (ref 3.87–5.11)
RDW: 12.1 % (ref 11.5–15.5)
WBC: 4.3 10*3/uL (ref 4.0–10.5)
nRBC: 0 % (ref 0.0–0.2)

## 2019-07-22 LAB — BASIC METABOLIC PANEL
Anion gap: 10 (ref 5–15)
BUN: 11 mg/dL (ref 8–23)
CO2: 24 mmol/L (ref 22–32)
Calcium: 9.1 mg/dL (ref 8.9–10.3)
Chloride: 105 mmol/L (ref 98–111)
Creatinine, Ser: 0.71 mg/dL (ref 0.44–1.00)
GFR calc Af Amer: >60 mL/min (ref >60)
GFR calc non Af Amer: >60 mL/min (ref >60)
Glucose, Bld: 84 mg/dL (ref 70–99)
Potassium: 4.1 mmol/L (ref 3.5–5.1)
Sodium: 139 mmol/L (ref 135–145)

## 2019-07-22 NOTE — Progress Notes (Signed)
PCP - B [place]  WITH [place] Cardiologist -NA    -   Chest x-ray - NA EKG - 6/21 Stress Test - NA ECHO - 9/20 Cardiac Cath - NA     - Aspirin Instructions:  STOP  ERAS Protcol -YES PRE-SURGERY Ensure or G2- INSTRUCTED ON DRINK  COVID TEST- FOR 8TH   Anesthesia review: VIEW  EKG  Patient denies shortness of breath, fever, cough and chest pain at PAT appointment   All instructions explained to the patient, with a verbal understanding of the material. Patient agrees to go over the instructions while at home for a better understanding. Patient also instructed to self quarantine after being tested for COVID-19. The opportunity to ask questions was provided.

## 2019-07-23 NOTE — Progress Notes (Signed)
Anesthesia Chart Review:  Case: [id number] Date/Time: 07/30/19 1145   Procedure: LEFT BREAST LUMPECTOMY WITH RADIOACTIVE SEED AND SENTINEL LYMPH NODE BIOPSY (Left Breast) - REGIONAL FOR POST OP PAIN   Anesthesia type: General   Pre-op diagnosis: LEFT BREAST CANCER   Location: [facility] OR ROOM 08 / [facility] OR   Surgeons: Stark Klein, MD    Special Needs: Supine, RNFA, neoprobe BCG 6/10 @ 1:45 PM, NUC MED 6/11 @ 11:30 AM, ERAS   DISCUSSION: Patient is a 69 year old female scheduled for the above procedure.   History includes never smoker, HTN (no meds), HLD, glaucoma (left eye), breast cancer (left breast biopsy 06/15/19: invasive mammary carcinoma). BMI is consistent with obesity.   She no longer required medication for HTN. BP 146/77. Took Phentermine in the past for weight loss, but no longer on as of 08/17/18 PCP visit. Was on a walking program at that time. 10/2018 echo showed normal LVEF and wall motion. 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 She denied SOB and chest pain at PAT RN visit.   Appears she received 2nd Bandera vaccine on 03/26/19 ([place]).  Presurgical COVID-19 test is scheduled for 07/27/2019. Anesthesia team to evaluate on the day of surgery.   VS: BP (!) 146/77   Pulse (!) 52   Temp 36.7 C (Oral)   Resp 20   Ht 5\' 3"  (1.6 m)   Wt 85 kg   LMP 02/19/2000   SpO2 100%   BMI 33.18 kg/m   PROVIDERS: [place], Betty G, MD is PCP  Truitt Merle, MD is HEM-ONC Kyung Rudd, MD is RAD-ONC   LABS: Labs reviewed: Acceptable for surgery. (all labs ordered are listed, but only abnormal results are displayed)  Labs Reviewed  CBC  BASIC METABOLIC PANEL     IMAGES: MRI Breast 07/05/19: IMPRESSION: 1. Recently diagnosed left breast malignancy in the upper outer left breast measures 11 x 8 x 9 mm on MRI. 2. No other evidence of left breast malignancy. No evidence of right breast malignancy. No  enlarged or abnormal lymph nodes. RECOMMENDATION: 1. Treatment as planned for the known left breast malignancy. BI-RADS CATEGORY  6: Known biopsy-proven malignancy.   EKG: 07/22/19: Sinus bradycardia at 52 bpm  Inferior infarct , age undetermined Abnormal ECG - No significant change when compared to prior tracing. 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    CV: Echo 10/22/18: IMPRESSIONS  1. The left ventricle has hyperdynamic systolic function, with an  ejection fraction of >65%. The cavity size was normal. Left ventricular  diastolic Doppler parameters are consistent with impaired relaxation.  Indeterminate filling pressures The E/e' is  8-15. No evidence of left ventricular regional wall motion abnormalities.  2. The right ventricle has normal systolic function. The cavity was  normal. There is no increase in right ventricular wall thickness.  3. The mitral valve is grossly normal.  4. The tricuspid valve is grossly normal.  5. The aortic valve is tricuspid. Mild sclerosis of the aortic valve. No  stenosis of the aortic valve.  6. The aorta is normal unless otherwise noted.  SUMMARY  LVEF 65-70%, normal wall motion, normal wall thickness, grade 1 DD,  indeterminate LV filling pressure, normal RV function, normal  biatrial size, aortic valve sclerosis, trivial TR, RVSP 18 mmHg,  normal IVC, no pericardial effusion    Past Medical History:  Diagnosis Date  .  Glaucoma    left eye  . Hyperlipidemia   . Hypertension    NO MEDS    Past Surgical History:  Procedure Laterality Date  . no prior surgery    . TUBAL LIGATION      MEDICATIONS: . atorvastatin (LIPITOR) 20 MG tablet  . brimonidine-timolol (COMBIGAN) 0.2-0.5 % ophthalmic solution  . dorzolamide (TRUSOPT) 2 % ophthalmic solution  . EPINEPHrine 0.3 mg/0.3 mL IJ SOAJ injection  . latanoprost (XALATAN) 0.005 % ophthalmic solution   No current facility-administered medications  for this encounter.     Myra Gianotti, PA-C Surgical Short Stay/Anesthesiology [facility] Phone [phone] [facility] Phone [phone] 07/23/2019 5:09 PM

## 2019-07-23 NOTE — Anesthesia Preprocedure Evaluation (Addendum)
Anesthesia Evaluation  Patient identified by MRN, date of birth, ID band Patient awake    Reviewed: Allergy & Precautions, NPO status , Patient's Chart, lab work & pertinent test results  Airway Mallampati: II  TM Distance: >3 FB Neck ROM: Full    Dental no notable dental hx. (+) Teeth Intact, Dental Advisory Given   Pulmonary neg pulmonary ROS,    Pulmonary exam normal breath sounds clear to auscultation       Cardiovascular Exercise Tolerance: Good hypertension, negative cardio ROS Normal cardiovascular exam Rhythm:Regular Rate:Normal  10/22/18 Echo LVEF 65-70%, normal wall motion, normal wall thickness, grade 1 DD,  indeterminate LV filling pressure, normal RV function, normal  biatrial size, aortic valve sclerosis, trivial TR, RVSP 18 mmHg,  normal IVC, no pericardial effusion   Neuro/Psych negative neurological ROS  negative psych ROS   GI/Hepatic negative GI ROS, Neg liver ROS,   Endo/Other  negative endocrine ROS  Renal/GU negative Renal ROS     Musculoskeletal negative musculoskeletal ROS (+)   Abdominal   Peds  Hematology Lab Results      Component                Value               Date                      WBC                      4.3                 07/22/2019                HGB                      14.4                07/22/2019                HCT                      45.0                07/22/2019                MCV                      89.5                07/22/2019                PLT                      216                 07/22/2019              Anesthesia Other Findings All Bee venom  Reproductive/Obstetrics                            Anesthesia Physical Anesthesia Plan  ASA: II  Anesthesia Plan: General   Post-op Pain Management:    Induction: Intravenous  PONV Risk Score and Plan: 4 or greater and Treatment may vary due to age or medical condition, Ondansetron  and Dexamethasone  Airway Management Planned: LMA  Additional Equipment: None  Intra-op Plan:   Post-operative Plan:   Informed Consent: I have reviewed the patients History and Physical, chart, labs and discussed the procedure including the risks, benefits and alternatives for the proposed anesthesia with the patient or authorized representative who has indicated his/her understanding and acceptance.     Dental advisory given  Plan Discussed with: CRNA  Anesthesia Plan Comments: (PAT note written 07/23/2019 by Myra Gianotti, PA-C.  GA w L Pec block w exparel )      Anesthesia Quick Evaluation

## 2019-07-26 NOTE — H&P (Signed)
Molly Fuller Location: [facility] Patient #: [id number] DOB: 07/05/1950 Undefined / Language: Cleophus Molt / Race: Black or African American Female   History of Present Illness The patient is a 69 year old female who presents with breast cancer. Pt is a 69 yo F who presents with screening detected left breast cancer. She was seen to have a mass on her screening mammogram. Diagnostic imaging was performed. This showed an 8 mm mass at 2 o'clock in the left breast. Core needle biopsy was performed. It showed grade 2 invasive lobular carcinoma, +/-/-, Ki 67 10%. She has no know family history of cancer and no personal history of cancer. She is a G2P2 with first child at age 36. She had menarche at age 51. She last had a colonoscopy and bone density in 2012.    Mammograms are from [facility] and described above.   path described above.  CMET and CBC 06/23/2019 are essentially normal. ANC is slightly low.     Past Surgical History Breast Biopsy  Left.  Diagnostic Studies History Colonoscopy  5-10 years ago Mammogram  within last year Pap Smear  1-5 years ago  Medication History Medications Reconciled  Social History Caffeine use  Coffee. No alcohol use  No drug use  Tobacco use  Never smoker.  Family History  Diabetes Mellitus  Mother. Heart Disease  Daughter, Father. Hypertension  Mother.  Pregnancy / Birth History Age at menarche  66 years. Age of menopause  44-50 Contraceptive History  Oral contraceptives. Gravida  2 Maternal age  26-30 Para  2 Regular periods   Other Problems Breast Cancer  Other disease, cancer, significant illness     Review of Systems  General Not Present- Appetite Loss, Chills, Fatigue, Fever, Night Sweats, Weight Gain and Weight Loss. Skin Not Present- Change in Wart/Mole, Dryness, Hives, Jaundice, New Lesions, Non-Healing Wounds, Rash and Ulcer. HEENT Present- Wears glasses/contact lenses. Not Present-  Earache, Hearing Loss, Hoarseness, Nose Bleed, Oral Ulcers, Ringing in the Ears, Seasonal Allergies, Sinus Pain, Sore Throat, Visual Disturbances and Yellow Eyes. Respiratory Not Present- Bloody sputum, Chronic Cough, Difficulty Breathing, Snoring and Wheezing. Breast Not Present- Breast Mass, Breast Pain, Nipple Discharge and Skin Changes. Cardiovascular Not Present- Chest Pain, Difficulty Breathing Lying Down, Leg Cramps, Palpitations, Rapid Heart Rate, Shortness of Breath and Swelling of Extremities. Gastrointestinal Not Present- Abdominal Pain, Bloating, Bloody Stool, Change in Bowel Habits, Chronic diarrhea, Constipation, Difficulty Swallowing, Excessive gas, Gets full quickly at meals, Hemorrhoids, Indigestion, Nausea, Rectal Pain and Vomiting. Female Genitourinary Not Present- Frequency, Nocturia, Painful Urination, Pelvic Pain and Urgency. Musculoskeletal Not Present- Back Pain, Joint Pain, Joint Stiffness, Muscle Pain, Muscle Weakness and Swelling of Extremities. Neurological Not Present- Decreased Memory, Fainting, Headaches, Numbness, Seizures, Tingling, Tremor, Trouble walking and Weakness. Psychiatric Not Present- Anxiety, Bipolar, Change in Sleep Pattern, Depression, Fearful and Frequent crying. Endocrine Not Present- Cold Intolerance, Excessive Hunger, Hair Changes, Heat Intolerance, Hot flashes and New Diabetes. Hematology Not Present- Blood Thinners, Easy Bruising, Excessive bleeding, Gland problems, HIV and Persistent Infections.  Vitals  Weight: 189.6 lb Height: 63in Body Surface Area: 1.89 m Body Mass Index: 33.59 kg/m  Temp.: 97.89F  Pulse: 51 (Regular)  Resp.: 17 (Unlabored)  BP: 149/73(Sitting, Left Arm, Standard)       Physical Exam General Mental Status-Alert. General Appearance-Consistent with stated age. Hydration-Well hydrated. Voice-Normal.  Head and Neck Head-normocephalic, atraumatic with no lesions or palpable  masses. Trachea-midline. Thyroid Gland Characteristics - normal size and consistency.  Eye Eyeball -  Bilateral-Extraocular movements intact. Sclera/Conjunctiva - Bilateral-No scleral icterus.  Chest and Lung Exam Chest and lung exam reveals -quiet, even and easy respiratory effort with no use of accessory muscles and on auscultation, normal breath sounds, no adventitious sounds and normal vocal resonance. Inspection Chest Wall - Normal. Back - normal.  Breast Note: breasts are relatively symmetric. no palpable masses. no skin dimpling or nipple retraction. No nipple discharge. no significant bruising from biopsy. no LAD.   Cardiovascular Cardiovascular examination reveals -normal heart sounds, regular rate and rhythm with no murmurs and normal pedal pulses bilaterally.  Abdomen Inspection Inspection of the abdomen reveals - No Hernias. Palpation/Percussion Palpation and Percussion of the abdomen reveal - Soft, Non Tender, No Rebound tenderness, No Rigidity (guarding) and No hepatosplenomegaly. Auscultation Auscultation of the abdomen reveals - Bowel sounds normal.  Neurologic Neurologic evaluation reveals -alert and oriented x 3 with no impairment of recent or remote memory. Mental Status-Normal.  Musculoskeletal Global Assessment -Note: no gross deformities.  Normal Exam - Left-Upper Extremity Strength Normal and Lower Extremity Strength Normal. Normal Exam - Right-Upper Extremity Strength Normal and Lower Extremity Strength Normal.  Lymphatic Head & Neck  General Head & Neck Lymphatics: Bilateral - Description - Normal. Axillary  General Axillary Region: Bilateral - Description - Normal. Tenderness - Non Tender. Femoral & Inguinal  Generalized Femoral & Inguinal Lymphatics: Bilateral - Description - No Generalized lymphadenopathy.    Assessment & Plan  MALIGNANT NEOPLASM OF UPPER-OUTER QUADRANT OF LEFT BREAST IN FEMALE, ESTROGEN RECEPTOR  POSITIVE (C50.412) Impression: Pt has new diagnosis of cT1bN0 left breast cancer. Because it is lobular, we will get an MRI. If no positive findings are seen on the MR, we will plan seed localized lumpectomy wtih sentinel lymph node biopsy. This will be followed by radiation and antiestrogen tx. It is very unlikely that she would need oncotype or mammaprint unless the tumor ends up being much larger than we see.  The surgical procedure was described to the patient. I discussed the incision type and location and that we would need radiology involved on with a wire or seed marker and/or sentinel node.  The risks and benefits of the procedure were described to the patient and she wishes to proceed.  We discussed the risks bleeding, infection, damage to other structures, need for further procedures/surgeries. We discussed the risk of seroma. The patient was advised if the area in the breast in cancer, we may need to go back to surgery for additional tissue to obtain negative margins or for a lymph node biopsy. The patient was advised that these are the most common complications, but that others can occur as well. They were advised against taking aspirin or other anti-inflammatory agents/blood thinners the week before surgery.

## 2019-07-27 ENCOUNTER — Other Ambulatory Visit (HOSPITAL_COMMUNITY)
Admission: RE | Admit: 2019-07-27 | Discharge: 2019-07-27 | Disposition: A | Discharge status: Home / Self Care | Payer: Medicare Other | Source: Ambulatory Visit | Attending: General Surgery | Admitting: General Surgery

## 2019-07-27 DIAGNOSIS — Z20822 Contact with and (suspected) exposure to covid-19: Secondary | ICD-10-CM | POA: Insufficient documentation

## 2019-07-27 DIAGNOSIS — Z01812 Encounter for preprocedural laboratory examination: Secondary | ICD-10-CM | POA: Insufficient documentation

## 2019-07-27 LAB — SARS CORONAVIRUS 2 (TAT 6-24 HRS): SARS Coronavirus 2: NEGATIVE

## 2019-07-29 ENCOUNTER — Ambulatory Visit
Admission: RE | Admit: 2019-07-29 | Discharge: 2019-07-29 | Disposition: A | Discharge status: Home / Self Care | Payer: Medicare Other | Source: Ambulatory Visit | Attending: General Surgery | Admitting: General Surgery

## 2019-07-29 ENCOUNTER — Other Ambulatory Visit: Payer: Self-pay

## 2019-07-29 DIAGNOSIS — Z170 Estrogen receptor positive status [ER+]: Secondary | ICD-10-CM

## 2019-07-30 ENCOUNTER — Encounter (HOSPITAL_COMMUNITY)
Admission: RE | Disposition: A | Discharge status: Home / Self Care | Payer: Self-pay | Source: Home / Self Care | Attending: General Surgery

## 2019-07-30 ENCOUNTER — Encounter (HOSPITAL_COMMUNITY): Payer: Self-pay | Admitting: General Surgery

## 2019-07-30 ENCOUNTER — Ambulatory Visit (HOSPITAL_COMMUNITY)
Admission: RE | Admit: 2019-07-30 | Discharge: 2019-07-30 | Disposition: A | Discharge status: Home / Self Care | Payer: Medicare Other | Source: Ambulatory Visit | Attending: General Surgery | Admitting: General Surgery

## 2019-07-30 ENCOUNTER — Ambulatory Visit (HOSPITAL_COMMUNITY): Payer: Medicare Other | Admitting: Anesthesiology

## 2019-07-30 ENCOUNTER — Ambulatory Visit (HOSPITAL_COMMUNITY)
Admission: RE | Admit: 2019-07-30 | Discharge: 2019-07-30 | Disposition: A | Discharge status: Home / Self Care | Payer: Medicare Other | Attending: General Surgery | Admitting: General Surgery

## 2019-07-30 ENCOUNTER — Ambulatory Visit
Admission: RE | Admit: 2019-07-30 | Discharge: 2019-07-30 | Disposition: A | Discharge status: Home / Self Care | Payer: Medicare Other | Source: Ambulatory Visit | Attending: General Surgery | Admitting: General Surgery

## 2019-07-30 ENCOUNTER — Ambulatory Visit (HOSPITAL_COMMUNITY): Payer: Medicare Other | Admitting: Vascular Surgery

## 2019-07-30 ENCOUNTER — Other Ambulatory Visit: Payer: Self-pay

## 2019-07-30 DIAGNOSIS — Z170 Estrogen receptor positive status [ER+]: Secondary | ICD-10-CM | POA: Insufficient documentation

## 2019-07-30 DIAGNOSIS — E785 Hyperlipidemia, unspecified: Secondary | ICD-10-CM | POA: Diagnosis not present

## 2019-07-30 DIAGNOSIS — G8918 Other acute postprocedural pain: Secondary | ICD-10-CM | POA: Diagnosis not present

## 2019-07-30 DIAGNOSIS — C50412 Malignant neoplasm of upper-outer quadrant of left female breast: Secondary | ICD-10-CM | POA: Diagnosis not present

## 2019-07-30 DIAGNOSIS — Z8589 Personal history of malignant neoplasm of other organs and systems: Secondary | ICD-10-CM | POA: Diagnosis not present

## 2019-07-30 DIAGNOSIS — D360 Benign neoplasm of lymph nodes: Secondary | ICD-10-CM | POA: Insufficient documentation

## 2019-07-30 DIAGNOSIS — C50912 Malignant neoplasm of unspecified site of left female breast: Secondary | ICD-10-CM | POA: Diagnosis not present

## 2019-07-30 HISTORY — PX: BREAST LUMPECTOMY WITH RADIOACTIVE SEED AND SENTINEL LYMPH NODE BIOPSY: SHX6550

## 2019-07-30 HISTORY — PX: BREAST LUMPECTOMY: SHX2

## 2019-07-30 SURGERY — BREAST LUMPECTOMY WITH RADIOACTIVE SEED AND SENTINEL LYMPH NODE BIOPSY
Anesthesia: General | Site: Breast | Laterality: Left

## 2019-07-30 MED ORDER — BUPIVACAINE HCL (PF) 0.25 % IJ SOLN
INTRAMUSCULAR | Status: DC | PRN
Start: 2019-07-30 — End: 2019-07-30
  Administered 2019-07-30: 15 mL

## 2019-07-30 MED ORDER — FENTANYL CITRATE (PF) 100 MCG/2ML IJ SOLN
INTRAMUSCULAR | Status: AC
  Administered 2019-07-30: 100 ug via INTRAVENOUS
  Filled 2019-07-30: qty 2

## 2019-07-30 MED ORDER — CEFAZOLIN SODIUM-DEXTROSE 2-4 GM/100ML-% IV SOLN
2.0000 g | INTRAVENOUS | Status: AC
  Administered 2019-07-30: 2 g via INTRAVENOUS
  Filled 2019-07-30: qty 100

## 2019-07-30 MED ORDER — LIDOCAINE-EPINEPHRINE 1 %-1:100000 IJ SOLN
INTRAMUSCULAR | Status: AC
  Filled 2019-07-30: qty 1

## 2019-07-30 MED ORDER — LIDOCAINE HCL 1 % IJ SOLN: INTRAMUSCULAR | Status: DC | PRN

## 2019-07-30 MED ORDER — ORAL CARE MOUTH RINSE: 15.0000 mL | Freq: Once | OROMUCOSAL | Status: AC

## 2019-07-30 MED ORDER — CHLORHEXIDINE GLUCONATE CLOTH 2 % EX PADS: 6.0000 | MEDICATED_PAD | Freq: Once | CUTANEOUS | Status: DC

## 2019-07-30 MED ORDER — DEXAMETHASONE SODIUM PHOSPHATE 10 MG/ML IJ SOLN
INTRAMUSCULAR | Status: AC
  Filled 2019-07-30: qty 1

## 2019-07-30 MED ORDER — CHLORHEXIDINE GLUCONATE 0.12 % MT SOLN
OROMUCOSAL | Status: AC
  Administered 2019-07-30: 15 mL via OROMUCOSAL
  Filled 2019-07-30: qty 15

## 2019-07-30 MED ORDER — LACTATED RINGERS IV SOLN: INTRAVENOUS | Status: DC | PRN

## 2019-07-30 MED ORDER — ENSURE PRE-SURGERY PO LIQD: 296.0000 mL | Freq: Once | ORAL | Status: DC

## 2019-07-30 MED ORDER — BUPIVACAINE HCL (PF) 0.25 % IJ SOLN
INTRAMUSCULAR | Status: AC
  Filled 2019-07-30: qty 30

## 2019-07-30 MED ORDER — MIDAZOLAM HCL 2 MG/2ML IJ SOLN: 2.0000 mg | Freq: Once | INTRAMUSCULAR | Status: AC

## 2019-07-30 MED ORDER — PROPOFOL 10 MG/ML IV BOLUS
INTRAVENOUS | Status: AC
  Filled 2019-07-30: qty 20

## 2019-07-30 MED ORDER — CHLORHEXIDINE GLUCONATE 0.12 % MT SOLN: 15.0000 mL | Freq: Once | OROMUCOSAL | Status: AC

## 2019-07-30 MED ORDER — LIDOCAINE 2% (20 MG/ML) 5 ML SYRINGE
INTRAMUSCULAR | Status: DC | PRN
  Administered 2019-07-30: 100 mg via INTRAVENOUS

## 2019-07-30 MED ORDER — ONDANSETRON HCL 4 MG/2ML IJ SOLN
INTRAMUSCULAR | Status: DC | PRN
  Administered 2019-07-30: 4 mg via INTRAVENOUS

## 2019-07-30 MED ORDER — PROPOFOL 10 MG/ML IV BOLUS
INTRAVENOUS | Status: DC | PRN
  Administered 2019-07-30: 130 mg via INTRAVENOUS

## 2019-07-30 MED ORDER — TECHNETIUM TC 99M SULFUR COLLOID FILTERED
1.0000 | Freq: Once | INTRAVENOUS | Status: AC | PRN
  Administered 2019-07-30: 1 via INTRADERMAL

## 2019-07-30 MED ORDER — ONDANSETRON HCL 4 MG/2ML IJ SOLN
INTRAMUSCULAR | Status: AC
  Filled 2019-07-30: qty 2

## 2019-07-30 MED ORDER — FENTANYL CITRATE (PF) 100 MCG/2ML IJ SOLN: 50.0000 ug | Freq: Once | INTRAMUSCULAR | Status: DC

## 2019-07-30 MED ORDER — OXYCODONE HCL 5 MG PO TABS
5.0000 mg | ORAL_TABLET | ORAL | 0 refills | Status: DC | PRN
Start: 2019-07-30 — End: 2019-11-02

## 2019-07-30 MED ORDER — PHENYLEPHRINE HCL-NACL 10-0.9 MG/250ML-% IV SOLN
INTRAVENOUS | Status: DC | PRN
  Administered 2019-07-30: 25 ug/min via INTRAVENOUS

## 2019-07-30 MED ORDER — LIDOCAINE 2% (20 MG/ML) 5 ML SYRINGE
INTRAMUSCULAR | Status: AC
  Filled 2019-07-30: qty 5

## 2019-07-30 MED ORDER — MIDAZOLAM HCL 2 MG/2ML IJ SOLN
INTRAMUSCULAR | Status: AC
  Administered 2019-07-30: 2 mg via INTRAVENOUS
  Filled 2019-07-30: qty 2

## 2019-07-30 MED ORDER — FENTANYL CITRATE (PF) 250 MCG/5ML IJ SOLN
INTRAMUSCULAR | Status: AC
  Filled 2019-07-30: qty 5

## 2019-07-30 MED ORDER — FENTANYL CITRATE (PF) 100 MCG/2ML IJ SOLN: 100.0000 ug | Freq: Once | INTRAMUSCULAR | Status: AC

## 2019-07-30 MED ORDER — MIDAZOLAM HCL 2 MG/2ML IJ SOLN: 1.0000 mg | Freq: Once | INTRAMUSCULAR | Status: DC

## 2019-07-30 MED ORDER — LACTATED RINGERS IV SOLN: INTRAVENOUS | Status: DC

## 2019-07-30 MED ORDER — LIDOCAINE-EPINEPHRINE 1 %-1:100000 IJ SOLN
INTRAMUSCULAR | Status: DC | PRN
  Administered 2019-07-30: 15 mL

## 2019-07-30 MED ORDER — 0.9 % SODIUM CHLORIDE (POUR BTL) OPTIME
TOPICAL | Status: DC | PRN
  Administered 2019-07-30: 1000 mL

## 2019-07-30 MED ORDER — ACETAMINOPHEN 500 MG PO TABS
1000.0000 mg | ORAL_TABLET | ORAL | Status: AC
  Administered 2019-07-30: 1000 mg via ORAL
  Filled 2019-07-30: qty 2

## 2019-07-30 MED ORDER — FENTANYL CITRATE (PF) 250 MCG/5ML IJ SOLN
INTRAMUSCULAR | Status: DC | PRN
  Administered 2019-07-30: 50 ug via INTRAVENOUS

## 2019-07-30 MED ORDER — DEXAMETHASONE SODIUM PHOSPHATE 10 MG/ML IJ SOLN
INTRAMUSCULAR | Status: DC | PRN
  Administered 2019-07-30: 10 mg via INTRAVENOUS

## 2019-07-30 MED ORDER — BUPIVACAINE LIPOSOME 1.3 % IJ SUSP
INTRAMUSCULAR | Status: DC | PRN
Start: 2019-07-30 — End: 2019-07-30
  Administered 2019-07-30: 10 mL

## 2019-07-30 MED ORDER — BUPIVACAINE-EPINEPHRINE (PF) 0.5% -1:200000 IJ SOLN
INTRAMUSCULAR | Status: DC | PRN
  Administered 2019-07-30: 18 mL

## 2019-07-30 SURGICAL SUPPLY — 44 items
BINDER BREAST LRG (GAUZE/BANDAGES/DRESSINGS) IMPLANT
BINDER BREAST XLRG (GAUZE/BANDAGES/DRESSINGS) ×3 IMPLANT
BINDER BREAST XXLRG (GAUZE/BANDAGES/DRESSINGS) ×3 IMPLANT
BNDG COHESIVE 4X5 TAN STRL (GAUZE/BANDAGES/DRESSINGS) ×3 IMPLANT
CANISTER SUCT 3000ML PPV (MISCELLANEOUS) ×3 IMPLANT
CHLORAPREP W/TINT 26 (MISCELLANEOUS) ×3 IMPLANT
CLIP VESOCCLUDE LG 6/CT (CLIP) ×3 IMPLANT
CLIP VESOCCLUDE MED 6/CT (CLIP) ×3 IMPLANT
CLIP VESOCCLUDE SM WIDE 6/CT (CLIP) ×3 IMPLANT
CNTNR URN SCR LID CUP LEK RST (MISCELLANEOUS) IMPLANT
CONT SPEC 4OZ STRL OR WHT (MISCELLANEOUS)
COVER PROBE W GEL 5X96 (DRAPES) ×3 IMPLANT
COVER SURGICAL LIGHT HANDLE (MISCELLANEOUS) ×3 IMPLANT
COVER WAND RF STERILE (DRAPES) ×3 IMPLANT
DERMABOND ADVANCED (GAUZE/BANDAGES/DRESSINGS) ×2
DERMABOND ADVANCED .7 DNX12 (GAUZE/BANDAGES/DRESSINGS) ×1 IMPLANT
DEVICE DUBIN SPECIMEN MAMMOGRA (MISCELLANEOUS) IMPLANT
DRAPE CHEST BREAST 15X10 FENES (DRAPES) ×3 IMPLANT
ELECT COATED BLADE 2.86 ST (ELECTRODE) ×3 IMPLANT
ELECT NEEDLE BLADE 2-5/6 (NEEDLE) ×3 IMPLANT
ELECT REM PT RETURN 9FT ADLT (ELECTROSURGICAL) ×3
ELECTRODE REM PT RTRN 9FT ADLT (ELECTROSURGICAL) ×1 IMPLANT
GLOVE BIO SURGEON STRL SZ 6 (GLOVE) ×3 IMPLANT
GLOVE INDICATOR 6.5 STRL GRN (GLOVE) ×3 IMPLANT
GOWN STRL REUS W/ TWL LRG LVL3 (GOWN DISPOSABLE) ×1 IMPLANT
GOWN STRL REUS W/TWL 2XL LVL3 (GOWN DISPOSABLE) ×3 IMPLANT
GOWN STRL REUS W/TWL LRG LVL3 (GOWN DISPOSABLE) ×3
KIT BASIN OR (CUSTOM PROCEDURE TRAY) ×3 IMPLANT
KIT MARKER MARGIN INK (KITS) ×3 IMPLANT
LIGHT WAVEGUIDE WIDE FLAT (MISCELLANEOUS) IMPLANT
NEEDLE 18GX1X1/2 (RX/OR ONLY) (NEEDLE) IMPLANT
NEEDLE FILTER BLUNT 18X 1/2SAF (NEEDLE)
NEEDLE FILTER BLUNT 18X1 1/2 (NEEDLE) IMPLANT
NEEDLE HYPO 25GX1X1/2 BEV (NEEDLE) ×3 IMPLANT
NS IRRIG 1000ML POUR BTL (IV SOLUTION) ×3 IMPLANT
PACK GENERAL/GYN (CUSTOM PROCEDURE TRAY) ×3 IMPLANT
PACK UNIVERSAL I (CUSTOM PROCEDURE TRAY) ×3 IMPLANT
PAD ABD 8X10 STRL (GAUZE/BANDAGES/DRESSINGS) ×3 IMPLANT
STOCKINETTE IMPERVIOUS 9X36 MD (GAUZE/BANDAGES/DRESSINGS) ×3 IMPLANT
SUT MNCRL AB 4-0 PS2 18 (SUTURE) ×3 IMPLANT
SUT VIC AB 3-0 SH 8-18 (SUTURE) ×3 IMPLANT
SYR CONTROL 10ML LL (SYRINGE) ×3 IMPLANT
TOWEL GREEN STERILE (TOWEL DISPOSABLE) ×3 IMPLANT
TOWEL GREEN STERILE FF (TOWEL DISPOSABLE) ×3 IMPLANT

## 2019-07-30 NOTE — Anesthesia Postprocedure Evaluation (Signed)
Anesthesia Post Note  Patient: Molly Fuller  Procedure(s) Performed: LEFT BREAST LUMPECTOMY WITH RADIOACTIVE SEED AND SENTINEL LYMPH NODE BIOPSY (Left Breast)     Patient location during evaluation: PACU Anesthesia Type: General Level of consciousness: awake and alert Pain management: pain level controlled Vital Signs Assessment: post-procedure vital signs reviewed and stable Respiratory status: spontaneous breathing, nonlabored ventilation, respiratory function stable and patient connected to nasal cannula oxygen Cardiovascular status: blood pressure returned to baseline and stable Postop Assessment: no apparent nausea or vomiting Anesthetic complications: no   No complications documented.  Last Vitals:  Vitals:   07/30/19 1326 07/30/19 1329  BP: (!) 159/52   Pulse: (!) 46   Resp: 11   Temp:  (!) 36.1 C  SpO2: 99%     Last Pain:  Vitals:   07/30/19 1329  TempSrc:   PainSc: 0-No pain                 Barnet Glasgow

## 2019-07-30 NOTE — Anesthesia Procedure Notes (Addendum)
Anesthesia Regional Block: Pectoralis block   Pre-Anesthetic Checklist: ,, timeout performed, Correct Patient, Correct Site, Correct Laterality, Correct Procedure, Correct Position, site marked, Risks and benefits discussed,  Surgical consent,  Pre-op evaluation,  At surgeon's request and post-op pain management  Laterality: Left  Prep: chloraprep       Needles:  Injection technique: Single-shot  Needle Type: Echogenic Needle     Needle Length: 9cm  Needle Gauge: 21     Additional Needles:   Procedures:,,,, ultrasound used (permanent image in chart),,,,  Narrative:  Start time: 07/30/2019 10:50 AM End time: 07/30/2019 11:03 AM Injection made incrementally with aspirations every 5 mL.  Performed by: Personally  Anesthesiologist: Barnet Glasgow, MD  Additional Notes: Block assessed. Patient tolerated procedure well.

## 2019-07-30 NOTE — Anesthesia Procedure Notes (Signed)
Procedure Name: LMA Insertion Date/Time: 07/30/2019 11:20 AM Performed by: Kyung Rudd, CRNA Pre-anesthesia Checklist: Patient identified, Emergency Drugs available, Suction available and Patient being monitored Patient Re-evaluated:Patient Re-evaluated prior to induction Oxygen Delivery Method: Circle system utilized Preoxygenation: Pre-oxygenation with 100% oxygen Induction Type: IV induction LMA: LMA inserted LMA Size: 4.0 Number of attempts: 1 Placement Confirmation: positive ETCO2 and breath sounds checked- equal and bilateral Tube secured with: Tape Dental Injury: Teeth and Oropharynx as per pre-operative assessment

## 2019-07-30 NOTE — Op Note (Signed)
left Breast Radioactive seed localized lumpectomy and sentinel lymph node biopsy  Indications: This patient presents with history of left breast cancer  Pre-operative Diagnosis: Left breast cancer, invasive mammary (lobular phenotype), grade 2, upper outer quadrant, +/-/-  Post-operative Diagnosis: Same  Assistant:  Judyann Munson, RNFA  Surgeon: Stark Klein   Anesthesia: General endotracheal anesthesia  ASA Class: 2  Procedure Details  The patient was seen in the Holding Room. The risks, benefits, complications, treatment options, and expected outcomes were discussed with the patient. The possibilities of bleeding, infection, the need for additional procedures, failure to diagnose a condition, and creating a complication requiring transfusion or operation were discussed with the patient. The patient concurred with the proposed plan, giving informed consent.  The site of surgery properly noted/marked. The patient was taken to Operating Room # 8, identified, and the procedure verified as Left Breast seed localized Lumpectomy with sentinel lymph node biopsy. A Time Out was held and the above information confirmed.  The left arm, breast, and chest were prepped and draped in standard fashion. The lumpectomy was performed by creating a lateral incision near the previously placed radioactive seed.  Dissection was carried down to around the point of maximum signal intensity. The cautery was used to perform the dissection.  Hemostasis was achieved with cautery. The edges of the cavity were marked with large clips, with one each medial, lateral, inferior and superior, and two clips posteriorly.   The specimen was inked with the margin marker paint kit.    Specimen radiography confirmed inclusion of the mammographic lesion, the clip, and the seed.  The background signal in the breast was zero.  The wound was irrigated and closed with 3-0 vicryl in layers and 4-0 monocryl subcuticular suture.    Using a  hand-held gamma probe, left axillary sentinel nodes were identified transcutaneously.  An oblique incision was created below the axillary hairline.  Dissection was carried through the clavipectoral fascia.  Two level 2 deep axillary sentinel nodes were removed.  Counts per second were 35 and 19.    The background count was 2 cps.  The wound was irrigated.  Hemostasis was achieved with cautery.  The axillary incision was closed with a 3-0 vicryl deep dermal interrupted sutures and a 4-0 monocryl subcuticular closure.    Sterile dressings were applied. At the end of the operation, all sponge, instrument, and needle counts were correct.  Findings: grossly clear surgical margins and no adenopathy, anterior margin is skin, posterior margin is pectoralis.   Estimated Blood Loss:  min         Specimens: left breast lumpectomy with seed and left axillary sentinel lymph nodes.             Complications:  None; patient tolerated the procedure well.         Disposition: PACU - hemodynamically stable.         Condition: stable

## 2019-07-30 NOTE — Interval H&P Note (Signed)
History and Physical Interval Note:  07/30/2019 10:52 AM  Molly Fuller  has presented today for surgery, with the diagnosis of LEFT BREAST CANCER.  The various methods of treatment have been discussed with the patient and family. After consideration of risks, benefits and other options for treatment, the patient has consented to  Procedure(s) with comments: LEFT BREAST LUMPECTOMY WITH RADIOACTIVE SEED AND SENTINEL LYMPH NODE BIOPSY (Left) - REGIONAL FOR POST OP PAIN as a surgical intervention.  The patient's history has been reviewed, patient examined, no change in status, stable for surgery.  I have reviewed the patient's chart and labs.  Questions were answered to the patient's satisfaction.     Stark Klein

## 2019-07-30 NOTE — Transfer of Care (Signed)
Immediate Anesthesia Transfer of Care Note  Patient: Molly Fuller  Procedure(s) Performed: LEFT BREAST LUMPECTOMY WITH RADIOACTIVE SEED AND SENTINEL LYMPH NODE BIOPSY (Left Breast)  Patient Location: PACU  Anesthesia Type:GA combined with regional for post-op pain  Level of Consciousness: awake, alert  and oriented  Airway & Oxygen Therapy: Patient Spontanous Breathing and Patient connected to nasal cannula oxygen  Post-op Assessment: Report given to RN, Post -op Vital signs reviewed and stable and Patient moving all extremities  Post vital signs: Reviewed and stable  Last Vitals:  Vitals Value Taken Time  BP 145/67 07/30/19 1256  Temp    Pulse 60 07/30/19 1300  Resp 17 07/30/19 1300  SpO2 100 % 07/30/19 1300  Vitals shown include unvalidated device data.  Last Pain:  Vitals:   07/30/19 1110  TempSrc:   PainSc: 0-No pain      Patients Stated Pain Goal: 3 (15/18/34 3735)  Complications: No complications documented.

## 2019-08-02 ENCOUNTER — Encounter (HOSPITAL_COMMUNITY): Payer: Self-pay | Admitting: General Surgery

## 2019-08-03 LAB — SURGICAL PATHOLOGY

## 2019-08-04 ENCOUNTER — Encounter: Payer: Self-pay | Admitting: *Deleted

## 2019-08-04 DIAGNOSIS — C50412 Malignant neoplasm of upper-outer quadrant of left female breast: Secondary | ICD-10-CM

## 2019-08-13 ENCOUNTER — Encounter: Payer: Self-pay | Admitting: *Deleted

## 2019-08-18 NOTE — Progress Notes (Signed)
Patient was seen in multidisciplinary breast clinic for diagnosis of left breast cancer.  Histology per Pathology Report: Left Lumpectomy 07/30/2019  Receptor Status: ER(100% +), PR (0% -), Her2-neu (-), Ki-67(10%)    Lymphedema issues, if any: No    Has a little residual tenderness from surgery.  No drainage noted.  SAFETY ISSUES:  Prior radiation? No  Pacemaker/ICD? No  Possible current pregnancy? Postmenopausal  Is the patient on methotrexate? No  Current Complaints / other details:      Cori Razor, RN 08/18/2019,3:18 PM

## 2019-08-19 ENCOUNTER — Other Ambulatory Visit: Payer: Self-pay

## 2019-08-19 ENCOUNTER — Encounter: Payer: Self-pay | Admitting: Physical Therapy

## 2019-08-19 ENCOUNTER — Ambulatory Visit: Payer: Medicare Other | Attending: General Surgery | Admitting: Physical Therapy

## 2019-08-19 ENCOUNTER — Ambulatory Visit
Admission: RE | Admit: 2019-08-19 | Discharge: 2019-08-19 | Disposition: A | Discharge status: Home / Self Care | Payer: Medicare Other | Source: Ambulatory Visit | Attending: Radiation Oncology | Admitting: Radiation Oncology

## 2019-08-19 ENCOUNTER — Encounter: Payer: Self-pay | Admitting: Radiation Oncology

## 2019-08-19 VITALS — BP 149/69 | HR 61 | Temp 97.4°F | Resp 20 | Ht 66.0 in | Wt 188.2 lb

## 2019-08-19 DIAGNOSIS — C50412 Malignant neoplasm of upper-outer quadrant of left female breast: Secondary | ICD-10-CM | POA: Insufficient documentation

## 2019-08-19 DIAGNOSIS — Z510 Encounter for antineoplastic radiation therapy: Secondary | ICD-10-CM | POA: Diagnosis not present

## 2019-08-19 DIAGNOSIS — Z170 Estrogen receptor positive status [ER+]: Secondary | ICD-10-CM | POA: Insufficient documentation

## 2019-08-19 DIAGNOSIS — R293 Abnormal posture: Secondary | ICD-10-CM | POA: Diagnosis not present

## 2019-08-19 DIAGNOSIS — E785 Hyperlipidemia, unspecified: Secondary | ICD-10-CM | POA: Diagnosis not present

## 2019-08-19 DIAGNOSIS — Z800 Family history of malignant neoplasm of digestive organs: Secondary | ICD-10-CM | POA: Insufficient documentation

## 2019-08-19 DIAGNOSIS — Z483 Aftercare following surgery for neoplasm: Secondary | ICD-10-CM

## 2019-08-19 DIAGNOSIS — Z79899 Other long term (current) drug therapy: Secondary | ICD-10-CM | POA: Diagnosis not present

## 2019-08-19 DIAGNOSIS — I10 Essential (primary) hypertension: Secondary | ICD-10-CM | POA: Diagnosis not present

## 2019-08-19 NOTE — Therapy (Signed)
[place], [place], [address] Phone: [phone]   Fax:  [phone]  Physical Therapy Treatment  Patient Details  Name: Molly Fuller MRN: [id number] Date of Birth: 11-21-1950 Referring Provider (PT): Dr. Stark Klein   Encounter Date: 08/19/2019   PT End of Session - 08/19/19 1156    Visit Number 2    Number of Visits 2    PT Start Time 1105    PT Stop Time 1135    PT Time Calculation (min) 30 min    Activity Tolerance Patient tolerated treatment well    Behavior During Therapy [facility] for tasks assessed/performed           Past Medical History:  Diagnosis Date  . Glaucoma    left eye  . Hyperlipidemia   . Hypertension    NO MEDS    Past Surgical History:  Procedure Laterality Date  . BREAST LUMPECTOMY WITH RADIOACTIVE SEED AND SENTINEL LYMPH NODE BIOPSY Left 07/30/2019   Procedure: LEFT BREAST LUMPECTOMY WITH RADIOACTIVE SEED AND SENTINEL LYMPH NODE BIOPSY;  Surgeon: Stark Klein, MD;  Location: [facility];  Service: General;  Laterality: Left;  REGIONAL FOR POST OP PAIN  . no prior surgery    . TUBAL LIGATION      There were no vitals filed for this visit.   Subjective Assessment - 08/19/19 1105    Subjective Patient underwent a left lumpectomy and sentinel node biopsy (2 negative nodes) on 07/30/2019. She underwent radiation simulation this morning and begins treatment 08/30/2019.    Pertinent History Patient was diagnosed on 05/28/2019 with left grade II invasive lobular carcinoma breast cancer. Patient underwent a left lumpectomy and sentinel node biopsy (2 negative nodes) on 07/30/2019. It is ER positive, PR negative, and HER2 negative with a Ki67 of 10%.    Patient Stated Goals Make sure arm is ok    Currently in Pain? Yes    Pain Score 1     Pain Location Axilla    Pain Orientation Left    Pain Descriptors / Indicators Tender    Pain Type Surgical pain    Pain Onset 1 to 4 weeks ago     Pain Frequency Intermittent    Aggravating Factors  Touching it    Pain Relieving Factors Not touching it              [facility] PT Assessment - 08/19/19 0001      Assessment   Medical Diagnosis s/p left lumpectomy and SLNB    Referring Provider (PT) Dr. Stark Klein    Onset Date/Surgical Date 07/30/19    Hand Dominance Right    Prior Therapy Baselines      Precautions   Precautions Other (comment)    Precaution Comments recent breast cancer surgery      Restrictions   Weight Bearing Restrictions No      Balance Screen   Has the patient fallen in the past 6 months No    Has the patient had a decrease in activity level because of a fear of falling?  No    Is the patient reluctant to leave their home because of a fear of falling?  No      Home Social worker Private residence    Living Arrangements Spouse/significant other    Available Help at Discharge Family      Prior Function   Level of [place] Retired  Leisure Doing recumbant bike 3x/week for 30 min      Cognition   Overall Cognitive Status Within Functional Limits for tasks assessed      Observation/Other Assessments   Observations Incisions appear to be well healed. There is some palpable hardness present just superior to her axillary incision which is likely scar tissue or organized edema. She was encouraged to begin scar massage after seeing her surgeon tomorrow.      Posture/Postural Control   Posture/Postural Control Postural limitations    Postural Limitations Rounded Shoulders;Forward head      ROM / Strength   AROM / PROM / Strength AROM      AROM   AROM Assessment Site Shoulder    Right/Left Shoulder Left    Left Shoulder Extension 55 Degrees    Left Shoulder Flexion 145 Degrees    Left Shoulder ABduction 154 Degrees    Left Shoulder Internal Rotation 83 Degrees    Left Shoulder External Rotation 66 Degrees      Strength   Overall Strength  Within functional limits for tasks performed             LYMPHEDEMA/ONCOLOGY QUESTIONNAIRE - 08/19/19 0001      Type   Cancer Type Left breast cancer      Surgeries   Lumpectomy Date 07/30/19    Sentinel Lymph Node Biopsy Date 07/30/19    Number Lymph Nodes Removed 2      Treatment   Active Chemotherapy Treatment No    Past Chemotherapy Treatment No    Active Radiation Treatment No    Past Radiation Treatment No    Current Hormone Treatment No    Past Hormone Therapy No      What other symptoms do you have   Are you Having Heaviness or Tightness No    Are you having Pain Yes    Are you having pitting edema No    Is it Hard or Difficult finding clothes that fit No    Do you have infections No    Is there Decreased scar mobility Yes    [place] Sign No      Lymphedema Assessments   Lymphedema Assessments Upper extremities      Right Upper Extremity Lymphedema   10 cm Proximal to Olecranon Process 35.3 cm    Olecranon Process 25.6 cm    10 cm Proximal to Ulnar Styloid Process 23.1 cm    Just Proximal to Ulnar Styloid Process 16.4 cm    Across Hand at [employer] 19 cm    At [place] of 2nd Digit 6 cm      Left Upper Extremity Lymphedema   10 cm Proximal to Olecranon Process 35.2 cm    Olecranon Process 25.8 cm    10 cm Proximal to Ulnar Styloid Process 22.8 cm    Just Proximal to Ulnar Styloid Process 16.3 cm    Across Hand at [employer] 19.1 cm    At [place] of 2nd Digit 6.4 cm              Quick Dash - 08/19/19 0001    Open a tight or new jar No difficulty    Do heavy household chores (wash walls, wash floors) No difficulty    Carry a shopping bag or briefcase No difficulty    Wash your back No difficulty    Use a knife to cut food No difficulty    Recreational activities in which you take some  force or impact through your arm, shoulder, or hand (golf, hammering, tennis) No difficulty    During the past week, to what extent has your arm, shoulder or  hand problem interfered with your normal social activities with family, friends, neighbors, or groups? Not at all    During the past week, to what extent has your arm, shoulder or hand problem limited your work or other regular daily activities Not at all    Arm, shoulder, or hand pain. None    Tingling (pins and needles) in your arm, shoulder, or hand None    Difficulty Sleeping No difficulty    DASH Score 0 %                          PT Education - 08/19/19 1156    Education Details Scar massage using coconut oil; aftercare from surgery    Person(s) Educated Patient    Methods Explanation;Demonstration;Handout    Comprehension Verbalized understanding               PT Long Term Goals - 08/19/19 1159      PT LONG TERM GOAL #1   Title Patient will demonstrate she has regained full shoulder ROM and function post operatively compared to baselines.    Time 8    Period Weeks    Status Achieved                 Plan - 08/19/19 1156    Clinical Impression Statement Patient is doing very well s/p left lumpectomy and sentinel node biopsy. She has regained full shoulder ROM and function, shows no sign of lymphedema, and incisions appear to be well healed. She plans to attend the After Breast Cancer class on 09/06/2019 to become educated on lympehdema risk reduction but otherwise, has no PT needs.    PT Treatment/Interventions ADLs/Self Care Home Management;Therapeutic exercise;Patient/family education    PT Next Visit Plan D/C; continue L-Dex screens every 3 months    PT Home Exercise Plan Post op shoulder ROM HEP    Consulted and Agree with Plan of Care Patient           Patient will benefit from skilled therapeutic intervention in order to improve the following deficits and impairments:  Decreased knowledge of precautions, Impaired UE functional use, Pain, Postural dysfunction, Decreased range of motion  Visit Diagnosis: Malignant neoplasm of upper-outer  quadrant of left breast in female, estrogen receptor positive (HCC)  Abnormal posture  Aftercare following surgery for neoplasm     Problem List Patient Active Problem List   Diagnosis Date Noted  . Malignant neoplasm of upper-outer quadrant of left breast in female, estrogen receptor positive ([place]) 06/22/2019  . Constipation 12/30/2017  . Hyperlipidemia type III 12/30/2017  . Class 1 obesity with serious comorbidity and body mass index (BMI) of 30.0 to 30.9 in adult 01/09/2015  . Routine general medical examination at a health care facility 09/05/2011  . Glaucoma 09/05/2011  . Hyperlipidemia 12/15/2006   PHYSICAL THERAPY DISCHARGE SUMMARY  Visits from Start of Care: 2  Current functional level related to goals / functional outcomes: Goals met. See above for objective findings.   Remaining deficits: None   Education / Equipment: HEP  Plan: Patient agrees to discharge.  Patient goals were met. Patient is being discharged due to meeting the stated rehab goals.  ?????         Annia Friendly, [place] 08/19/19 12:01 PM  [facility]  [place], [place], [address] Phone: [phone]   Fax:  [phone]  Name: ALYSAH CARTON MRN: [id number] Date of Birth: 08-Mar-1950

## 2019-08-19 NOTE — Progress Notes (Signed)
Radiation Oncology         [phone] ________________________________  Name: Molly Fuller        MRN: [id number]  Date of Service: 08/19/2019 DOB: June 05, 1950  SH:FWYOVZ, Molly So, MD  Truitt Merle, MD     REFERRING PHYSICIAN: Truitt Merle, MD   DIAGNOSIS: The encounter diagnosis was Malignant neoplasm of upper-outer quadrant of left breast in female, estrogen receptor positive ([facility]).   HISTORY OF PRESENT ILLNESS: Molly Fuller is a 69 y.o. female originally seen in the multidisciplinary breast clinic for a new diagnosis of left breast cancer. The patient was noted to have a screening detected left breast mass.  Diagnostic imaging revealed a mass at 2:00 measuring 8 x 8 x 7 mm in greatest dimension.  Her left axilla was negative for adenopathy.  A biopsy on 06/15/2019 was performed of the breast at 2:00, and revealed a grade 2 invasive lobular carcinoma.  Her markers were ER positive, PR negative, HER-2 negative with a Ki-67 of 10%.  She has undergone left lumpectomy with sentinel node biopsy on 07/30/19 that revealed a grade 2, invasive lobular carcinoma measuring 1 cm, with her closest margin being the lateral margin at 1 mm. She had two sentinel nodes that were negative for disease. She is seen today to discuss adjuvant radiotherapy.  PREVIOUS RADIATION THERAPY: No   PAST MEDICAL HISTORY:  Past Medical History:  Diagnosis Date  . Glaucoma    left eye  . Hyperlipidemia   . Hypertension    NO MEDS       PAST SURGICAL HISTORY: Past Surgical History:  Procedure Laterality Date  . BREAST LUMPECTOMY WITH RADIOACTIVE SEED AND SENTINEL LYMPH NODE BIOPSY Left 07/30/2019   Procedure: LEFT BREAST LUMPECTOMY WITH RADIOACTIVE SEED AND SENTINEL LYMPH NODE BIOPSY;  Surgeon: Stark Klein, MD;  Location: [facility];  Service: General;  Laterality: Left;  REGIONAL FOR POST OP PAIN  . no prior surgery    . TUBAL LIGATION       FAMILY HISTORY:  Family History  Problem Relation Age of Onset  .  Colon cancer Paternal Grandmother 27  . Hypertension Other   . Diabetes Other   . Breast cancer Neg Hx      SOCIAL HISTORY:  reports that she has never smoked. She has never used smokeless tobacco. She reports that she does not drink alcohol and does not use drugs. The patient is married and lives in [place]. She is very active and enjoys working in the garden.   ALLERGIES: Bee venom   MEDICATIONS:  Current Outpatient Medications  Medication Sig Dispense Refill  . atorvastatin (LIPITOR) 20 MG tablet Take 1 tablet (20 mg total) by mouth daily. 90 tablet 3  . brimonidine-timolol (COMBIGAN) 0.2-0.5 % ophthalmic solution Place 1 drop into both eyes every 12 (twelve) hours.    . dorzolamide (TRUSOPT) 2 % ophthalmic solution Place 1 drop into both eyes 2 (two) times daily.   11  . EPINEPHrine 0.3 mg/0.3 mL IJ SOAJ injection     . latanoprost (XALATAN) 0.005 % ophthalmic solution Place 1 drop into both eyes at bedtime.     Marland Kitchen oxyCODONE (OXY IR/ROXICODONE) 5 MG immediate release tablet Take 1 tablet (5 mg total) by mouth every 4 (four) hours as needed for severe pain. 10 tablet 0   No current facility-administered medications for this encounter.     REVIEW OF SYSTEMS: On review of systems, the patient reports that she is doing well overall. She  is feeling pretty well after surgery and feels like she is healing appropriately. No specific concerns are noted.     PHYSICAL EXAM:  Wt Readings from Last 3 Encounters:  07/30/19 185 lb (83.9 kg)  07/22/19 187 lb 4.8 oz (85 kg)  06/23/19 189 lb 9.6 oz (86 kg)   Temp Readings from Last 3 Encounters:  07/30/19 (!) 97 F (36.1 C)  07/22/19 98 F (36.7 C) (Oral)  06/23/19 97.8 F (36.6 C) (Temporal)   BP Readings from Last 3 Encounters:  07/30/19 (!) 159/52  07/22/19 (!) 146/77  06/23/19 (!) 149/73   Pulse Readings from Last 3 Encounters:  07/30/19 (!) 49  07/22/19 (!) 52  06/23/19 (!) 51    In general this is a well appearing  African American female in no acute distress. She's alert and oriented x4 and appropriate throughout the examination. Cardiopulmonary assessment is negative for acute distress and she exhibits normal effort. Her left breast incision site is well healed without cellulitic changes or significant seroma. No edema of the LUE is noted.    ECOG = 0  0 - Asymptomatic (Fully active, able to carry on all predisease activities without restriction)  1 - Symptomatic but completely ambulatory (Restricted in physically strenuous activity but ambulatory and able to carry out work of a light or sedentary nature. For example, light housework, office work)  2 - Symptomatic, <50% in bed during the day (Ambulatory and capable of all self care but unable to carry out any work activities. Up and about more than 50% of waking hours)  3 - Symptomatic, >50% in bed, but not bedbound (Capable of only limited self-care, confined to bed or chair 50% or more of waking hours)  4 - Bedbound (Completely disabled. Cannot carry on any self-care. Totally confined to bed or chair)  5 - Death   Eustace Pen MM, Creech RH, Tormey DC, et al. [phone]). "Toxicity and response criteria of the [facility]". [place] Oncol. 5 (6): 649-55    LABORATORY DATA:  Lab Results  Component Value Date   WBC 4.3 07/22/2019   HGB 14.4 07/22/2019   HCT 45.0 07/22/2019   MCV 89.5 07/22/2019   PLT 216 07/22/2019   Lab Results  Component Value Date   NA 139 07/22/2019   K 4.1 07/22/2019   CL 105 07/22/2019   CO2 24 07/22/2019   Lab Results  Component Value Date   ALT 14 06/23/2019   AST 22 06/23/2019   ALKPHOS 64 06/23/2019   BILITOT 1.0 06/23/2019      RADIOGRAPHY: NM Sentinel Node Inj-No Rpt (Breast)  Result Date: 07/30/2019 Sulfur colloid was injected by the nuclear medicine technologist for melanoma sentinel node.   MM Breast Surgical Specimen  Result Date: 07/30/2019 CLINICAL DATA:  Status post seed  localized LEFT lumpectomy. EXAM: SPECIMEN RADIOGRAPH OF THE LEFT BREAST COMPARISON:  Previous exam(s). FINDINGS: Status post excision of the left breast. The radioactive seed and ribbon shaped biopsy marker clip are present, completely intact, and were marked for pathology. IMPRESSION: Specimen radiograph of the LEFT breast. Electronically Signed   By: Nolon Nations M.D.   On: 07/30/2019 12:43   MM LT RADIOACTIVE SEED LOC MAMMO GUIDE  Result Date: 07/29/2019 CLINICAL DATA:  69 year old with biopsy-proven grade 2 invasive mammary carcinoma involving the UPPER OUTER QUADRANT of the LEFT breast. Radioactive seed localization is performed in anticipation of lumpectomy which is scheduled for tomorrow. EXAM: MAMMOGRAPHIC GUIDED RADIOACTIVE SEED LOCALIZATION OF THE  LEFT BREAST COMPARISON:  Previous exam(s). FINDINGS: Patient presents for radioactive seed localization prior to LEFT breast lumpectomy. I met with the patient and we discussed the procedure of seed localization including benefits and alternatives. We discussed the high likelihood of a successful procedure. We discussed the risks of the procedure including infection, bleeding, tissue injury and further surgery. We discussed the low dose of radioactivity involved in the procedure. Informed, written consent was given. The usual time-out protocol was performed immediately prior to the procedure. Using mammographic guidance, sterile technique with chlorhexidine as skin antisepsis, 1% lidocaine as local anesthetic, an I-125 radioactive seed was used to localize the mass and the associated ribbon shaped tissue marker clip in the UPPER OUTER LEFT breast using a lateral approach. The follow-up mammogram images confirm the seed is appropriately positioned within the biopsied mass, immediately adjacent to the ribbon clip. The images are marked for Dr. Barry Dienes. Follow-up survey of the patient confirms the presence of the radioactive seed. Order number of I-125 seed:  [id number] Total activity: 0.246 mCi Reference Date: 07/22/2019 The patient tolerated the procedure well and was released from the [facility]. She was given instructions regarding seed removal. IMPRESSION: Radioactive seed localization of the biopsy-proven malignancy involving the UPPER OUTER QUADRANT of the LEFT breast. No apparent complications. Electronically Signed   By: Evangeline Dakin M.D.   On: 07/29/2019 15:01       IMPRESSION/PLAN: 1. Stage IA, [id number] grade 2, ER positive invasive lobular carcinoma of the left breast. Dr. Lisbeth Renshaw discusses the final pathology findings and reviews the nature of left breast disease. She is doing well since surgery and is ready to proceed wtih external radiotherapy to the breast followed by antiestrogen therapy. We discussed the risks, benefits, short, and long term effects of radiotherapy, and the patient is interested in proceeding. Dr. Lisbeth Renshaw discusses the delivery and logistics of radiotherapy and rceommends a course of 4  weeks of radiotherapy to the left breast with deep inspiration breath hold technique. Written consent is obtained and placed in the chart, a copy was provided to the patient.   In a visit lasting 45 minutes, greater than 50% of the time was spent face to face reviewing her case, as well as in preparation of, discussing, and coordinating the patient's care.  The above documentation reflects my direct findings during this shared patient visit. Please see the separate note by Dr. Lisbeth Renshaw on this date for the remainder of the patient's plan of care.    Carola Rhine, PAC

## 2019-08-19 NOTE — Patient Instructions (Signed)
            [facility]         [address], [place] [address]         [phone]         Annia Friendly, PT, CLT   After Breast Cancer Class It is recommended you attend the ABC class to be educated on lymphedema risk reduction. This class is free of charge and lasts for 1 hour. It is a 1-time class. You are scheduled for July 19th at 11:00. We will send you a link for the Webex class.   Scar massage Begin using coconut oil on your incisions to gently massage your scars and decrease the hardness.   Compression garment Continue wearing your sports bra to prevent edema.   Home exercise Program Continue doing these throughout radiation.   Follow up PT: It is recommended you return every 3 months for the first 3 years following surgery to be assessed on the SOZO machine for an L-Dex score. This helps prevent clinically significant lymphedema in 95% of patients. These follow up screens are 15 minute appointments that you are not billed for.

## 2019-08-24 DIAGNOSIS — Z170 Estrogen receptor positive status [ER+]: Secondary | ICD-10-CM | POA: Diagnosis not present

## 2019-08-24 DIAGNOSIS — C50412 Malignant neoplasm of upper-outer quadrant of left female breast: Secondary | ICD-10-CM | POA: Diagnosis not present

## 2019-08-24 DIAGNOSIS — Z510 Encounter for antineoplastic radiation therapy: Secondary | ICD-10-CM | POA: Diagnosis not present

## 2019-08-26 ENCOUNTER — Encounter: Payer: Self-pay | Admitting: *Deleted

## 2019-08-27 ENCOUNTER — Telehealth: Payer: Self-pay | Admitting: Hematology

## 2019-08-27 NOTE — Telephone Encounter (Signed)
Scheduled appt per 7/8 sch msg - mailed reminder letter with appt date and time

## 2019-08-30 ENCOUNTER — Ambulatory Visit
Admission: RE | Admit: 2019-08-30 | Discharge: 2019-08-30 | Disposition: A | Discharge status: Home / Self Care | Payer: Medicare Other | Source: Ambulatory Visit | Attending: Radiation Oncology | Admitting: Radiation Oncology

## 2019-08-30 ENCOUNTER — Other Ambulatory Visit: Payer: Self-pay

## 2019-08-30 DIAGNOSIS — C50412 Malignant neoplasm of upper-outer quadrant of left female breast: Secondary | ICD-10-CM | POA: Diagnosis not present

## 2019-08-30 DIAGNOSIS — Z170 Estrogen receptor positive status [ER+]: Secondary | ICD-10-CM | POA: Diagnosis not present

## 2019-08-30 DIAGNOSIS — Z510 Encounter for antineoplastic radiation therapy: Secondary | ICD-10-CM | POA: Diagnosis not present

## 2019-08-31 ENCOUNTER — Ambulatory Visit
Admission: RE | Admit: 2019-08-31 | Discharge: 2019-08-31 | Disposition: A | Discharge status: Home / Self Care | Payer: Medicare Other | Source: Ambulatory Visit | Attending: Radiation Oncology | Admitting: Radiation Oncology

## 2019-08-31 ENCOUNTER — Other Ambulatory Visit: Payer: Self-pay

## 2019-08-31 DIAGNOSIS — Z510 Encounter for antineoplastic radiation therapy: Secondary | ICD-10-CM | POA: Diagnosis not present

## 2019-08-31 DIAGNOSIS — Z170 Estrogen receptor positive status [ER+]: Secondary | ICD-10-CM | POA: Diagnosis not present

## 2019-08-31 DIAGNOSIS — C50412 Malignant neoplasm of upper-outer quadrant of left female breast: Secondary | ICD-10-CM | POA: Diagnosis not present

## 2019-09-01 ENCOUNTER — Ambulatory Visit
Admission: RE | Admit: 2019-09-01 | Discharge: 2019-09-01 | Disposition: A | Discharge status: Home / Self Care | Payer: Medicare Other | Source: Ambulatory Visit | Attending: Radiation Oncology | Admitting: Radiation Oncology

## 2019-09-01 ENCOUNTER — Other Ambulatory Visit: Payer: Self-pay

## 2019-09-01 DIAGNOSIS — Z170 Estrogen receptor positive status [ER+]: Secondary | ICD-10-CM | POA: Diagnosis not present

## 2019-09-01 DIAGNOSIS — Z510 Encounter for antineoplastic radiation therapy: Secondary | ICD-10-CM | POA: Diagnosis not present

## 2019-09-01 DIAGNOSIS — C50412 Malignant neoplasm of upper-outer quadrant of left female breast: Secondary | ICD-10-CM | POA: Diagnosis not present

## 2019-09-02 ENCOUNTER — Other Ambulatory Visit: Payer: Self-pay

## 2019-09-02 ENCOUNTER — Ambulatory Visit
Admission: RE | Admit: 2019-09-02 | Discharge: 2019-09-02 | Disposition: A | Discharge status: Home / Self Care | Payer: Medicare Other | Source: Ambulatory Visit | Attending: Radiation Oncology | Admitting: Radiation Oncology

## 2019-09-02 DIAGNOSIS — Z510 Encounter for antineoplastic radiation therapy: Secondary | ICD-10-CM | POA: Diagnosis not present

## 2019-09-02 DIAGNOSIS — Z170 Estrogen receptor positive status [ER+]: Secondary | ICD-10-CM | POA: Diagnosis not present

## 2019-09-02 DIAGNOSIS — C50412 Malignant neoplasm of upper-outer quadrant of left female breast: Secondary | ICD-10-CM | POA: Diagnosis not present

## 2019-09-03 ENCOUNTER — Ambulatory Visit
Admission: RE | Admit: 2019-09-03 | Discharge: 2019-09-03 | Disposition: A | Discharge status: Home / Self Care | Payer: Medicare Other | Source: Ambulatory Visit | Attending: Radiation Oncology | Admitting: Radiation Oncology

## 2019-09-03 ENCOUNTER — Other Ambulatory Visit: Payer: Self-pay

## 2019-09-03 DIAGNOSIS — Z170 Estrogen receptor positive status [ER+]: Secondary | ICD-10-CM | POA: Diagnosis not present

## 2019-09-03 DIAGNOSIS — C50412 Malignant neoplasm of upper-outer quadrant of left female breast: Secondary | ICD-10-CM | POA: Diagnosis not present

## 2019-09-03 DIAGNOSIS — Z510 Encounter for antineoplastic radiation therapy: Secondary | ICD-10-CM | POA: Diagnosis not present

## 2019-09-03 MED ORDER — ALRA NON-METALLIC DEODORANT (RAD-ONC)
1.0000 "application " | Freq: Once | TOPICAL | Status: AC
  Administered 2019-09-03: 1 via TOPICAL

## 2019-09-03 MED ORDER — SONAFINE EX EMUL
1.0000 "application " | Freq: Once | CUTANEOUS | Status: AC
  Administered 2019-09-03: 1 via TOPICAL

## 2019-09-03 NOTE — Progress Notes (Signed)
Pt here for patient teaching.  Pt given Radiation and You booklet, skin care instructions, Alra deodorant and Sonafine.  Reviewed areas of pertinence such as fatigue, hair loss, skin changes, breast tenderness and breast swelling . Pt able to give teach back of to pat skin and use unscented/gentle soap,apply Sonafine bid, avoid applying anything to skin within 4 hours of treatment, avoid wearing an under wire bra and to use an electric razor if they must shave. Pt verbalizes understanding of information given and will contact nursing with any questions or concerns.     Frankie Zito M. Torrian Canion RN, BSN             

## 2019-09-06 ENCOUNTER — Other Ambulatory Visit: Payer: Self-pay

## 2019-09-06 ENCOUNTER — Ambulatory Visit
Admission: RE | Admit: 2019-09-06 | Discharge: 2019-09-06 | Disposition: A | Discharge status: Home / Self Care | Payer: Medicare Other | Source: Ambulatory Visit | Attending: Radiation Oncology | Admitting: Radiation Oncology

## 2019-09-06 DIAGNOSIS — C50412 Malignant neoplasm of upper-outer quadrant of left female breast: Secondary | ICD-10-CM | POA: Diagnosis not present

## 2019-09-06 DIAGNOSIS — Z510 Encounter for antineoplastic radiation therapy: Secondary | ICD-10-CM | POA: Diagnosis not present

## 2019-09-06 DIAGNOSIS — Z170 Estrogen receptor positive status [ER+]: Secondary | ICD-10-CM | POA: Diagnosis not present

## 2019-09-07 ENCOUNTER — Ambulatory Visit
Admission: RE | Admit: 2019-09-07 | Discharge: 2019-09-07 | Disposition: A | Discharge status: Home / Self Care | Payer: Medicare Other | Source: Ambulatory Visit | Attending: Radiation Oncology | Admitting: Radiation Oncology

## 2019-09-07 ENCOUNTER — Other Ambulatory Visit: Payer: Self-pay

## 2019-09-07 DIAGNOSIS — C50412 Malignant neoplasm of upper-outer quadrant of left female breast: Secondary | ICD-10-CM | POA: Diagnosis not present

## 2019-09-07 DIAGNOSIS — Z510 Encounter for antineoplastic radiation therapy: Secondary | ICD-10-CM | POA: Diagnosis not present

## 2019-09-07 DIAGNOSIS — Z170 Estrogen receptor positive status [ER+]: Secondary | ICD-10-CM | POA: Diagnosis not present

## 2019-09-08 ENCOUNTER — Ambulatory Visit
Admission: RE | Admit: 2019-09-08 | Discharge: 2019-09-08 | Disposition: A | Discharge status: Home / Self Care | Payer: Medicare Other | Source: Ambulatory Visit | Attending: Radiation Oncology | Admitting: Radiation Oncology

## 2019-09-08 ENCOUNTER — Other Ambulatory Visit: Payer: Self-pay

## 2019-09-08 DIAGNOSIS — Z170 Estrogen receptor positive status [ER+]: Secondary | ICD-10-CM | POA: Diagnosis not present

## 2019-09-08 DIAGNOSIS — Z510 Encounter for antineoplastic radiation therapy: Secondary | ICD-10-CM | POA: Diagnosis not present

## 2019-09-08 DIAGNOSIS — C50412 Malignant neoplasm of upper-outer quadrant of left female breast: Secondary | ICD-10-CM | POA: Diagnosis not present

## 2019-09-09 ENCOUNTER — Other Ambulatory Visit: Payer: Self-pay

## 2019-09-09 ENCOUNTER — Ambulatory Visit
Admission: RE | Admit: 2019-09-09 | Discharge: 2019-09-09 | Disposition: A | Discharge status: Home / Self Care | Payer: Medicare Other | Source: Ambulatory Visit | Attending: Radiation Oncology | Admitting: Radiation Oncology

## 2019-09-09 DIAGNOSIS — Z510 Encounter for antineoplastic radiation therapy: Secondary | ICD-10-CM | POA: Diagnosis not present

## 2019-09-09 DIAGNOSIS — C50412 Malignant neoplasm of upper-outer quadrant of left female breast: Secondary | ICD-10-CM | POA: Diagnosis not present

## 2019-09-09 DIAGNOSIS — Z170 Estrogen receptor positive status [ER+]: Secondary | ICD-10-CM | POA: Diagnosis not present

## 2019-09-10 ENCOUNTER — Ambulatory Visit
Admission: RE | Admit: 2019-09-10 | Discharge: 2019-09-10 | Disposition: A | Discharge status: Home / Self Care | Payer: Medicare Other | Source: Ambulatory Visit | Attending: Radiation Oncology | Admitting: Radiation Oncology

## 2019-09-10 DIAGNOSIS — C50412 Malignant neoplasm of upper-outer quadrant of left female breast: Secondary | ICD-10-CM | POA: Diagnosis not present

## 2019-09-10 DIAGNOSIS — Z170 Estrogen receptor positive status [ER+]: Secondary | ICD-10-CM | POA: Diagnosis not present

## 2019-09-10 DIAGNOSIS — Z510 Encounter for antineoplastic radiation therapy: Secondary | ICD-10-CM | POA: Diagnosis not present

## 2019-09-13 ENCOUNTER — Other Ambulatory Visit: Payer: Self-pay

## 2019-09-13 ENCOUNTER — Ambulatory Visit
Admission: RE | Admit: 2019-09-13 | Discharge: 2019-09-13 | Disposition: A | Discharge status: Home / Self Care | Payer: Medicare Other | Source: Ambulatory Visit | Attending: Radiation Oncology | Admitting: Radiation Oncology

## 2019-09-13 DIAGNOSIS — Z510 Encounter for antineoplastic radiation therapy: Secondary | ICD-10-CM | POA: Diagnosis not present

## 2019-09-13 DIAGNOSIS — Z170 Estrogen receptor positive status [ER+]: Secondary | ICD-10-CM | POA: Diagnosis not present

## 2019-09-13 DIAGNOSIS — C50412 Malignant neoplasm of upper-outer quadrant of left female breast: Secondary | ICD-10-CM | POA: Diagnosis not present

## 2019-09-14 ENCOUNTER — Ambulatory Visit
Admission: RE | Admit: 2019-09-14 | Discharge: 2019-09-14 | Disposition: A | Discharge status: Home / Self Care | Payer: Medicare Other | Source: Ambulatory Visit | Attending: Radiation Oncology | Admitting: Radiation Oncology

## 2019-09-14 ENCOUNTER — Other Ambulatory Visit: Payer: Self-pay

## 2019-09-14 DIAGNOSIS — C50412 Malignant neoplasm of upper-outer quadrant of left female breast: Secondary | ICD-10-CM | POA: Diagnosis not present

## 2019-09-14 DIAGNOSIS — Z510 Encounter for antineoplastic radiation therapy: Secondary | ICD-10-CM | POA: Diagnosis not present

## 2019-09-14 DIAGNOSIS — Z170 Estrogen receptor positive status [ER+]: Secondary | ICD-10-CM | POA: Diagnosis not present

## 2019-09-15 ENCOUNTER — Other Ambulatory Visit: Payer: Self-pay

## 2019-09-15 ENCOUNTER — Ambulatory Visit
Admission: RE | Admit: 2019-09-15 | Discharge: 2019-09-15 | Disposition: A | Discharge status: Home / Self Care | Payer: Medicare Other | Source: Ambulatory Visit | Attending: Radiation Oncology | Admitting: Radiation Oncology

## 2019-09-15 DIAGNOSIS — C50412 Malignant neoplasm of upper-outer quadrant of left female breast: Secondary | ICD-10-CM | POA: Diagnosis not present

## 2019-09-15 DIAGNOSIS — Z510 Encounter for antineoplastic radiation therapy: Secondary | ICD-10-CM | POA: Diagnosis not present

## 2019-09-15 DIAGNOSIS — Z170 Estrogen receptor positive status [ER+]: Secondary | ICD-10-CM | POA: Diagnosis not present

## 2019-09-15 NOTE — Progress Notes (Signed)
[facility]   [phone] [phone]   Clinic Follow up Note   Patient Care Team: Martinique, Betty G, MD as PCP - General (Family Medicine) Clent Jacks, MD (Ophthalmology) Mauro Kaufmann, RN as Oncology Nurse Navigator Rockwell Germany, RN as Oncology Nurse Navigator Stark Klein, MD as Consulting Physician (General Surgery) Truitt Merle, MD as Consulting Physician (Hematology) Kyung Rudd, MD as Consulting Physician (Radiation Oncology)  Date of Service:  09/22/2019  CHIEF COMPLAINT: F/u of left breast   SUMMARY OF ONCOLOGIC HISTORY: Oncology History Overview Note  Cancer Staging Malignant neoplasm of upper-outer quadrant of left breast in female, estrogen receptor positive ([place]) Staging form: Breast, AJCC 8th Edition - Clinical stage from 06/15/2019: Stage IA (cT1b, cN0, cM0, G2, ER+, PR-, HER2-) - Signed by Truitt Merle, MD on 06/22/2019 - Pathologic stage from 08/19/2019: Stage IA (pT1b, pN0(sn), cM0, G2, ER+, PR-, HER2-) - Unsigned    Malignant neoplasm of upper-outer quadrant of left breast in female, estrogen receptor positive ([place])  06/14/2019 Mammogram   Diagnostic Mammogram 06/14/19  IMPRESSION: Suspicious mass in the 2 o'clock location of the LEFT breast 5 centimeters from the nipple. Mass is taller than wide and measures 0.8 x 0.7 x 0.8 centimeters.   06/15/2019 Cancer Staging   Staging form: Breast, AJCC 8th Edition - Clinical stage from 06/15/2019: Stage IA (cT1b, cN0, cM0, G2, ER+, PR-, HER2-) - Signed by Truitt Merle, MD on 06/22/2019   06/15/2019 Initial Biopsy   Diagnosis 06/15/19 Breast, left, needle core biopsy, 2 o'clock - INVASIVE MAMMARY CARCINOMA. - SEE COMMENT. Microscopic Comment The carcinoma appears grade 2. The longest span of tumor is 0.7 cm. An E-Cadherin and a breast prognostic profile will be performed and the results reported separately. Dr. Vicente Males has reviewed the case and concurs with this interpretation.      06/15/2019 Receptors her2   PROGNOSTIC INDICATORS 06/15/19 Results: IMMUNOHISTOCHEMICAL AND MORPHOMETRIC ANALYSIS PERFORMED MANUALLY The tumor cells are EQUIVOCAL for Her2 (2+). HER2 by FISH will be preformed and the results reported separately Estrogen Receptor: 100%, POSITIVE, STRONG STAINING INTENSITY Progesterone Receptor: 0%, NEGATIVE Proliferation Marker Ki67: 10% FLUORESCENCE IN-SITU HYBRIDIZATION Results: GROUP 5: HER2 **NEGATIVE**     06/22/2019 Initial Diagnosis   Malignant neoplasm of upper-outer quadrant of left breast in female, estrogen receptor positive ([place])   07/05/2019 Imaging   MRI breast  IMPRESSION: 1. Recently diagnosed left breast malignancy in the upper outer left breast measures 11 x 8 x 9 mm on MRI. 2. No other evidence of left breast malignancy. No evidence of right breast malignancy. No enlarged or abnormal lymph nodes.     07/30/2019 Surgery   LEFT BREAST LUMPECTOMY WITH RADIOACTIVE SEED AND SENTINEL LYMPH NODE BIOPSY by Dr Barry Dienes    07/30/2019 Pathology Results   FINAL MICROSCOPIC DIAGNOSIS:   A. BREAST, LEFT, LUMPECTOMY:  - Invasive lobular carcinoma, 1 cm, Nottingham grade 2 of 3.  - Margins of resection are not involved (Closest margin: 1 mm, lateral).  - Biopsy site.  - See oncology table.   B. SENTINEL LYMPH NODE, LEFT AXILLARY #1, BIOPSY:  - One lymph node, negative for carcinoma (0/1).   C. SENTINEL LYMPH NODE, LEFT AXILLARY #2, BIOPSY:  - One lymph node, negative for carcinoma (0/1).    08/30/2019 - 09/24/2019 Radiation Therapy   Adjuvant Radiation with Dr Lisbeth Renshaw        CURRENT THERAPY:  Adjuvant Radiation with Dr Lisbeth Renshaw 08/30/19-09/24/19  INTERVAL HISTORY:  Molly Fuller is  here for a follow up of left breast cancer. She presents to the clinic alone. She notes she is doing well. She notes she is tolerating Radiation well with mild skin burning and irritations. She notes she uses topical cream for this. She notes her menopause started  in at age 37 and still has residual hot flashes. She notes she sees her PCP yearly in September.     REVIEW OF SYSTEMS:   Constitutional: Denies fevers, chills or abnormal weight loss Eyes: Denies blurriness of vision Ears, nose, mouth, throat, and face: Denies mucositis or sore throat Respiratory: Denies cough, dyspnea or wheezes Cardiovascular: Denies palpitation, chest discomfort or lower extremity swelling Gastrointestinal:  Denies nausea, heartburn or change in bowel habits Skin: Denies abnormal skin rashes (+) Left breast burning and irritation from RT Lymphatics: Denies new lymphadenopathy or easy bruising Neurological:Denies numbness, tingling or new weaknesses Behavioral/Psych: Mood is stable, no new changes  All other systems were reviewed with the patient and are negative.  MEDICAL HISTORY:  Past Medical History:  Diagnosis Date  . Glaucoma    left eye  . Hyperlipidemia   . Hypertension    NO MEDS    SURGICAL HISTORY: Past Surgical History:  Procedure Laterality Date  . BREAST LUMPECTOMY WITH RADIOACTIVE SEED AND SENTINEL LYMPH NODE BIOPSY Left 07/30/2019   Procedure: LEFT BREAST LUMPECTOMY WITH RADIOACTIVE SEED AND SENTINEL LYMPH NODE BIOPSY;  Surgeon: Stark Klein, MD;  Location: [facility];  Service: General;  Laterality: Left;  REGIONAL FOR POST OP PAIN  . no prior surgery    . TUBAL LIGATION      I have reviewed the social history and family history with the patient and they are unchanged from previous note.  ALLERGIES:  is allergic to bee venom.  MEDICATIONS:  Current Outpatient Medications  Medication Sig Dispense Refill  . anastrozole (ARIMIDEX) 1 MG tablet Take 1 tablet (1 mg total) by mouth daily. 30 tablet 3  . atorvastatin (LIPITOR) 20 MG tablet Take 1 tablet (20 mg total) by mouth daily. 90 tablet 3  . brimonidine-timolol (COMBIGAN) 0.2-0.5 % ophthalmic solution Place 1 drop into both eyes every 12 (twelve) hours.    . dorzolamide (TRUSOPT) 2 %  ophthalmic solution Place 1 drop into both eyes 2 (two) times daily.   11  . EPINEPHrine 0.3 mg/0.3 mL IJ SOAJ injection     . latanoprost (XALATAN) 0.005 % ophthalmic solution Place 1 drop into both eyes at bedtime.     Marland Kitchen oxyCODONE (OXY IR/ROXICODONE) 5 MG immediate release tablet Take 1 tablet (5 mg total) by mouth every 4 (four) hours as needed for severe pain. (Patient not taking: Reported on 08/19/2019) 10 tablet 0   No current facility-administered medications for this visit.    PHYSICAL EXAMINATION: ECOG PERFORMANCE STATUS: 0 - Asymptomatic  Vitals:   09/22/19 0821  BP: (!) 144/79  Pulse: 60  Resp: 20  Temp: 97.8 F (36.6 C)   Filed Weights   09/22/19 0821  Weight: 191 lb 8 oz (86.9 kg)    GENERAL:alert, no distress and comfortable SKIN: skin color, texture, turgor are normal, no rashes or significant lesions EYES: normal, Conjunctiva are pink and non-injected, sclera clear  NECK: supple, thyroid normal size, non-tender, without nodularity LYMPH:  no palpable lymphadenopathy in the cervical, axillary  LUNGS: clear to auscultation and percussion with normal breathing effort HEART: regular rate & rhythm and no murmurs and no lower extremity edema ABDOMEN:abdomen soft, non-tender and normal bowel sounds Musculoskeletal:no cyanosis  of digits and no clubbing  NEURO: alert & oriented x 3 with fluent speech, no focal motor/sensory deficits BREAST: s/p left lumpectomy: Surgical incision healed well with scar tissue or seroma at incision (+) Mild to moderate Skin erythema and inflammation of left breast. Right Breast exam benign.   LABORATORY DATA:  I have reviewed the data as listed CBC Latest Ref Rng & Units 07/22/2019 06/23/2019 08/10/2018  WBC 4.0 - 10.5 K/uL 4.3 4.1 4.9  Hemoglobin 12.0 - 15.0 g/dL 14.4 13.6 13.9  Hematocrit 36 - 46 % 45.0 41.0 40.7  Platelets 150 - 400 K/uL 216 174 209.0     CMP Latest Ref Rng & Units 07/22/2019 06/23/2019 08/10/2018  Glucose 70 - 99 mg/dL 84  80 -  BUN 8 - 23 mg/dL 11 12 -  Creatinine 0.44 - 1.00 mg/dL 0.71 0.77 -  Sodium 135 - 145 mmol/L 139 142 -  Potassium 3.5 - 5.1 mmol/L 4.1 4.0 -  Chloride 98 - 111 mmol/L 105 107 -  CO2 22 - 32 mmol/L 24 27 -  Calcium 8.9 - 10.3 mg/dL 9.1 9.0 -  Total Protein 6.5 - 8.1 g/dL - 7.0 6.7  Total Bilirubin 0.3 - 1.2 mg/dL - 1.0 0.7  Alkaline Phos 38 - 126 U/L - 64 77  AST 15 - 41 U/L - 22 19  ALT 0 - 44 U/L - 14 17      RADIOGRAPHIC STUDIES: I have personally reviewed the radiological images as listed and agreed with the findings in the report. No results found.   ASSESSMENT & PLAN:  Molly Fuller is a 69 y.o. female with    1. Malignant neoplasm of upper-outer quadrant of left breast, lobular carcinoma, Stage IA, p(T1bN0M0), ER+, PR-/HER2-, Grade II  -She was diagnosed in 05/2019. She had a very small left breast mass and biopsy showed invasive lobular carcinoma, ER strongly positive. -She underwent left lumpectomy with Dr Barry Dienes on 07/30/19. Path showed her 1cm mass was completely resection with Clear margins, node negative.  -Given her early stage small tumor, chemotherapy is not recommended.  -To reduce her risk of local cancer recurrence she is undergoing adjuvant radiation with Dr Lisbeth Renshaw since 08/30/19. Plan to complete 09/24/19. Tolerating well with skin irritation.  -Given the strong ER expression in her tumor and her postmenopausal status, I recommend adjuvant endocrine therapy with aromatase inhibitor Anastrozole or Tamoxifen for a total of 5-10 years to reduce the risk of cancer recurrence. Due to her lobular histology, I will recommend 10 years therapy if she tolerates well   -The potential benefit and side effects, which includes but not limited to, hot flash, skin and vaginal dryness, metabolic changes ( increased blood glucose, cholesterol, weight, etc.), slightly in increased risk of cardiovascular disease, cataracts, muscular and joint discomfort, osteopenia and osteoporosis,  etc, were discussed with her in great details. I will call in medication based on her bone density. Plan to start in late 09/2019.  -She will proceed with survivorship clinic with NP Lacie in 3 months. F/u with me in 6 months.     2. Bone Health  -Her last DEXA was in 2015. I recommend repeating for new baseline as she may proceed with AI that can weaken her bone. She is agreeable.  -I recommend she start OTC Calcium and Vit D, given her prior history of Osteopenia.     PLAN:  -Continue Radiation until 09/24/19  -DEXA scan within in 3-4 weeks. I will call her with results  -  I called in anastrozole today she will start in about 3-4 weeks  -Survivorship clinic in 3 months  -Lab and f/u in 6 months    No problem-specific Assessment & Plan notes found for this encounter.   Orders Placed This Encounter  Procedures  . DG Bone Density    Standing Status:   Future    Standing Expiration Date:   09/21/2020    Order Specific Question:   Reason for Exam (SYMPTOM  OR DIAGNOSIS REQUIRED)    Answer:   screening    Order Specific Question:   Preferred imaging location?    Answer:   [facility]   All questions were answered. The patient knows to call the clinic with any problems, questions or concerns. No barriers to learning was detected. The total time spent in the appointment was 30 minutes.     Truitt Merle, MD 09/22/2019   I, Joslyn Devon, am acting as scribe for Truitt Merle, MD.   I have reviewed the above documentation for accuracy and completeness, and I agree with the above.

## 2019-09-16 ENCOUNTER — Ambulatory Visit
Admission: RE | Admit: 2019-09-16 | Discharge: 2019-09-16 | Disposition: A | Discharge status: Home / Self Care | Payer: Medicare Other | Source: Ambulatory Visit | Attending: Radiation Oncology | Admitting: Radiation Oncology

## 2019-09-16 ENCOUNTER — Other Ambulatory Visit: Payer: Self-pay

## 2019-09-16 DIAGNOSIS — C50412 Malignant neoplasm of upper-outer quadrant of left female breast: Secondary | ICD-10-CM | POA: Diagnosis not present

## 2019-09-16 DIAGNOSIS — Z170 Estrogen receptor positive status [ER+]: Secondary | ICD-10-CM | POA: Diagnosis not present

## 2019-09-16 DIAGNOSIS — Z510 Encounter for antineoplastic radiation therapy: Secondary | ICD-10-CM | POA: Diagnosis not present

## 2019-09-17 ENCOUNTER — Ambulatory Visit: Payer: Medicare Other | Admitting: Radiation Oncology

## 2019-09-17 ENCOUNTER — Ambulatory Visit
Admission: RE | Admit: 2019-09-17 | Discharge: 2019-09-17 | Disposition: A | Discharge status: Home / Self Care | Payer: Medicare Other | Source: Ambulatory Visit | Attending: Radiation Oncology | Admitting: Radiation Oncology

## 2019-09-17 ENCOUNTER — Other Ambulatory Visit: Payer: Self-pay

## 2019-09-17 DIAGNOSIS — C50412 Malignant neoplasm of upper-outer quadrant of left female breast: Secondary | ICD-10-CM | POA: Diagnosis not present

## 2019-09-17 DIAGNOSIS — Z170 Estrogen receptor positive status [ER+]: Secondary | ICD-10-CM | POA: Diagnosis not present

## 2019-09-17 DIAGNOSIS — Z510 Encounter for antineoplastic radiation therapy: Secondary | ICD-10-CM | POA: Diagnosis not present

## 2019-09-20 ENCOUNTER — Other Ambulatory Visit: Payer: Self-pay

## 2019-09-20 ENCOUNTER — Ambulatory Visit
Admission: RE | Admit: 2019-09-20 | Discharge: 2019-09-20 | Disposition: A | Discharge status: Home / Self Care | Payer: Medicare Other | Source: Ambulatory Visit | Attending: Radiation Oncology | Admitting: Radiation Oncology

## 2019-09-20 DIAGNOSIS — Z170 Estrogen receptor positive status [ER+]: Secondary | ICD-10-CM | POA: Diagnosis not present

## 2019-09-20 DIAGNOSIS — Z510 Encounter for antineoplastic radiation therapy: Secondary | ICD-10-CM | POA: Diagnosis not present

## 2019-09-20 DIAGNOSIS — C50412 Malignant neoplasm of upper-outer quadrant of left female breast: Secondary | ICD-10-CM | POA: Insufficient documentation

## 2019-09-21 ENCOUNTER — Ambulatory Visit
Admission: RE | Admit: 2019-09-21 | Discharge: 2019-09-21 | Disposition: A | Discharge status: Home / Self Care | Payer: Medicare Other | Source: Ambulatory Visit | Attending: Radiation Oncology | Admitting: Radiation Oncology

## 2019-09-21 ENCOUNTER — Other Ambulatory Visit: Payer: Self-pay

## 2019-09-21 DIAGNOSIS — Z510 Encounter for antineoplastic radiation therapy: Secondary | ICD-10-CM | POA: Diagnosis not present

## 2019-09-21 DIAGNOSIS — Z170 Estrogen receptor positive status [ER+]: Secondary | ICD-10-CM | POA: Diagnosis not present

## 2019-09-21 DIAGNOSIS — C50412 Malignant neoplasm of upper-outer quadrant of left female breast: Secondary | ICD-10-CM | POA: Diagnosis not present

## 2019-09-22 ENCOUNTER — Inpatient Hospital Stay: Payer: Medicare Other | Attending: Hematology | Admitting: Hematology

## 2019-09-22 ENCOUNTER — Ambulatory Visit
Admission: RE | Admit: 2019-09-22 | Discharge: 2019-09-22 | Disposition: A | Discharge status: Home / Self Care | Payer: Medicare Other | Source: Ambulatory Visit | Attending: Radiation Oncology | Admitting: Radiation Oncology

## 2019-09-22 ENCOUNTER — Other Ambulatory Visit: Payer: Self-pay

## 2019-09-22 ENCOUNTER — Encounter: Payer: Self-pay | Admitting: Hematology

## 2019-09-22 VITALS — BP 144/79 | HR 60 | Temp 97.8°F | Resp 20 | Ht 66.0 in | Wt 191.5 lb

## 2019-09-22 DIAGNOSIS — C50412 Malignant neoplasm of upper-outer quadrant of left female breast: Secondary | ICD-10-CM | POA: Diagnosis not present

## 2019-09-22 DIAGNOSIS — E2839 Other primary ovarian failure: Secondary | ICD-10-CM

## 2019-09-22 DIAGNOSIS — Z170 Estrogen receptor positive status [ER+]: Secondary | ICD-10-CM

## 2019-09-22 DIAGNOSIS — M8580 Other specified disorders of bone density and structure, unspecified site: Secondary | ICD-10-CM | POA: Diagnosis not present

## 2019-09-22 DIAGNOSIS — Z510 Encounter for antineoplastic radiation therapy: Secondary | ICD-10-CM | POA: Diagnosis not present

## 2019-09-22 MED ORDER — ANASTROZOLE 1 MG PO TABS: 1.0000 mg | ORAL_TABLET | Freq: Every day | ORAL | 3 refills | Status: DC

## 2019-09-23 ENCOUNTER — Other Ambulatory Visit: Payer: Self-pay

## 2019-09-23 ENCOUNTER — Ambulatory Visit
Admission: RE | Admit: 2019-09-23 | Discharge: 2019-09-23 | Disposition: A | Discharge status: Home / Self Care | Payer: Medicare Other | Source: Ambulatory Visit | Attending: Radiation Oncology | Admitting: Radiation Oncology

## 2019-09-23 ENCOUNTER — Encounter: Payer: Self-pay | Admitting: *Deleted

## 2019-09-23 ENCOUNTER — Telehealth: Payer: Self-pay | Admitting: Hematology

## 2019-09-23 DIAGNOSIS — Z510 Encounter for antineoplastic radiation therapy: Secondary | ICD-10-CM | POA: Diagnosis not present

## 2019-09-23 DIAGNOSIS — Z170 Estrogen receptor positive status [ER+]: Secondary | ICD-10-CM | POA: Diagnosis not present

## 2019-09-23 DIAGNOSIS — C50412 Malignant neoplasm of upper-outer quadrant of left female breast: Secondary | ICD-10-CM | POA: Diagnosis not present

## 2019-09-23 NOTE — Telephone Encounter (Signed)
Scheduled per 8/4 los. Pt is aware of appt time and date.

## 2019-09-24 ENCOUNTER — Ambulatory Visit
Admission: RE | Admit: 2019-09-24 | Discharge: 2019-09-24 | Disposition: A | Discharge status: Home / Self Care | Payer: Medicare Other | Source: Ambulatory Visit | Attending: Radiation Oncology | Admitting: Radiation Oncology

## 2019-09-24 ENCOUNTER — Encounter: Payer: Self-pay | Admitting: Radiation Oncology

## 2019-09-24 ENCOUNTER — Other Ambulatory Visit: Payer: Self-pay

## 2019-09-24 DIAGNOSIS — C50412 Malignant neoplasm of upper-outer quadrant of left female breast: Secondary | ICD-10-CM | POA: Diagnosis not present

## 2019-09-24 DIAGNOSIS — Z170 Estrogen receptor positive status [ER+]: Secondary | ICD-10-CM | POA: Diagnosis not present

## 2019-09-24 DIAGNOSIS — Z510 Encounter for antineoplastic radiation therapy: Secondary | ICD-10-CM | POA: Diagnosis not present

## 2019-10-01 ENCOUNTER — Other Ambulatory Visit: Payer: Self-pay

## 2019-10-01 ENCOUNTER — Ambulatory Visit
Admission: RE | Admit: 2019-10-01 | Discharge: 2019-10-01 | Disposition: A | Discharge status: Home / Self Care | Payer: Medicare Other | Source: Ambulatory Visit | Attending: Hematology | Admitting: Hematology

## 2019-10-01 DIAGNOSIS — Z780 Asymptomatic menopausal state: Secondary | ICD-10-CM | POA: Diagnosis not present

## 2019-10-01 DIAGNOSIS — E2839 Other primary ovarian failure: Secondary | ICD-10-CM

## 2019-10-03 ENCOUNTER — Encounter: Payer: Self-pay | Admitting: Hematology

## 2019-10-10 NOTE — Progress Notes (Signed)
  Radiation Oncology         [phone] ________________________________  Name: Molly Fuller MRN: [id number]  Date: 09/24/2019  DOB: Jul 14, 1950  End of Treatment Note  Diagnosis:   left-sided breast cancer     Indication for treatment:  Curative       Radiation treatment dates:   08/30/19 - 09/24/19  Site/dose:   The patient initially received a dose of 42.56 Gy in 16 fractions to the breast using whole-breast tangent fields. This was delivered using a 3-D conformal technique. The patient then received a boost to the seroma. This delivered an additional 8 Gy in 45fractions using a 3 field photon technique due to the depth of the seroma. The total dose was 50.56 Gy.  Narrative: The patient tolerated radiation treatment relatively well.   The patient had some expected skin irritation as she progressed during treatment.    Plan: The patient has completed radiation treatment. The patient will return to radiation oncology clinic for routine followup in one month. I advised the patient to call or return sooner if they have any questions or concerns related to their recovery or treatment. ________________________________  Jodelle Gross, M.D., Ph.D.

## 2019-10-27 ENCOUNTER — Telehealth: Payer: Self-pay | Admitting: Radiation Oncology

## 2019-10-27 NOTE — Telephone Encounter (Signed)
  Radiation Oncology         [phone] ________________________________  Name: Molly Fuller MRN: [id number]  Date of Service: 10/28/19  DOB: [birth year]  Post Treatment Telephone Note  Diagnosis:   Stage IA, pT1bN0M0 grade 2, ER positive invasive lobular carcinoma of the left breast  Interval Since Last Radiation:  5 weeks   08/30/19 - 09/24/19: The patient initially received a dose of 42.56 Gy in 16 fractions to the breast using whole-breast tangent fields. This was delivered using a 3-D conformal technique. The patient then received a boost to the seroma. This delivered an additional 8 Gy in 4 fractions using a 3 field photon technique due to the depth of the seroma. The total dose was 50.56 Gy.  Narrative:  The patient was contacted today for routine follow-up. During treatment she did very well with radiotherapy and did not have significant desquamation.    Impression/Plan: 1. Stage IA, pT1bN0M0 grade 2, ER positive invasive lobular carcinoma of the left breast. I was unable to reach the patient but left a voicemail. On the  Message I discussed that we would be happy to continue to follow her as needed, but she will also continue to follow up with Dr. Burr Medico in medical oncology. She was counseled on skin care as well as measures to avoid sun exposure to this area.      Carola Rhine, PAC

## 2019-11-01 ENCOUNTER — Ambulatory Visit: Payer: Medicare Other | Attending: General Surgery

## 2019-11-01 ENCOUNTER — Other Ambulatory Visit: Payer: Self-pay

## 2019-11-01 DIAGNOSIS — C50412 Malignant neoplasm of upper-outer quadrant of left female breast: Secondary | ICD-10-CM | POA: Insufficient documentation

## 2019-11-01 DIAGNOSIS — Z170 Estrogen receptor positive status [ER+]: Secondary | ICD-10-CM | POA: Insufficient documentation

## 2019-11-01 NOTE — Therapy (Signed)
[place], [place], [address] Phone: [phone]   Fax:  [phone]  Physical Therapy Treatment  Patient Details  Name: Molly Fuller MRN: [id number] Date of Birth: 05-31-1950 Referring Provider (PT): Dr. Stark Klein   Encounter Date: 11/01/2019   PT End of Session - 11/01/19 0854    Visit Number 2   # unchanged due to screen only   Number of Visits 2    Date for PT Re-Evaluation 08/18/19    PT Start Time 0847    PT Stop Time 0854    PT Time Calculation (min) 7 min    Activity Tolerance Patient tolerated treatment well    Behavior During Therapy [facility] for tasks assessed/performed           Past Medical History:  Diagnosis Date  . Glaucoma    left eye  . Hyperlipidemia   . Hypertension    NO MEDS    Past Surgical History:  Procedure Laterality Date  . BREAST LUMPECTOMY WITH RADIOACTIVE SEED AND SENTINEL LYMPH NODE BIOPSY Left 07/30/2019   Procedure: LEFT BREAST LUMPECTOMY WITH RADIOACTIVE SEED AND SENTINEL LYMPH NODE BIOPSY;  Surgeon: Stark Klein, MD;  Location: [facility];  Service: General;  Laterality: Left;  REGIONAL FOR POST OP PAIN  . no prior surgery    . TUBAL LIGATION      There were no vitals filed for this visit.   Subjective Assessment - 11/01/19 0849    Subjective Pt returns for 3 month L-Dex screen.                  L-DEX FLOWSHEETS - 11/01/19 0800      L-DEX LYMPHEDEMA SCREENING   BASELINE SCORE (UNILATERAL) -1.6    L-DEX SCORE (UNILATERAL) 3.9    VALUE CHANGE (UNILAT) 5.5                                  PT Long Term Goals - 08/19/19 1159      PT LONG TERM GOAL #1   Title Patient will demonstrate she has regained full shoulder ROM and function post operatively compared to baselines.    Time 8    Period Weeks    Status Achieved                 Plan - 11/01/19 0854    Clinical Impression Statement Pt returns for 3  month L-Dex screen. Her score was within normal range with a 5.5 change from baseline. Instructed her to return sooner if she notes any heaviness or achiness in her UE as she is getting closer to the 6.5 limit of having subclinical lymphedema, but if no changes then plan to see her again for next screen in 3 months. She was agreeable to this.    PT Next Visit Plan Continue L-Dex screens every 3 months    Consulted and Agree with Plan of Care Patient           Patient will benefit from skilled therapeutic intervention in order to improve the following deficits and impairments:     Visit Diagnosis: Malignant neoplasm of upper-outer quadrant of left breast in female, estrogen receptor positive ([place])     Problem List Patient Active Problem List   Diagnosis Date Noted  . Malignant neoplasm of upper-outer quadrant of left breast in female, estrogen receptor positive ([place]) 06/22/2019  . Constipation 12/30/2017  .  Hyperlipidemia type III 12/30/2017  . Class 1 obesity with serious comorbidity and body mass index (BMI) of 30.0 to 30.9 in adult 01/09/2015  . Routine general medical examination at a health care facility 09/05/2011  . Glaucoma 09/05/2011  . Hyperlipidemia 12/15/2006    Otelia Limes, PTA 11/01/2019, 8:56 AM  [place], [place], [address] Phone: [phone]   Fax:  [phone]  Name: NIKITA SURMAN MRN: [id number] Date of Birth: 1950/07/09

## 2019-11-02 ENCOUNTER — Ambulatory Visit (INDEPENDENT_AMBULATORY_CARE_PROVIDER_SITE_OTHER): Payer: Medicare Other | Admitting: Family Medicine

## 2019-11-02 ENCOUNTER — Encounter: Payer: Self-pay | Admitting: Family Medicine

## 2019-11-02 VITALS — BP 130/70 | HR 73 | Temp 98.3°F | Resp 16 | Ht 66.0 in | Wt 187.0 lb

## 2019-11-02 DIAGNOSIS — Z6830 Body mass index (BMI) 30.0-30.9, adult: Secondary | ICD-10-CM

## 2019-11-02 DIAGNOSIS — E785 Hyperlipidemia, unspecified: Secondary | ICD-10-CM

## 2019-11-02 DIAGNOSIS — Z0000 Encounter for general adult medical examination without abnormal findings: Secondary | ICD-10-CM

## 2019-11-02 DIAGNOSIS — E6609 Other obesity due to excess calories: Secondary | ICD-10-CM

## 2019-11-02 DIAGNOSIS — I5032 Chronic diastolic (congestive) heart failure: Secondary | ICD-10-CM | POA: Diagnosis not present

## 2019-11-02 DIAGNOSIS — C50412 Malignant neoplasm of upper-outer quadrant of left female breast: Secondary | ICD-10-CM

## 2019-11-02 DIAGNOSIS — Z170 Estrogen receptor positive status [ER+]: Secondary | ICD-10-CM

## 2019-11-02 LAB — LIPID PANEL
Cholesterol: 199 mg/dL (ref <200)
HDL: 69 mg/dL (ref >=50)
LDL Cholesterol (Calc): 111 mg/dL (calc) — ABNORMAL HIGH
Non-HDL Cholesterol (Calc): 130 mg/dL (calc) — ABNORMAL HIGH (ref <130)
Total CHOL/HDL Ratio: 2.9 (calc) (ref <5.0)
Triglycerides: 89 mg/dL (ref <150)

## 2019-11-02 NOTE — Progress Notes (Signed)
HPI: Molly Fuller is a 69 y.o. female, who is here today for chronic disease management and AWV.   Molly Fuller was last seen on 10/20/18.  Molly Fuller lives with her husband.. Independent ADL's and IADL's. No falls in the past year and denies depression symptoms.  Functional Status Survey: Is the patient deaf or have difficulty hearing?: No Does the patient have difficulty seeing, even when wearing glasses/contacts?: No Does the patient have difficulty concentrating, remembering, or making decisions?: No Does the patient have difficulty walking or climbing stairs?: No Does the patient have difficulty dressing or bathing?: No Does the patient have difficulty doing errands alone such as visiting a doctor's office or shopping?: No  Fall Risk  11/02/2019 08/17/2018 01/02/2018 03/29/2016 02/15/2016  Falls in the past year? 0 0 0 No No  Comment - - Emmi Telephone Survey: data to providers prior to load - -  Number falls in past yr: 0 0 - - -  Injury with Fall? 0 0 - - -  Follow up Education provided Education provided - - -   Providers Molly Fuller  sees regularly: Eye care provider: She sees provider regularly to follow on glaucoma. Oncologist, Dr Burr Medico.  Depression screen PHQ 2/9 11/02/2019  Decreased Interest 0  Down, Depressed, Hopeless 0  PHQ - 2 Score 0   Immunization History  Administered Date(s) Administered  . PFIZER SARS-COV-2 Vaccination 03/15/2019, 04/05/2019  . Td 02/18/2002, 02/19/2008    Mini-Cog - 11/02/19 1036    Normal clock drawing test? yes    How many words correct? 3           Hearing Screening   125Hz  250Hz  500Hz  1000Hz  2000Hz  3000Hz  4000Hz  6000Hz  8000Hz   Right ear:           Left ear:           Vision Screening Comments: Refused. Last eye exam 07/2019.  Since her last visit Molly Fuller has been dx'ed with breast cancer. S/P lumpectomy. Radiation completed. Molly Fuller is following with oncologist q 3 months.  Molly Fuller has not started Anastrozole, afraid of side effects. Molly Fuller wonders  if Molly Fuller can work on her diet to decrease estrogen "naturally."  HLD: Molly Fuller is on Atorvastatin 20 mg daily. Tolerating medication well.  Lab Results  Component Value Date   CHOL 188 08/10/2018   HDL 61.00 08/10/2018   LDLCALC 112 (H) 08/10/2018   LDLDIRECT 262.7 12/29/2012   TRIG 74.0 08/10/2018   CHOLHDL 3 08/10/2018   Molly Fuller is walking every morning 20 min for at least 5 times per week. Molly Fuller is trying to eat healthier.  Diastolic dysfunction: Negative for CP,SOB,orthopnea,or PND. Echo on 10/22/18: LVEF > 65% and grade I diastolic dysfunction.  Review of Systems  Constitutional: Negative for activity change, appetite change, fatigue and fever.  HENT: Negative for mouth sores, nosebleeds and sore throat.   Eyes: Negative for redness and visual disturbance.  Respiratory: Negative for cough and wheezing.   Cardiovascular: Negative for palpitations and leg swelling.  Gastrointestinal: Negative for abdominal pain, nausea and vomiting.       Negative for changes in bowel habits.  Genitourinary: Negative for decreased urine volume, dysuria and hematuria.  Musculoskeletal: Negative for gait problem and myalgias.  Neurological: Negative for syncope, weakness and headaches.  Psychiatric/Behavioral: Negative for confusion. The patient is nervous/anxious.   Rest of ROS, see pertinent positives sand negatives in HPI  Current Outpatient Medications on File Prior to Visit  Medication Sig Dispense Refill  . anastrozole (ARIMIDEX)  1 MG tablet Take 1 tablet (1 mg total) by mouth daily. 30 tablet 3  . brimonidine-timolol (COMBIGAN) 0.2-0.5 % ophthalmic solution Place 1 drop into both eyes every 12 (twelve) hours.    . dorzolamide (TRUSOPT) 2 % ophthalmic solution Place 1 drop into both eyes 2 (two) times daily.   11  . EPINEPHrine 0.3 mg/0.3 mL IJ SOAJ injection     . latanoprost (XALATAN) 0.005 % ophthalmic solution Place 1 drop into both eyes at bedtime.      No current facility-administered  medications on file prior to visit.   Past Medical History:  Diagnosis Date  . Glaucoma    left eye  . Hyperlipidemia   . Hypertension    NO MEDS   Allergies  Allergen Reactions  . Bee Venom Swelling    Social History   Socioeconomic History  . Marital status: Married    Spouse name: Not on file  . Number of children: 2  . Years of education: Not on file  . Highest education level: Not on file  Occupational History  . Occupation: retired Associate Professor   Tobacco Use  . Smoking status: Never Smoker  . Smokeless tobacco: Never Used  Substance and Sexual Activity  . Alcohol use: No  . Drug use: No  . Sexual activity: Not on file  Other Topics Concern  . Not on file  Social History Narrative  . Not on file   Social Determinants of Health   Financial Resource Strain:   . Difficulty of Paying Living Expenses: Not on file  Food Insecurity:   . Worried About Charity fundraiser in the Last Year: Not on file  . Ran Out of Food in the Last Year: Not on file  Transportation Needs:   . Lack of Transportation (Medical): Not on file  . Lack of Transportation (Non-Medical): Not on file  Physical Activity:   . Days of Exercise per Week: Not on file  . Minutes of Exercise per Session: Not on file  Stress:   . Feeling of Stress : Not on file  Social Connections:   . Frequency of Communication with Friends and Family: Not on file  . Frequency of Social Gatherings with Friends and Family: Not on file  . Attends Religious Services: Not on file  . Active Member of Clubs or Organizations: Not on file  . Attends Archivist Meetings: Not on file  . Marital Status: Not on file    Vitals:   11/02/19 0855  BP: 130/70  Pulse: 73  Resp: 16  Temp: 98.3 F (36.8 C)  SpO2: 97%   Wt Readings from Last 3 Encounters:  11/02/19 187 lb (84.8 kg)  09/22/19 191 lb 8 oz (86.9 kg)  08/19/19 188 lb 3.2 oz (85.4 kg)    Body mass index is 30.18 kg/m.  Physical  Exam Vitals and nursing note reviewed.  Constitutional:      General: Molly Fuller is not in acute distress.    Appearance: Molly Fuller is well-developed.  HENT:     Head: Normocephalic and atraumatic.     Mouth/Throat:     Mouth: Mucous membranes are moist.     Pharynx: Oropharynx is clear.  Eyes:     Conjunctiva/sclera: Conjunctivae normal.     Pupils: Pupils are equal, round, and reactive to light.  Cardiovascular:     Rate and Rhythm: Normal rate and regular rhythm.     Pulses:  Dorsalis pedis pulses are 2+ on the right side and 2+ on the left side.     Heart sounds: No murmur heard.   Pulmonary:     Effort: Pulmonary effort is normal. No respiratory distress.     Breath sounds: Normal breath sounds.  Abdominal:     Palpations: Abdomen is soft. There is no hepatomegaly or mass.     Tenderness: There is no abdominal tenderness.  Lymphadenopathy:     Cervical: No cervical adenopathy.  Skin:    General: Skin is warm.     Findings: No erythema or rash.  Neurological:     General: No focal deficit present.     Mental Status: Molly Fuller is alert and oriented to person, place, and time.     Cranial Nerves: No cranial nerve deficit.     Gait: Gait normal.     Deep Tendon Reflexes:     Reflex Scores:      Patellar reflexes are 2+ on the right side and 2+ on the left side. Psychiatric:        Mood and Affect: Mood and affect normal.     Comments: Well groomed, good eye contact.   ASSESSMENT AND PLAN:  Molly Fuller was seen today for chronic disease management and AWV.  Orders Placed This Encounter  Procedures  . Lipid panel   Lab Results  Component Value Date   CHOL 199 11/02/2019   HDL 69 11/02/2019   LDLCALC 111 (H) 11/02/2019   LDLDIRECT 262.7 12/29/2012   TRIG 89 11/02/2019   CHOLHDL 2.9 11/02/2019   Medicare annual wellness visit, subsequent We discussed the importance of staying active, physically and mentally, as well as the benefits of a healthy/balnace  diet. Low impact exercise that involve stretching and strengthing are ideal. Vaccines: Declined vaccination. We discussed preventive screening for the next 5-10 years, summery of recommendations given in AVS. Colonoscopy: 05/2012. Mammogram: 06/14/19. DEXA: 10/01/19.  Fall prevention.  Advance directives and end of life discussed, Molly Fuller does not have POA or living will, package given today.   Malignant neoplasm of upper-outer quadrant of left breast in female, estrogen receptor positive ([place]) Recommend starting Anastrozole as Molly Fuller was instructed to do so. We discussed some side effects. Following with oncologist.  Hyperlipidemia Continue Atorvastatin 20 mg daily. Statin dose will be adjusted according to lab results. Continue annual follow up.  Chronic heart failure with preserved ejection fraction (HCC) Asymptomatic. Continue low salt diet.  Class 1 obesity due to excess calories with serious comorbidity and body mass index (BMI) of 30.0 to 30.9 in adult Wt has been stable. Encouraged to continue a healthy life style.  Return in about 1 year (around 11/01/2020) for f/u and AWV.   Braeton Wolgamott G. Martinique, MD  [facility] [place] office.   Molly Fuller , Thank you for taking time to come for your Medicare Wellness Visit. I appreciate your ongoing commitment to your health goals. Please review the following plan we discussed and let me know if I can assist you in the future.   These are the goals we discussed: Goals    .  Acknowledge receipt of Advanced Directive package     Review package and consider signing it, notarized.       This is a list of the screening recommended for you and due dates:  Health Maintenance  Topic Date Due  . Mammogram  05/27/2021  . Colon Cancer Screening  05/28/2022  . DEXA scan (bone density  measurement)  Completed  . COVID-19 Vaccine  Completed  . Flu Shot  Discontinued  . Tetanus Vaccine  Discontinued  .  Hepatitis C: One time screening  is recommended by Center for Disease Control  (CDC) for  adults born from [birth year] through 1967.   Discontinued  . Pneumonia vaccines  Discontinued   A few tips:  -As we age balance is not as good as it was, so there is a higher risks for falls. Please remove small rugs and furniture that is "in your way" and could increase the risk of falls. Stretching exercises may help with fall prevention: Yoga and Tai Chi are some examples. Low impact exercise is better, so you are not very achy the next day.  -Sun screen and avoidance of direct sun light recommended. Caution with dehydration, if working outdoors be sure to drink enough fluids.  - Some medications are not safe as we age, increases the risk of side effects and can potentially interact with other medication you are also taken;  including some of over the counter medications. Be sure to let me know when you start a new medication even if it is a dietary/vitamin supplement.   -Healthy diet low in red meet/animal fat and sugar + regular physical activity is recommended.     A few things to remember from today's visit:   Hyperlipidemia, unspecified hyperlipidemia type - Plan: Lipid panel  Medicare annual wellness visit, subsequent  If you need refills please call your pharmacy. Do not use My Chart to request refills or for acute issues that need immediate attention.    Please be sure medication list is accurate. If a new problem present, please set up appointment sooner than planned today.

## 2019-11-02 NOTE — Assessment & Plan Note (Addendum)
Continue Atorvastatin 20 mg daily. Statin dose will be adjusted according to lab results. Continue annual follow up.

## 2019-11-02 NOTE — Patient Instructions (Signed)
  Molly Fuller , Thank you for taking time to come for your Medicare Wellness Visit. I appreciate your ongoing commitment to your health goals. Please review the following plan we discussed and let me know if I can assist you in the future.   These are the goals we discussed: Goals    .  Acknowledge receipt of Advanced Directive package     Review package and consider signing it, notarized.       This is a list of the screening recommended for you and due dates:  Health Maintenance  Topic Date Due  . Mammogram  05/27/2021  . Colon Cancer Screening  05/28/2022  . DEXA scan (bone density measurement)  Completed  . COVID-19 Vaccine  Completed  . Flu Shot  Discontinued  . Tetanus Vaccine  Discontinued  .  Hepatitis C: One time screening is recommended by Center for Disease Control  (CDC) for  adults born from [birth year] through 1967.   Discontinued  . Pneumonia vaccines  Discontinued   A few tips:  -As we age balance is not as good as it was, so there is a higher risks for falls. Please remove small rugs and furniture that is "in your way" and could increase the risk of falls. Stretching exercises may help with fall prevention: Yoga and Tai Chi are some examples. Low impact exercise is better, so you are not very achy the next day.  -Sun screen and avoidance of direct sun light recommended. Caution with dehydration, if working outdoors be sure to drink enough fluids.  - Some medications are not safe as we age, increases the risk of side effects and can potentially interact with other medication you are also taken;  including some of over the counter medications. Be sure to let me know when you start a new medication even if it is a dietary/vitamin supplement.   -Healthy diet low in red meet/animal fat and sugar + regular physical activity is recommended.     A few things to remember from today's visit:   Hyperlipidemia, unspecified hyperlipidemia type - Plan: Lipid panel  Medicare annual  wellness visit, subsequent  If you need refills please call your pharmacy. Do not use My Chart to request refills or for acute issues that need immediate attention.    Please be sure medication list is accurate. If a new problem present, please set up appointment sooner than planned today.

## 2019-11-04 MED ORDER — ATORVASTATIN CALCIUM 20 MG PO TABS: 20.0000 mg | ORAL_TABLET | Freq: Every day | ORAL | 3 refills | Status: DC

## 2019-12-13 ENCOUNTER — Other Ambulatory Visit: Payer: Self-pay | Admitting: Hematology

## 2019-12-13 DIAGNOSIS — Z170 Estrogen receptor positive status [ER+]: Secondary | ICD-10-CM

## 2019-12-13 DIAGNOSIS — C50412 Malignant neoplasm of upper-outer quadrant of left female breast: Secondary | ICD-10-CM

## 2019-12-23 ENCOUNTER — Encounter: Payer: Medicare Other | Admitting: Nurse Practitioner

## 2020-03-22 NOTE — Progress Notes (Signed)
Molly Fuller   [phone] [phone]   Clinic Follow up Note   Patient Care Team: Martinique, Betty G, MD as PCP - General (Family Medicine) Clent Jacks, MD (Ophthalmology) Mauro Kaufmann, RN as Oncology Nurse Navigator Rockwell Germany, RN as Oncology Nurse Navigator Stark Klein, MD as Consulting Physician (General Surgery) Truitt Merle, MD as Consulting Physician (Hematology) Kyung Rudd, MD as Consulting Physician (Radiation Oncology)  Date of Service:  03/24/2020  CHIEF COMPLAINT: F/u of left breast   SUMMARY OF ONCOLOGIC HISTORY: Oncology History Overview Note  Cancer Staging Malignant neoplasm of upper-outer quadrant of left breast in female, estrogen receptor positive ([place]) Staging form: Breast, AJCC 8th Edition - Clinical stage from 06/15/2019: Stage IA (cT1b, cN0, cM0, G2, ER+, PR-, HER2-) - Signed by Truitt Merle, MD on 06/22/2019 - Pathologic stage from 08/19/2019: Stage IA (pT1b, pN0(sn), cM0, G2, ER+, PR-, HER2-) - Unsigned    Malignant neoplasm of upper-outer quadrant of left breast in female, estrogen receptor positive ([place])  06/14/2019 Mammogram   Diagnostic Mammogram 06/14/19  IMPRESSION: Suspicious mass in the 2 o'clock location of the LEFT breast 5 centimeters from the nipple. Mass is taller than wide and measures 0.8 x 0.7 x 0.8 centimeters.   06/15/2019 Cancer Staging   Staging form: Breast, AJCC 8th Edition - Clinical stage from 06/15/2019: Stage IA (cT1b, cN0, cM0, G2, ER+, PR-, HER2-) - Signed by Truitt Merle, MD on 06/22/2019   06/15/2019 Initial Biopsy   Diagnosis 06/15/19 Breast, left, needle core biopsy, 2 o'clock - INVASIVE MAMMARY CARCINOMA. - SEE COMMENT. Microscopic Comment The carcinoma appears grade 2. The longest span of tumor is 0.7 cm. An E-Cadherin and a breast prognostic profile will be performed and the results reported separately. Dr. Vicente Males has reviewed the case and concurs with this interpretation.     06/15/2019 Receptors her2   PROGNOSTIC INDICATORS 06/15/19 Results: IMMUNOHISTOCHEMICAL AND MORPHOMETRIC ANALYSIS PERFORMED MANUALLY The tumor cells are EQUIVOCAL for Her2 (2+). HER2 by FISH will be preformed and the results reported separately Estrogen Receptor: 100%, POSITIVE, STRONG STAINING INTENSITY Progesterone Receptor: 0%, NEGATIVE Proliferation Marker Ki67: 10% FLUORESCENCE IN-SITU HYBRIDIZATION Results: GROUP 5: HER2 **NEGATIVE**     06/22/2019 Initial Diagnosis   Malignant neoplasm of upper-outer quadrant of left breast in female, estrogen receptor positive ([place])   07/05/2019 Imaging   MRI breast  IMPRESSION: 1. Recently diagnosed left breast malignancy in the upper outer left breast measures 11 x 8 x 9 mm on MRI. 2. No other evidence of left breast malignancy. No evidence of right breast malignancy. No enlarged or abnormal lymph nodes.     07/30/2019 Surgery   LEFT BREAST LUMPECTOMY WITH RADIOACTIVE SEED AND SENTINEL LYMPH NODE BIOPSY by Dr Barry Dienes    07/30/2019 Pathology Results   FINAL MICROSCOPIC DIAGNOSIS:   A. BREAST, LEFT, LUMPECTOMY:  - Invasive lobular carcinoma, 1 cm, Nottingham grade 2 of 3.  - Margins of resection are not involved (Closest margin: 1 mm, lateral).  - Biopsy site.  - See oncology table.   B. SENTINEL LYMPH NODE, LEFT AXILLARY #1, BIOPSY:  - One lymph node, negative for carcinoma (0/1).   C. SENTINEL LYMPH NODE, LEFT AXILLARY #2, BIOPSY:  - One lymph node, negative for carcinoma (0/1).    08/30/2019 - 09/24/2019 Radiation Therapy   Adjuvant Radiation with Dr Lisbeth Renshaw     09/2019 -  Anti-estrogen oral therapy   Anastrozole 25m daily starting 09/2019      CURRENT THERAPY:  Anastrozole 45m daily starting 09/2019  INTERVAL HISTORY:  Molly STERRYis here for a follow up of left breast cancer. She was last seen by me 6 months ago. She presents to the clinic alone. She notes she is doing well. She notes she is tolerating anastrozole well.  She notes mild joint stiffness in her right wrist. She notes she was not able to make her past survivorship clinic visit.     REVIEW OF SYSTEMS:   Constitutional: Denies fevers, chills or abnormal weight loss Eyes: Denies blurriness of vision Ears, nose, mouth, throat, and face: Denies mucositis or sore throat Respiratory: Denies cough, dyspnea or wheezes Cardiovascular: Denies palpitation, chest discomfort or lower extremity swelling Gastrointestinal:  Denies nausea, heartburn or change in bowel habits Skin: Denies abnormal skin rashes MSK: (+) Joint stiffness in right wrist  Lymphatics: Denies new lymphadenopathy or easy bruising Neurological:Denies numbness, tingling or new weaknesses Behavioral/Psych: Mood is stable, no new changes  All other systems were reviewed with the patient and are negative.  MEDICAL HISTORY:  Past Medical History:  Diagnosis Date  . Glaucoma    left eye  . Hyperlipidemia   . Hypertension    NO MEDS    SURGICAL HISTORY: Past Surgical History:  Procedure Laterality Date  . BREAST LUMPECTOMY WITH RADIOACTIVE SEED AND SENTINEL LYMPH NODE BIOPSY Left 07/30/2019   Procedure: LEFT BREAST LUMPECTOMY WITH RADIOACTIVE SEED AND SENTINEL LYMPH NODE BIOPSY;  Surgeon: BStark Klein MD;  [place]  Service: General;  Laterality: Left;  REGIONAL FOR POST OP PAIN  . no prior surgery    . TUBAL LIGATION      I have reviewed the social history and family history with the patient and they are unchanged from previous note.  ALLERGIES:  is allergic to bee venom.  MEDICATIONS:  Current Outpatient Medications  Medication Sig Dispense Refill  . anastrozole (ARIMIDEX) 1 MG tablet TAKE 1 TABLET BY MOUTH EVERY DAY 90 tablet 1  . atorvastatin (LIPITOR) 20 MG tablet Take 1 tablet (20 mg total) by mouth daily. 90 tablet 3  . brimonidine-timolol (COMBIGAN) 0.2-0.5 % ophthalmic solution Place 1 drop into both eyes every 12 (twelve) hours.    . dorzolamide (TRUSOPT) 2  % ophthalmic solution Place 1 drop into both eyes 2 (two) times daily.   11  . EPINEPHrine 0.3 mg/0.3 mL IJ SOAJ injection     . latanoprost (XALATAN) 0.005 % ophthalmic solution Place 1 drop into both eyes at bedtime.      No current facility-administered medications for this visit.    PHYSICAL EXAMINATION: ECOG PERFORMANCE STATUS: 0 - Asymptomatic  Vitals:   03/24/20 0907  BP: (!) 168/71  Pulse: (!) 56  Resp: 17  Temp: 97.7 F (36.5 C)  SpO2: 99%   Filed Weights   03/24/20 0907  Weight: 189 lb 6.4 oz (85.9 kg)    GENERAL:alert, no distress and comfortable SKIN: skin color, texture, turgor are normal, no rashes or significant lesions EYES: normal, Conjunctiva are pink and non-injected, sclera clear  NECK: supple, thyroid normal size, non-tender, without nodularity LYMPH:  no palpable lymphadenopathy in the cervical, axillary  LUNGS: clear to auscultation and percussion with normal breathing effort HEART: regular rate & rhythm and no murmurs and no lower extremity edema ABDOMEN:abdomen soft, non-tender and normal bowel sounds Musculoskeletal:no cyanosis of digits and no clubbing  NEURO: alert & oriented x 3 with fluent speech, no focal motor/sensory deficits BREAST: (+) Known right nipple inversion (+) S/p left  lumpectomy: surgical incision healed well with moderate scar tissue (+) Left breast lymphedema. Breast exam benign.   LABORATORY DATA:  I have reviewed the data as listed CBC Latest Ref Rng & Units 03/24/2020 07/22/2019 06/23/2019  WBC 4.0 - 10.5 K/uL 3.4(L) 4.3 4.1  Hemoglobin 12.0 - 15.0 g/dL 13.5 14.4 13.6  Hematocrit 36.0 - 46.0 % 41.0 45.0 41.0  Platelets 150 - 400 K/uL 166 216 174     CMP Latest Ref Rng & Units 03/24/2020 07/22/2019 06/23/2019  Glucose 70 - 99 mg/dL 89 84 80  BUN 8 - 23 mg/dL 13 11 12   Creatinine 0.44 - 1.00 mg/dL 0.80 0.71 0.77  Sodium 135 - 145 mmol/L 140 139 142  Potassium 3.5 - 5.1 mmol/L 4.4 4.1 4.0  Chloride 98 - 111 mmol/L 108 105 107  CO2  22 - 32 mmol/L 27 24 27   Calcium 8.9 - 10.3 mg/dL 9.2 9.1 9.0  Total Protein 6.5 - 8.1 g/dL 7.1 - 7.0  Total Bilirubin 0.3 - 1.2 mg/dL 0.7 - 1.0  Alkaline Phos 38 - 126 U/L 75 - 64  AST 15 - 41 U/L 21 - 22  ALT 0 - 44 U/L 12 - 14      RADIOGRAPHIC STUDIES: I have personally reviewed the radiological images as listed and agreed with the findings in the report. No results found.   ASSESSMENT & PLAN:  Molly Fuller is a 70 y.o. female with    1.Malignant neoplasm of upper-outer quadrant of left breast, lobular carcinoma, StageIA,p(T1bN0M0), ER+,PR-/HER2-, [place] Fresh -She was diagnosed in 05/2019 with very small left breast massof invasive lobular carcinoma.  -She underwent left lumpectomy with Dr Barry Dienes on 07/30/19. Given her early stage small tumor, chemotherapy is not recommended. She completed Radiation with Dr Lisbeth Renshaw 08/30/19-09/24/19.  -I started her on antiestrogen therapy with Anastrozole in 09/2019. She is tolerating well with mild joint stiffness. Plan for 7-10 years due to lobular histology  -She is clinically doing well. Lab reviewed, her CBC and CMP are within normal limits except WBC 3.4. Her physical exam was unremarkable. There is no clinical concern for recurrence. -Continue surveillance. Next mammogram in 05/2020  -Continue Anastrozole  -f/u in 6 months. She declined rescheduling her survivorship clinic.  -I encourage her to space her appointments with me and Dr. Barry Dienes   2. Bone Health  -Her last DEXA was in 2015. Her 09/2019 DEXA is normal with lowest T-score -0.9 at left hip.  -I previously discussed AI can weaken her bone. Will monitor with DEXA every 2 years.   -I recommend she start OTC Calcium and Vit D, given her prior history of Osteopenia.    PLAN: -Continue anastrozole  -Mammogram in 05/2020 -Lab and F/u in 6 months    No problem-specific Assessment & Plan notes found for this encounter.   Orders Placed This Encounter  Procedures  . MM DIAG  BREAST TOMO BILATERAL    Standing Status:   Future    Standing Expiration Date:   03/24/2021    Order Specific Question:   Reason for Exam (SYMPTOM  OR DIAGNOSIS REQUIRED)    Answer:   screening    Order Specific Question:   Preferred imaging location?    Answer:   [facility]   All questions were answered. The patient knows to call the clinic with any problems, questions or concerns. No barriers to learning was detected. The total time spent in the appointment was 25 minutes.     Truitt Merle, MD  03/24/2020   I, Joslyn Devon, am acting as scribe for Truitt Merle, MD.   I have reviewed the above documentation for accuracy and completeness, and I agree with the above.

## 2020-03-23 ENCOUNTER — Other Ambulatory Visit: Payer: Self-pay

## 2020-03-23 DIAGNOSIS — H401133 Primary open-angle glaucoma, bilateral, severe stage: Secondary | ICD-10-CM | POA: Diagnosis not present

## 2020-03-23 DIAGNOSIS — C50412 Malignant neoplasm of upper-outer quadrant of left female breast: Secondary | ICD-10-CM

## 2020-03-23 DIAGNOSIS — H2513 Age-related nuclear cataract, bilateral: Secondary | ICD-10-CM | POA: Diagnosis not present

## 2020-03-23 DIAGNOSIS — Z170 Estrogen receptor positive status [ER+]: Secondary | ICD-10-CM

## 2020-03-24 ENCOUNTER — Inpatient Hospital Stay: Payer: Medicare Other | Attending: Hematology | Admitting: Hematology

## 2020-03-24 ENCOUNTER — Other Ambulatory Visit: Payer: Self-pay

## 2020-03-24 ENCOUNTER — Encounter: Payer: Self-pay | Admitting: Hematology

## 2020-03-24 ENCOUNTER — Inpatient Hospital Stay: Payer: Medicare Other

## 2020-03-24 VITALS — BP 168/71 | HR 56 | Temp 97.7°F | Resp 17 | Ht 66.0 in | Wt 189.4 lb

## 2020-03-24 DIAGNOSIS — I890 Lymphedema, not elsewhere classified: Secondary | ICD-10-CM | POA: Diagnosis not present

## 2020-03-24 DIAGNOSIS — M8580 Other specified disorders of bone density and structure, unspecified site: Secondary | ICD-10-CM | POA: Diagnosis not present

## 2020-03-24 DIAGNOSIS — Z923 Personal history of irradiation: Secondary | ICD-10-CM | POA: Insufficient documentation

## 2020-03-24 DIAGNOSIS — Z170 Estrogen receptor positive status [ER+]: Secondary | ICD-10-CM | POA: Insufficient documentation

## 2020-03-24 DIAGNOSIS — Z79811 Long term (current) use of aromatase inhibitors: Secondary | ICD-10-CM | POA: Diagnosis not present

## 2020-03-24 DIAGNOSIS — C50412 Malignant neoplasm of upper-outer quadrant of left female breast: Secondary | ICD-10-CM | POA: Insufficient documentation

## 2020-03-24 LAB — CBC WITH DIFFERENTIAL (CANCER CENTER ONLY)
Abs Immature Granulocytes: 0.01 10*3/uL (ref 0.00–0.07)
Basophils Absolute: 0 10*3/uL (ref 0.0–0.1)
Basophils Relative: 1 %
Eosinophils Absolute: 0.1 10*3/uL (ref 0.0–0.5)
Eosinophils Relative: 2 %
HCT: 41 % (ref 36.0–46.0)
Hemoglobin: 13.5 g/dL (ref 12.0–15.0)
Immature Granulocytes: 0 %
Lymphocytes Relative: 39 %
Lymphs Abs: 1.3 10*3/uL (ref 0.7–4.0)
MCH: 28.4 pg (ref 26.0–34.0)
MCHC: 32.9 g/dL (ref 30.0–36.0)
MCV: 86.3 fL (ref 80.0–100.0)
Monocytes Absolute: 0.3 10*3/uL (ref 0.1–1.0)
Monocytes Relative: 10 %
Neutro Abs: 1.7 10*3/uL (ref 1.7–7.7)
Neutrophils Relative %: 48 %
Platelet Count: 166 10*3/uL (ref 150–400)
RBC: 4.75 MIL/uL (ref 3.87–5.11)
RDW: 12.1 % (ref 11.5–15.5)
WBC Count: 3.4 10*3/uL — ABNORMAL LOW (ref 4.0–10.5)
nRBC: 0 % (ref 0.0–0.2)

## 2020-03-24 LAB — CMP (CANCER CENTER ONLY)
ALT: 12 U/L (ref 0–44)
AST: 21 U/L (ref 15–41)
Albumin: 3.6 g/dL (ref 3.5–5.0)
Alkaline Phosphatase: 75 U/L (ref 38–126)
Anion gap: 5 (ref 5–15)
BUN: 13 mg/dL (ref 8–23)
CO2: 27 mmol/L (ref 22–32)
Calcium: 9.2 mg/dL (ref 8.9–10.3)
Chloride: 108 mmol/L (ref 98–111)
Creatinine: 0.8 mg/dL (ref 0.44–1.00)
GFR, Estimated: >60 mL/min (ref >60)
Glucose, Bld: 89 mg/dL (ref 70–99)
Potassium: 4.4 mmol/L (ref 3.5–5.1)
Sodium: 140 mmol/L (ref 135–145)
Total Bilirubin: 0.7 mg/dL (ref 0.3–1.2)
Total Protein: 7.1 g/dL (ref 6.5–8.1)

## 2020-03-27 ENCOUNTER — Telehealth: Payer: Self-pay | Admitting: Hematology

## 2020-03-27 NOTE — Telephone Encounter (Signed)
Left message with follow-up appointment per 2/4 los. Gave option to call back to reschedule if needed.

## 2020-05-01 DIAGNOSIS — L810 Postinflammatory hyperpigmentation: Secondary | ICD-10-CM | POA: Diagnosis not present

## 2020-05-29 ENCOUNTER — Other Ambulatory Visit: Payer: Self-pay

## 2020-05-29 ENCOUNTER — Ambulatory Visit
Admission: RE | Admit: 2020-05-29 | Discharge: 2020-05-29 | Disposition: A | Discharge status: Home / Self Care | Payer: Medicare Other | Source: Ambulatory Visit | Attending: Hematology | Admitting: Hematology

## 2020-05-29 DIAGNOSIS — Z170 Estrogen receptor positive status [ER+]: Secondary | ICD-10-CM

## 2020-05-29 DIAGNOSIS — C50412 Malignant neoplasm of upper-outer quadrant of left female breast: Secondary | ICD-10-CM

## 2020-05-29 DIAGNOSIS — R922 Inconclusive mammogram: Secondary | ICD-10-CM | POA: Diagnosis not present

## 2020-05-29 DIAGNOSIS — Z853 Personal history of malignant neoplasm of breast: Secondary | ICD-10-CM | POA: Diagnosis not present

## 2020-08-25 ENCOUNTER — Other Ambulatory Visit: Payer: Self-pay | Admitting: Hematology

## 2020-08-25 DIAGNOSIS — C50412 Malignant neoplasm of upper-outer quadrant of left female breast: Secondary | ICD-10-CM

## 2020-08-25 DIAGNOSIS — Z170 Estrogen receptor positive status [ER+]: Secondary | ICD-10-CM

## 2020-08-26 ENCOUNTER — Encounter (HOSPITAL_COMMUNITY): Payer: Self-pay

## 2020-09-03 ENCOUNTER — Encounter (HOSPITAL_COMMUNITY): Payer: Self-pay

## 2020-09-03 ENCOUNTER — Other Ambulatory Visit: Payer: Self-pay

## 2020-09-03 ENCOUNTER — Emergency Department (HOSPITAL_COMMUNITY): Payer: Medicare Other

## 2020-09-03 ENCOUNTER — Emergency Department (HOSPITAL_COMMUNITY)
Admission: EM | Admit: 2020-09-03 | Discharge: 2020-09-03 | Disposition: A | Discharge status: Home / Self Care | Payer: Medicare Other | Attending: Emergency Medicine | Admitting: Emergency Medicine

## 2020-09-03 DIAGNOSIS — W182XXA Fall in (into) shower or empty bathtub, initial encounter: Secondary | ICD-10-CM | POA: Diagnosis not present

## 2020-09-03 DIAGNOSIS — R1012 Left upper quadrant pain: Secondary | ICD-10-CM | POA: Diagnosis not present

## 2020-09-03 DIAGNOSIS — W19XXXA Unspecified fall, initial encounter: Secondary | ICD-10-CM

## 2020-09-03 DIAGNOSIS — S2242XA Multiple fractures of ribs, left side, initial encounter for closed fracture: Secondary | ICD-10-CM | POA: Insufficient documentation

## 2020-09-03 DIAGNOSIS — Z853 Personal history of malignant neoplasm of breast: Secondary | ICD-10-CM | POA: Diagnosis not present

## 2020-09-03 DIAGNOSIS — R079 Chest pain, unspecified: Secondary | ICD-10-CM | POA: Diagnosis not present

## 2020-09-03 DIAGNOSIS — K449 Diaphragmatic hernia without obstruction or gangrene: Secondary | ICD-10-CM | POA: Diagnosis not present

## 2020-09-03 DIAGNOSIS — S3991XA Unspecified injury of abdomen, initial encounter: Secondary | ICD-10-CM | POA: Diagnosis not present

## 2020-09-03 DIAGNOSIS — I509 Heart failure, unspecified: Secondary | ICD-10-CM | POA: Diagnosis not present

## 2020-09-03 DIAGNOSIS — S299XXA Unspecified injury of thorax, initial encounter: Secondary | ICD-10-CM | POA: Diagnosis present

## 2020-09-03 DIAGNOSIS — I110 Hypertensive heart disease with heart failure: Secondary | ICD-10-CM | POA: Insufficient documentation

## 2020-09-03 DIAGNOSIS — Y92091 Bathroom in other non-institutional residence as the place of occurrence of the external cause: Secondary | ICD-10-CM | POA: Insufficient documentation

## 2020-09-03 DIAGNOSIS — K8020 Calculus of gallbladder without cholecystitis without obstruction: Secondary | ICD-10-CM | POA: Diagnosis not present

## 2020-09-03 DIAGNOSIS — K7689 Other specified diseases of liver: Secondary | ICD-10-CM | POA: Diagnosis not present

## 2020-09-03 LAB — BASIC METABOLIC PANEL
Anion gap: 6 (ref 5–15)
BUN: 16 mg/dL (ref 8–23)
CO2: 25 mmol/L (ref 22–32)
Calcium: 9.4 mg/dL (ref 8.9–10.3)
Chloride: 108 mmol/L (ref 98–111)
Creatinine, Ser: 0.71 mg/dL (ref 0.44–1.00)
GFR, Estimated: >60 mL/min (ref >60)
Glucose, Bld: 95 mg/dL (ref 70–99)
Potassium: 3.9 mmol/L (ref 3.5–5.1)
Sodium: 139 mmol/L (ref 135–145)

## 2020-09-03 LAB — CBC
HCT: 42.9 % (ref 36.0–46.0)
Hemoglobin: 14.1 g/dL (ref 12.0–15.0)
MCH: 29.4 pg (ref 26.0–34.0)
MCHC: 32.9 g/dL (ref 30.0–36.0)
MCV: 89.6 fL (ref 80.0–100.0)
Platelets: 208 10*3/uL (ref 150–400)
RBC: 4.79 MIL/uL (ref 3.87–5.11)
RDW: 12.8 % (ref 11.5–15.5)
WBC: 7 10*3/uL (ref 4.0–10.5)
nRBC: 0 % (ref 0.0–0.2)

## 2020-09-03 MED ORDER — SODIUM CHLORIDE (PF) 0.9 % IJ SOLN
INTRAMUSCULAR | Status: AC
  Filled 2020-09-03: qty 50

## 2020-09-03 MED ORDER — OXYCODONE-ACETAMINOPHEN 5-325 MG PO TABS
2.0000 | ORAL_TABLET | Freq: Once | ORAL | Status: AC
  Administered 2020-09-03: 2 via ORAL
  Filled 2020-09-03: qty 2

## 2020-09-03 MED ORDER — OXYCODONE-ACETAMINOPHEN 5-325 MG PO TABS: 1.0000 | ORAL_TABLET | Freq: Four times a day (QID) | ORAL | 0 refills | Status: DC | PRN

## 2020-09-03 MED ORDER — IOHEXOL 350 MG/ML SOLN
80.0000 mL | Freq: Once | INTRAVENOUS | Status: AC | PRN
  Administered 2020-09-03: 80 mL via INTRAVENOUS

## 2020-09-03 MED ORDER — FENTANYL CITRATE (PF) 100 MCG/2ML IJ SOLN
50.0000 ug | Freq: Once | INTRAMUSCULAR | Status: AC
Start: 2020-09-03 — End: 2020-09-03
  Administered 2020-09-03: 50 ug via INTRAVENOUS
  Filled 2020-09-03: qty 2

## 2020-09-03 NOTE — Discharge Instructions (Addendum)
Please take pain medicine to enable you to take deep breaths.  Please switch over from the narcotic pain medicine to Tylenol and ibuprofen as soon as you are able.  There is Tylenol in the Percocet, so do not double dose on the Tylenol.  Please use your incentive spirometer every 15 minutes while awake There are nodules noted on your chest CT.  This is an incidental finding.  Please follow-up with your primary care doctor so repeat imaging can be performed if indicated

## 2020-09-03 NOTE — ED Notes (Signed)
Incentive spirometer given to pt.  Verbal instructions provided.  Pt verbalized understanding as well as completed return demonstration.

## 2020-09-03 NOTE — ED Provider Notes (Signed)
[facility] DEPT Provider Note   CSN: [id number] Arrival date & time: 09/03/20  1404     History Chief Complaint  Patient presents with   Lytle Michaels    Molly Fuller is a 70 y.o. female.  HPI 70 year old female history of hyperlipidemia, hypertension, breast cancer presents today complaining of pain in left chest wall after fall.  States she was with her husband in a hotel in [place] this morning.  When she was in the shower she has had a mechanical fall and landed on the ceramic edge with the left side of her chest.  She is complaining of sharp pain throughout the left flank area.  It is painful to breathe he feels slightly dyspneic secondary to pain.  She is not on any blood thinners.  She states she may have struck the left side of her head but is not having any pain there and had no loss of conscious.  She denies neck pain, extremity injury, and has been ambulatory since the fall.  He has not taken any pain medication.  The pain is moderate to severe.    Past Medical History:  Diagnosis Date   Glaucoma    left eye   Hyperlipidemia    Hypertension    NO MEDS    Patient Active Problem List   Diagnosis Date Noted   Malignant neoplasm of upper-outer quadrant of left breast in female, estrogen receptor positive ([place]) 06/22/2019   (HFpEF) heart failure with preserved ejection fraction ([place]) 10/22/2018   Constipation 12/30/2017   Hyperlipidemia type III 12/30/2017   Class 1 obesity with serious comorbidity and body mass index (BMI) of 30.0 to 30.9 in adult 01/09/2015   Routine general medical examination at a health care facility 09/05/2011   Glaucoma 09/05/2011   Hyperlipidemia 12/15/2006    Past Surgical History:  Procedure Laterality Date   BREAST LUMPECTOMY WITH RADIOACTIVE SEED AND SENTINEL LYMPH NODE BIOPSY Left 07/30/2019   Procedure: LEFT BREAST LUMPECTOMY WITH RADIOACTIVE SEED AND SENTINEL LYMPH NODE BIOPSY;  Surgeon: Stark Klein, MD;   Location: [facility];  Service: General;  Laterality: Left;  REGIONAL FOR POST OP PAIN   no prior surgery     TUBAL LIGATION       OB History     Gravida  2   Para  2   Term      Preterm      AB      Living         SAB      IAB      Ectopic      Multiple      Live Births              Family History  Problem Relation Age of Onset   Colon cancer Paternal Grandmother 73   Hypertension Other    Diabetes Other    Breast cancer Neg Hx     Social History   Tobacco Use   Smoking status: Never   Smokeless tobacco: Never  Substance Use Topics   Alcohol use: No   Drug use: No    Home Medications Prior to Admission medications   Medication Sig Start Date End Date Taking? Authorizing Provider  anastrozole (ARIMIDEX) 1 MG tablet TAKE 1 TABLET BY MOUTH EVERY DAY 08/25/20   Truitt Merle, MD  atorvastatin (LIPITOR) 20 MG tablet Take 1 tablet (20 mg total) by mouth daily. 11/04/19 11/03/20  Martinique, Betty G, MD  brimonidine-timolol (COMBIGAN) 0.2-0.5 %  ophthalmic solution Place 1 drop into both eyes every 12 (twelve) hours.    [provider]  dorzolamide (TRUSOPT) 2 % ophthalmic solution Place 1 drop into both eyes 2 (two) times daily.  12/08/17   [provider]  EPINEPHrine 0.3 mg/0.3 mL IJ SOAJ injection  12/30/17   [provider]  latanoprost (XALATAN) 0.005 % ophthalmic solution Place 1 drop into both eyes at bedtime.  07/30/18   [provider]    Allergies    Bee venom  Review of Systems   Review of Systems  All other systems reviewed and are negative.  Physical Exam Updated Vital Signs BP (!) 216/105 (BP Location: Left Arm)   Pulse 60   Temp 98.1 F (36.7 C) (Oral)   Resp 18   LMP 02/19/2000   SpO2 100%   Physical Exam Vitals and nursing note reviewed.  Constitutional:      General: She is not in acute distress.    Appearance: Normal appearance. She is not ill-appearing.  HENT:     Head: Normocephalic.     Right  Ear: Tympanic membrane and external ear normal.     Left Ear: Tympanic membrane and external ear normal.     Nose: Nose normal.     Mouth/Throat:     Mouth: Mucous membranes are moist.     Pharynx: Oropharynx is clear.  Eyes:     Extraocular Movements: Extraocular movements intact.     Pupils: Pupils are equal, round, and reactive to light.  Neck:     Comments: No point tenderness over cervical spine No external signs of trauma to note neck Trachea is midline There is no JVD Cardiovascular:     Rate and Rhythm: Normal rate and regular rhythm.     Pulses: Normal pulses.     Heart sounds: Normal heart sounds.  Pulmonary:     Effort: Pulmonary effort is normal.     Breath sounds: Normal breath sounds.     Comments: No obvious external signs of trauma on chest wall There is tenderness palpation in the left posterior axillary line and the left lower anterior chest wall Abdominal:     General: Bowel sounds are normal. There is no distension.     Palpations: Abdomen is soft.     Comments: There is some tenderness in the left upper quadrant that appears to be more chest wall in nature  Musculoskeletal:        General: No swelling, tenderness, deformity or signs of injury. Normal range of motion.     Cervical back: Normal range of motion and neck supple.     Comments: No obvious external signs of trauma on back No tenderness palpation over thoracic or lumbar spine Pelvis appears stable Full active range of motion bilateral lower and upper extremities  Skin:    General: Skin is warm and dry.     Capillary Refill: Capillary refill takes less than 2 seconds.  Neurological:     General: No focal deficit present.     Mental Status: She is alert.     Cranial Nerves: No cranial nerve deficit.     Motor: No weakness.  Psychiatric:        Mood and Affect: Mood normal.        Behavior: Behavior normal.    ED Results / Procedures / Treatments   Labs (all labs ordered are listed, but only  abnormal results are displayed) Labs Reviewed  CBC  BASIC METABOLIC PANEL  EKG None  Radiology CT ABDOMEN PELVIS W CONTRAST  Result Date: 09/03/2020 CLINICAL DATA:  [place] in tub with left-sided rib pain EXAM: CT ABDOMEN AND PELVIS WITH CONTRAST TECHNIQUE: Multidetector CT imaging of the abdomen and pelvis was performed using the standard protocol following bolus administration of intravenous contrast. CONTRAST:  33mL OMNIPAQUE IOHEXOL 350 MG/ML SOLN COMPARISON:  Chest x-Mariesha Venturella 09/03/2020 FINDINGS: Lower chest: Lung bases demonstrate no pneumothorax. Left posterior pleural thickening. Multiple punctate nodules at the right middle and left lower lobes measuring up to 5 mm in size, series 4, image 14. small hiatal hernia Hepatobiliary: Multiple hepatic cysts. Gallstone. No biliary dilatation Pancreas: Unremarkable. No pancreatic ductal dilatation or surrounding inflammatory changes. Spleen: Normal in size without focal abnormality. Adrenals/Urinary Tract: Adrenal glands are normal. Left parapelvic cysts. Cysts in the right kidney. Additional subcentimeter hypodense renal lesions too small to further characterize. The bladder is normal Stomach/Bowel: Stomach is within normal limits. Appendix appears normal. No evidence of bowel wall thickening, distention, or inflammatory changes. Vascular/Lymphatic: Nonaneurysmal aorta.  No suspicious adenopathy Reproductive: Probable right uterine fibroid. Bilateral ligation clips. No adnexal mass Other: Negative for pelvic effusion or free air Musculoskeletal: Acute left ninth and tenth posterior rib fractures. Acute nondisplaced left eighth lateral rib fracture IMPRESSION: 1. No CT evidence for acute intra-abdominal or pelvic abnormality. 2. Acute left eighth lateral rib fracture and acute mildly comminuted and displaced left ninth and tenth posterior rib fractures with adjacent pleural thickening but no pneumothorax at the lung bases. 3. Cholelithiasis 4. Punctate lower  lung nodules measuring up to 5 mm in size. Consider chest CT follow-up in 3-6 months given history of breast cancer. Electronically Signed   By: Donavan Foil M.D.   On: 09/03/2020 16:17   DG Chest Port 1 View  Result Date: 09/03/2020 CLINICAL DATA:  [address], left-sided chest pain EXAM: PORTABLE CHEST 1 VIEW COMPARISON:  None. FINDINGS: Single frontal view of the chest demonstrates an unremarkable cardiac silhouette. No acute airspace disease, effusion, or pneumothorax. There is a minimally displaced left posterolateral eighth rib fracture. No other acute bony abnormalities. IMPRESSION: 1. Minimally displaced left posterolateral eighth rib fracture. 2. Otherwise no acute intrathoracic process. Electronically Signed   By: Randa Ngo M.D.   On: 09/03/2020 15:11    Procedures Procedures   Medications Ordered in ED Medications  fentaNYL (SUBLIMAZE) injection 50 mcg (has no administration in time range)    ED Course  I have reviewed the triage vital signs and the nursing notes.  Pertinent labs & imaging results that were available during my care of the patient were reviewed by me and considered in my medical decision making (see chart for details).    MDM Rules/Calculators/A&P                          70 year old female with fall today.  Here on evaluation she is found to have 3 left-sided rib fracture with no evidence of pneumothorax.  Patient has been moving around well.  She is oxygenating without difficulty.  Plan p.o. pain medicine and incentive spirometry.  I discussed the rib fractures with the patient and her husband.  We have discussed return precautions and need for follow-up she voices understanding. Additionally, patient had noted nodules on her chest CT in the lower lobes.  Patient is made aware of this and the need for follow-up. Final Clinical Impression(s) / ED Diagnoses Final diagnoses:  Fall, initial encounter  Closed fracture of multiple ribs  of left side, initial encounter     Rx / DC Orders ED Discharge Orders     None        Pattricia Boss, MD 09/03/20 [id number]

## 2020-09-03 NOTE — ED Triage Notes (Signed)
Pt reports falling in the tub this morning. Pt now endorses left sided rib pain. Pt reports hitting her head some, but denies LOC and taking blood thinners. Pt denies any other injuries.

## 2020-09-03 NOTE — ED Notes (Signed)
D/c instructions, medications, pain management, follow up and return precautions d/w pt and spouse.  Both verbalized understanding.  Unable to e sign d/t equipment malfunction.

## 2020-09-06 ENCOUNTER — Ambulatory Visit (INDEPENDENT_AMBULATORY_CARE_PROVIDER_SITE_OTHER): Payer: Medicare Other | Admitting: Family Medicine

## 2020-09-06 ENCOUNTER — Other Ambulatory Visit: Payer: Self-pay

## 2020-09-06 ENCOUNTER — Encounter: Payer: Self-pay | Admitting: Family Medicine

## 2020-09-06 VITALS — BP 122/70 | HR 77 | Resp 12 | Ht 66.0 in

## 2020-09-06 DIAGNOSIS — I5032 Chronic diastolic (congestive) heart failure: Secondary | ICD-10-CM

## 2020-09-06 DIAGNOSIS — K8020 Calculus of gallbladder without cholecystitis without obstruction: Secondary | ICD-10-CM | POA: Insufficient documentation

## 2020-09-06 DIAGNOSIS — S2242XD Multiple fractures of ribs, left side, subsequent encounter for fracture with routine healing: Secondary | ICD-10-CM

## 2020-09-06 DIAGNOSIS — K5900 Constipation, unspecified: Secondary | ICD-10-CM

## 2020-09-06 MED ORDER — OXYCODONE-ACETAMINOPHEN 5-325 MG PO TABS: 1.0000 | ORAL_TABLET | Freq: Three times a day (TID) | ORAL | 0 refills | Status: AC | PRN

## 2020-09-06 MED ORDER — CELECOXIB 100 MG PO CAPS: 100.0000 mg | ORAL_CAPSULE | Freq: Two times a day (BID) | ORAL | 0 refills | Status: AC

## 2020-09-06 NOTE — Patient Instructions (Addendum)
A few things to remember from today's visit:  Closed fracture of multiple ribs of left side with routine healing, subsequent encounter - Plan: celecoxib (CELEBREX) 100 MG capsule, oxyCODONE-acetaminophen (PERCOCET/ROXICET) 5-325 MG tablet  Constipation, unspecified constipation type  If you need refills please call your pharmacy. Do not use My Chart to request refills or for acute issues that need immediate attention.   Caution with Percocet, could be aggravating constipation. Daily activities as tolerated. Incentive spirometry every 2-3 hours. Percocet 3 times per day as needed and Celebrex added today. Celebrex 2 times as needed for up to 10-14 days.  Please be sure medication list is accurate. If a new problem present, please set up appointment sooner than planned today.

## 2020-09-06 NOTE — Progress Notes (Signed)
Chief Complaint  Patient presents with   Hospitalization Follow-up   HPI:  Molly Fuller is a 70 y.o. female with history of hyperlipidemia, constipation, breast cancer, and HFpEF here today with her husband to follow on recent ER visit.  Evaluated in the ED on 09/03/20 after fall.  [place], when she was in the shower she had a mechanical fall, landed on the ceramic edge with the left side of her chest.  Severe left-sided rib cage. Rib x-ray: 1. Minimally displaced left posterolateral eighth rib fracture. 2. Otherwise no acute intrathoracic process. Abdominal CT: 1. No CT evidence for acute intra-abdominal or pelvic abnormality. 2. Acute left eighth lateral rib fracture and acute mildly comminuted and displaced left ninth and tenth posterior rib fractures with adjacent pleural thickening but no pneumothorax at the lung bases. 3. Cholelithiasis 4. Punctate lower lung nodules measuring up to 5 mm in size. Consider chest CT follow-up in 3-6 months given history of breast cancer. She was discharged on Percocet 5-325 mg every 6 hours.  Pain is still severe, constant, exacerbated by certain movements and with deep breathing. Negative for fever, chills, CP, palpitation, cough, dyspnea, wheezing, or rash/edema. She is taking Percocet at bedtime, sleeping well throughout the night. She is doing incentive spirometry every 2-3 hours.  Constipation: Last bowel movement 3 days ago. Negative for abdominal pain, nausea, or vomiting. She has not tried OTC medications.  BP was elevated in the ER at 186/22. History of hypertension on nonpharmacologic treatment. Not checking BP at home recently. HFpEF: Negative for orthopnea, PND, and edema. Echo in 10/2018 showed LVEF> 65% and grade 1 diastolic dysfunction.  Review of Systems  Constitutional:  Positive for activity change and fatigue. Negative for appetite change and fever.  HENT:   Negative for mouth sores, nosebleeds and sinus pressure.   Gastrointestinal:        Negative for changes in bowel habits.  Skin:  Negative for rash and wound.  Neurological:  Negative for syncope, weakness and headaches.  Psychiatric/Behavioral:  Negative for confusion. The patient is nervous/anxious.   Rest see pertinent positives and negatives per HPI.  Current Outpatient Medications on File Prior to Visit  Medication Sig Dispense Refill   anastrozole (ARIMIDEX) 1 MG tablet TAKE 1 TABLET BY MOUTH EVERY DAY (Patient taking differently: Take 1 mg by mouth daily.) 90 tablet 1   atorvastatin (LIPITOR) 20 MG tablet Take 1 tablet (20 mg total) by mouth daily. 90 tablet 3   brimonidine-timolol (COMBIGAN) 0.2-0.5 % ophthalmic solution Place 1 drop into both eyes every 12 (twelve) hours.     dorzolamide (TRUSOPT) 2 % ophthalmic solution Place 1 drop into both eyes 2 (two) times daily.   11   EPINEPHrine 0.3 mg/0.3 mL IJ SOAJ injection Inject 0.3 mg into the muscle as needed for anaphylaxis.     latanoprost (XALATAN) 0.005 % ophthalmic solution Place 1 drop into both eyes at bedtime.      No current facility-administered medications on file prior to visit.   Past Medical History:  Diagnosis Date   Glaucoma    left eye   Hyperlipidemia    Hypertension    NO MEDS   Allergies  Allergen Reactions   Bee Venom Swelling   Social History   Socioeconomic History   Marital status: Married    Spouse name: Not on file   Number of children: 2   Years  of education: Not on file   Highest education level: Not on file  Occupational History   Occupation: retired Associate Professor   Tobacco Use   Smoking status: Never   Smokeless tobacco: Never  Substance and Sexual Activity   Alcohol use: No   Drug use: No   Sexual activity: Not on file  Other Topics Concern   Not on file  Social History Narrative   Not on file   Social Determinants of Health   Financial Resource Strain: Not on file   Food Insecurity: Not on file  Transportation Needs: Not on file  Physical Activity: Not on file  Stress: Not on file  Social Connections: Not on file   Vitals:   09/06/20 1117  BP: 122/70  Pulse: 77  Resp: 12  SpO2: 94%   Body mass index is 30.57 kg/m.  Physical Exam Vitals and nursing note reviewed.  Constitutional:      General: She is not in acute distress.    Appearance: She is well-developed.  HENT:     Head: Normocephalic and atraumatic.  Eyes:     Conjunctiva/sclera: Conjunctivae normal.  Cardiovascular:     Rate and Rhythm: Normal rate and regular rhythm.     Heart sounds: No murmur heard. Pulmonary:     Effort: Pulmonary effort is normal. No respiratory distress.     Breath sounds: Normal breath sounds.     Comments: Left rib cage pain with deep breathing. Abdominal:     Palpations: Abdomen is soft. There is no mass.     Tenderness: There is abdominal tenderness (Mild) in the left upper quadrant. There is no guarding or rebound.  Musculoskeletal:     Right lower leg: No edema.     Left lower leg: No edema.  Skin:    General: Skin is warm.     Findings: No ecchymosis, erythema or rash.  Neurological:     General: No focal deficit present.     Mental Status: She is alert and oriented to person, place, and time.     Cranial Nerves: No cranial nerve deficit.     Gait: Gait normal.     Comments: Antalgic gait, not assisted.  Psychiatric:     Comments: Well groomed, good eye contact.   ASSESSMENT AND PLAN:  Ms.Molly Fuller was seen today for hospitalization follow-up.  Diagnoses and all orders for this visit:  Closed fracture of multiple ribs of left side with routine healing, subsequent encounter Reviewed imaging reports. Continue incentive spirometry and daily activities as tolerated. Avoid shallow breathing. Celebrex 100 mg twice daily added today, recommend taking it for up to 10 to 14 days. We discussed some side effects. Continue Percocet 5-325 mg  3 times daily as needed. Clearly instructed about warning signs.  -     celecoxib (CELEBREX) 100 MG capsule; Take 1 capsule (100 mg total) by mouth 2 (two) times daily for 14 days. -     oxyCODONE-acetaminophen (PERCOCET/ROXICET) 5-325 MG tablet; Take 1 tablet by mouth every 8 (eight) hours as needed for up to 7 days for severe pain.  Constipation, unspecified constipation type We discussed some side effects of Percocet, which could be aggravating problem. Increase fiber intake, adequate hydration. OTC MiraLAX at night as needed recommended.  Chronic heart failure with preserved ejection fraction (HCC) Euvolemic. We discussed some side effects of NSAIDs. Instructed to check BP at home daily. Instructed about warning signs.  Calculus of gallbladder without cholecystitis without obstruction Asymptomatic. We discussed signs  and symptoms. I do not think surgical treatment is needed at this time.  Return if symptoms worsen or fail to improve.  Naydeen Speirs G. Martinique, MD  [facility]. [place] office.

## 2020-09-21 ENCOUNTER — Inpatient Hospital Stay: Payer: Medicare Other

## 2020-09-21 ENCOUNTER — Encounter: Payer: Self-pay | Admitting: Hematology

## 2020-09-21 ENCOUNTER — Inpatient Hospital Stay: Payer: Medicare Other | Attending: Hematology | Admitting: Hematology

## 2020-09-21 ENCOUNTER — Other Ambulatory Visit: Payer: Self-pay

## 2020-09-21 VITALS — BP 175/67 | HR 69 | Temp 98.7°F | Resp 18 | Ht 66.0 in | Wt 198.6 lb

## 2020-09-21 DIAGNOSIS — Z923 Personal history of irradiation: Secondary | ICD-10-CM | POA: Insufficient documentation

## 2020-09-21 DIAGNOSIS — C50412 Malignant neoplasm of upper-outer quadrant of left female breast: Secondary | ICD-10-CM | POA: Insufficient documentation

## 2020-09-21 DIAGNOSIS — Z170 Estrogen receptor positive status [ER+]: Secondary | ICD-10-CM | POA: Insufficient documentation

## 2020-09-21 DIAGNOSIS — Z9181 History of falling: Secondary | ICD-10-CM | POA: Insufficient documentation

## 2020-09-21 DIAGNOSIS — R918 Other nonspecific abnormal finding of lung field: Secondary | ICD-10-CM | POA: Diagnosis not present

## 2020-09-21 LAB — CBC WITH DIFFERENTIAL (CANCER CENTER ONLY)
Abs Immature Granulocytes: 0.02 10*3/uL (ref 0.00–0.07)
Basophils Absolute: 0 10*3/uL (ref 0.0–0.1)
Basophils Relative: 1 %
Eosinophils Absolute: 0.1 10*3/uL (ref 0.0–0.5)
Eosinophils Relative: 3 %
HCT: 39.2 % (ref 36.0–46.0)
Hemoglobin: 13.3 g/dL (ref 12.0–15.0)
Immature Granulocytes: 1 %
Lymphocytes Relative: 37 %
Lymphs Abs: 1.5 10*3/uL (ref 0.7–4.0)
MCH: 28.8 pg (ref 26.0–34.0)
MCHC: 33.9 g/dL (ref 30.0–36.0)
MCV: 84.8 fL (ref 80.0–100.0)
Monocytes Absolute: 0.4 10*3/uL (ref 0.1–1.0)
Monocytes Relative: 9 %
Neutro Abs: 2.1 10*3/uL (ref 1.7–7.7)
Neutrophils Relative %: 49 %
Platelet Count: 173 10*3/uL (ref 150–400)
RBC: 4.62 MIL/uL (ref 3.87–5.11)
RDW: 12.5 % (ref 11.5–15.5)
WBC Count: 4.1 10*3/uL (ref 4.0–10.5)
nRBC: 0 % (ref 0.0–0.2)

## 2020-09-21 LAB — CMP (CANCER CENTER ONLY)
ALT: 20 U/L (ref 0–44)
AST: 19 U/L (ref 15–41)
Albumin: 3.6 g/dL (ref 3.5–5.0)
Alkaline Phosphatase: 96 U/L (ref 38–126)
Anion gap: 8 (ref 5–15)
BUN: 13 mg/dL (ref 8–23)
CO2: 23 mmol/L (ref 22–32)
Calcium: 9.5 mg/dL (ref 8.9–10.3)
Chloride: 111 mmol/L (ref 98–111)
Creatinine: 0.73 mg/dL (ref 0.44–1.00)
GFR, Estimated: >60 mL/min (ref >60)
Glucose, Bld: 91 mg/dL (ref 70–99)
Potassium: 4 mmol/L (ref 3.5–5.1)
Sodium: 142 mmol/L (ref 135–145)
Total Bilirubin: 0.5 mg/dL (ref 0.3–1.2)
Total Protein: 7.1 g/dL (ref 6.5–8.1)

## 2020-09-21 NOTE — Progress Notes (Signed)
[place]   [phone] [phone]   Clinic Follow up Note   Patient Care Team: [place], Betty G, MD as PCP - General (Family Medicine) Clent Jacks, MD (Ophthalmology) Mauro Kaufmann, RN as Oncology Nurse Navigator Rockwell Germany, RN as Oncology Nurse Navigator Stark Klein, MD as Consulting Physician (General Surgery) Truitt Merle, MD as Consulting Physician (Hematology) Kyung Rudd, MD as Consulting Physician (Radiation Oncology)  Date of Service:  09/21/2020  CHIEF COMPLAINT: f/u of left breast cancer  SUMMARY OF ONCOLOGIC HISTORY: Oncology History Overview Note  Cancer Staging Malignant neoplasm of upper-outer quadrant of left breast in female, estrogen receptor positive ([place]) Staging form: Breast, AJCC 8th Edition - Clinical stage from 06/15/2019: Stage IA (cT1b, cN0, cM0, G2, ER+, PR-, HER2-) - Signed by Truitt Merle, MD on 06/22/2019 - Pathologic stage from 08/19/2019: Stage IA (pT1b, pN0(sn), cM0, G2, ER+, PR-, HER2-) - Unsigned    Malignant neoplasm of upper-outer quadrant of left breast in female, estrogen receptor positive ([place])  06/14/2019 Mammogram   Diagnostic Mammogram 06/14/19  IMPRESSION: Suspicious mass in the 2 o'clock location of the LEFT breast 5 centimeters from the nipple. Mass is taller than wide and measures 0.8 x 0.7 x 0.8 centimeters.   06/15/2019 Cancer Staging   Staging form: Breast, AJCC 8th Edition - Clinical stage from 06/15/2019: Stage IA (cT1b, cN0, cM0, G2, ER+, PR-, HER2-) - Signed by Truitt Merle, MD on 06/22/2019    06/15/2019 Initial Biopsy   Diagnosis 06/15/19 Breast, left, needle core biopsy, 2 o'clock - INVASIVE MAMMARY CARCINOMA. - SEE COMMENT. Microscopic Comment The carcinoma appears grade 2. The longest span of tumor is 0.7 cm. An E-Cadherin and a breast prognostic profile will be performed and the results reported separately. Dr. Vicente Males has reviewed the case and concurs with this interpretation.     06/15/2019 Receptors her2   PROGNOSTIC INDICATORS 06/15/19 Results: IMMUNOHISTOCHEMICAL AND MORPHOMETRIC ANALYSIS PERFORMED MANUALLY The tumor cells are EQUIVOCAL for Her2 (2+). HER2 by FISH will be preformed and the results reported separately Estrogen Receptor: 100%, POSITIVE, STRONG STAINING INTENSITY Progesterone Receptor: 0%, NEGATIVE Proliferation Marker Ki67: 10% FLUORESCENCE IN-SITU HYBRIDIZATION Results: GROUP 5: HER2 **NEGATIVE**     06/22/2019 Initial Diagnosis   Malignant neoplasm of upper-outer quadrant of left breast in female, estrogen receptor positive ([place])    07/05/2019 Imaging   MRI breast  IMPRESSION: 1. Recently diagnosed left breast malignancy in the upper outer left breast measures 11 x 8 x 9 mm on MRI. 2. No other evidence of left breast malignancy. No evidence of right breast malignancy. No enlarged or abnormal lymph nodes.     07/30/2019 Surgery   LEFT BREAST LUMPECTOMY WITH RADIOACTIVE SEED AND SENTINEL LYMPH NODE BIOPSY by Dr Barry Dienes    07/30/2019 Pathology Results   FINAL MICROSCOPIC DIAGNOSIS:   A. BREAST, LEFT, LUMPECTOMY:  - Invasive lobular carcinoma, 1 cm, Nottingham grade 2 of 3.  - Margins of resection are not involved (Closest margin: 1 mm, lateral).  - Biopsy site.  - See oncology table.   B. SENTINEL LYMPH NODE, LEFT AXILLARY #1, BIOPSY:  - One lymph node, negative for carcinoma (0/1).   C. SENTINEL LYMPH NODE, LEFT AXILLARY #2, BIOPSY:  - One lymph node, negative for carcinoma (0/1).    08/30/2019 - 09/24/2019 Radiation Therapy   Adjuvant Radiation with Dr Lisbeth Renshaw     09/2019 -  Anti-estrogen oral therapy   Anastrozole 63m daily starting 09/2019      CURRENT  THERAPY:  Anastrozole 65m daily started 09/2019  INTERVAL HISTORY:  Molly MOHAMMEDis here for a follow up of breast cancer. She was last seen by me on 03/24/20. She presents to the clinic alone. She reports since her last visit, she fell in the bath tub and broke several ribs.  She is otherwise doing well.   All other systems were reviewed with the patient and are negative.  MEDICAL HISTORY:  Past Medical History:  Diagnosis Date   Glaucoma    left eye   Hyperlipidemia    Hypertension    NO MEDS    SURGICAL HISTORY: Past Surgical History:  Procedure Laterality Date   BREAST LUMPECTOMY WITH RADIOACTIVE SEED AND SENTINEL LYMPH NODE BIOPSY Left 07/30/2019   Procedure: LEFT BREAST LUMPECTOMY WITH RADIOACTIVE SEED AND SENTINEL LYMPH NODE BIOPSY;  Surgeon: BStark Klein MD;  [facility];  Service: General;  Laterality: Left;  REGIONAL FOR POST OP PAIN   no prior surgery     TUBAL LIGATION      I have reviewed the social history and family history with the patient and they are unchanged from previous note.  ALLERGIES:  is allergic to bee venom.  MEDICATIONS:  Current Outpatient Medications  Medication Sig Dispense Refill   anastrozole (ARIMIDEX) 1 MG tablet TAKE 1 TABLET BY MOUTH EVERY DAY (Patient taking differently: Take 1 mg by mouth daily.) 90 tablet 1   atorvastatin (LIPITOR) 20 MG tablet Take 1 tablet (20 mg total) by mouth daily. 90 tablet 3   brimonidine-timolol (COMBIGAN) 0.2-0.5 % ophthalmic solution Place 1 drop into both eyes every 12 (twelve) hours.     dorzolamide (TRUSOPT) 2 % ophthalmic solution Place 1 drop into both eyes 2 (two) times daily.   11   EPINEPHrine 0.3 mg/0.3 mL IJ SOAJ injection Inject 0.3 mg into the muscle as needed for anaphylaxis.     latanoprost (XALATAN) 0.005 % ophthalmic solution Place 1 drop into both eyes at bedtime.      No current facility-administered medications for this visit.    PHYSICAL EXAMINATION: ECOG PERFORMANCE STATUS: 1 - Symptomatic but completely ambulatory  Vitals:   09/21/20 0757  BP: (!) 175/67  Pulse: 69  Resp: 18  Temp: 98.7 F (37.1 C)  SpO2: 98%   Wt Readings from Last 3 Encounters:  09/21/20 198 lb 9.6 oz (90.1 kg)  03/24/20 189 lb 6.4 oz (85.9 kg)  11/02/19 187 lb (84.8 kg)     GENERAL:alert, no distress and comfortable SKIN: skin color, texture, turgor are normal, no rashes or significant lesions EYES: normal, Conjunctiva are pink and non-injected, sclera clear  NECK: supple, thyroid normal size, non-tender, without nodularity LYMPH:  no palpable lymphadenopathy in the cervical, axillary  LUNGS: clear to auscultation and percussion with normal breathing effort HEART: regular rate & rhythm and no murmurs and no lower extremity edema ABDOMEN:abdomen soft, non-tender and normal bowel sounds Musculoskeletal:no cyanosis of digits and no clubbing  NEURO: alert & oriented x 3 with fluent speech, no focal motor/sensory deficits BREAST: small 1 cm nodule at 2 o'clock, just above incision line, likely scar tissue; no palpable mass or adenopathy bilaterally. Breast exam benign.   LABORATORY DATA:  I have reviewed the data as listed CBC Latest Ref Rng & Units 09/21/2020 09/03/2020 03/24/2020  WBC 4.0 - 10.5 K/uL 4.1 7.0 3.4(L)  Hemoglobin 12.0 - 15.0 g/dL 13.3 14.1 13.5  Hematocrit 36.0 - 46.0 % 39.2 42.9 41.0  Platelets 150 - 400 K/uL 173  208 166     CMP Latest Ref Rng & Units 09/21/2020 09/03/2020 03/24/2020  Glucose 70 - 99 mg/dL 91 95 89  BUN 8 - 23 mg/dL 13 16 13   Creatinine 0.44 - 1.00 mg/dL 0.73 0.71 0.80  Sodium 135 - 145 mmol/L 142 139 140  Potassium 3.5 - 5.1 mmol/L 4.0 3.9 4.4  Chloride 98 - 111 mmol/L 111 108 108  CO2 22 - 32 mmol/L 23 25 27   Calcium 8.9 - 10.3 mg/dL 9.5 9.4 9.2  Total Protein 6.5 - 8.1 g/dL 7.1 - 7.1  Total Bilirubin 0.3 - 1.2 mg/dL 0.5 - 0.7  Alkaline Phos 38 - 126 U/L 96 - 75  AST 15 - 41 U/L 19 - 21  ALT 0 - 44 U/L 20 - 12      RADIOGRAPHIC STUDIES: I have personally reviewed the radiological images as listed and agreed with the findings in the report. No results found.   ASSESSMENT & PLAN:  Molly Fuller is a 70 y.o. female with    1. Malignant neoplasm of upper-outer quadrant of left breast, lobular carcinoma, Stage IA,  p(T1bN0M0), ER+, PR-/HER2-, Grade II  -She was diagnosed in 05/2019 with very small left breast mass of invasive lobular carcinoma.  -She underwent left lumpectomy with Dr Barry Dienes on 07/30/19. Given her early stage small tumor, chemotherapy is not recommended. She completed Radiation with Dr Lisbeth Renshaw 08/30/19-09/24/19.  -I started her on antiestrogen therapy with Anastrozole in 09/2019. She is tolerating well with mild joint stiffness. Plan for 7-10 years due to lobular histology  -Most recent mammogram from 05/29/20 was benign. -She is clinically doing well. Lab reviewed, her CBC is within normal limits, CMP pending. Her physical exam was unremarkable. There is no clinical concern for recurrence. -Continue surveillance. Next mammogram in 05/2021 -Continue Anastrozole -f/u in 6 months.     2. 57m Lung Nodules -seen on CT AP on 09/03/20 which was ordered for fall  -I will order f/u chest CT w/o contrast to further evaluate these.  3. Bone Health -Her last DEXA was in 2015. Her 09/2019 DEXA is normal with lowest T-score -0.9 at left hip. -I again reviewed AI can weaken her bone. Will monitor with DEXA every 2 years. Her next scan is due 09/2021 -she had rib fracture after fall in July 2022, she is recovering well  -I recommend she continue OTC Calcium and Vit D     PLAN:  -I will order a chest CT w/o contrast before next visit  -Continue anastrozole -Mammogram in 05/2021 -Lab and F/u in 6 months     No problem-specific Assessment & Plan notes found for this encounter.   Orders Placed This Encounter  Procedures   CT Chest Wo Contrast    Standing Status:   Future    Standing Expiration Date:   09/21/2021    Order Specific Question:   [facility]?    [facility]  All questions were answered. The patient knows to call the clinic with any problems, questions or concerns. No barriers to learning was detected. The total time spent in the appointment was 30  minutes.     YTruitt Merle MD 09/21/2020   I, KWilburn Mylar am acting as scribe for YTruitt Merle MD.   I have reviewed the above documentation for accuracy and completeness, and I agree with the above.

## 2020-10-16 ENCOUNTER — Telehealth: Payer: Self-pay | Admitting: Hematology

## 2020-10-16 NOTE — Telephone Encounter (Signed)
Sch per 8/4 los, pt aware.

## 2020-11-02 ENCOUNTER — Ambulatory Visit: Payer: Medicare Other

## 2020-11-06 ENCOUNTER — Telehealth: Payer: Self-pay | Admitting: Family Medicine

## 2020-11-06 NOTE — Telephone Encounter (Signed)
Left message for patient to call back and schedule Medicare Annual Wellness Visit (AWV) either virtually or in office. Left  my Molly Fuller number [phone]   Last AWV 11/02/19  please schedule at anytime with [facility] Nurse Health Advisor 1 or 2   This should be a 45 minute visit.

## 2020-11-16 ENCOUNTER — Ambulatory Visit: Payer: Medicare Other

## 2020-12-05 ENCOUNTER — Ambulatory Visit (INDEPENDENT_AMBULATORY_CARE_PROVIDER_SITE_OTHER): Payer: Medicare Other

## 2020-12-05 DIAGNOSIS — Z0000 Encounter for general adult medical examination without abnormal findings: Secondary | ICD-10-CM

## 2020-12-05 NOTE — Progress Notes (Signed)
Subjective:   Molly Fuller is a 70 y.o. female who presents for Medicare Annual (Subsequent) preventive examination.  I connected with Trudee Grip today by telephone and verified that I am speaking with the correct person using two identifiers. Location patient: home Location provider: work Persons participating in the virtual visit: patient, provider.   I discussed the limitations, risks, security and privacy concerns of performing an evaluation and management service by telephone and the availability of in person appointments. I also discussed with the patient that there may be a patient responsible charge related to this service. The patient expressed understanding and verbally consented to this telephonic visit.    Interactive audio and video telecommunications were attempted between this provider and patient, however failed, due to patient having technical difficulties OR patient did not have access to video capability.  We continued and completed visit with audio only.    Review of Systems     Cardiac Risk Factors include: advanced age (>33men, >71 women);dyslipidemia;hypertension     Objective:    Today's Vitals   There is no height or weight on file to calculate BMI.  Advanced Directives 12/05/2020 09/03/2020 09/22/2019 08/19/2019 07/30/2019 06/23/2019 06/23/2019  Does Patient Have a Medical Advance Directive? No No No No No No No  Would patient like information on creating a medical advance directive? - No - Patient declined - No - Patient declined No - Patient declined No - Patient declined -    Current Medications (verified) Outpatient Encounter Medications as of 12/05/2020  Medication Sig   anastrozole (ARIMIDEX) 1 MG tablet TAKE 1 TABLET BY MOUTH EVERY DAY (Patient taking differently: Take 1 mg by mouth daily.)   brimonidine-timolol (COMBIGAN) 0.2-0.5 % ophthalmic solution Place 1 drop into both eyes every 12 (twelve) hours.   EPINEPHrine 0.3 mg/0.3 mL IJ SOAJ  injection Inject 0.3 mg into the muscle as needed for anaphylaxis.   latanoprost (XALATAN) 0.005 % ophthalmic solution Place 1 drop into both eyes at bedtime.    atorvastatin (LIPITOR) 20 MG tablet Take 1 tablet (20 mg total) by mouth daily.   dorzolamide (TRUSOPT) 2 % ophthalmic solution Place 1 drop into both eyes 2 (two) times daily.  (Patient not taking: Reported on 12/05/2020)   No facility-administered encounter medications on file as of 12/05/2020.    Allergies (verified) Bee venom   History: Past Medical History:  Diagnosis Date   Glaucoma    left eye   Hyperlipidemia    Hypertension    NO MEDS   Past Surgical History:  Procedure Laterality Date   BREAST LUMPECTOMY WITH RADIOACTIVE SEED AND SENTINEL LYMPH NODE BIOPSY Left 07/30/2019   Procedure: LEFT BREAST LUMPECTOMY WITH RADIOACTIVE SEED AND SENTINEL LYMPH NODE BIOPSY;  Surgeon: Stark Klein, MD;  Location: [facility];  Service: General;  Laterality: Left;  REGIONAL FOR POST OP PAIN   no prior surgery     TUBAL LIGATION     Family History  Problem Relation Age of Onset   Colon cancer Paternal Grandmother 66   Hypertension Other    Diabetes Other    Breast cancer Neg Hx    Social History   Socioeconomic History   Marital status: Married    Spouse name: Not on file   Number of children: 2   Years of education: Not on file   Highest education level: Not on file  Occupational History   Occupation: retired Associate Professor   Tobacco Use   Smoking status: Never   Smokeless  tobacco: Never  Substance and Sexual Activity   Alcohol use: No   Drug use: No   Sexual activity: Not on file  Other Topics Concern   Not on file  Social History Narrative   Not on file   Social Determinants of Health   Financial Resource Strain: Low Risk    Difficulty of Paying Living Expenses: Not hard at all  Food Insecurity: No Food Insecurity   Worried About Running Out of Food in the Last Year: Never true   [place] in the  Last Year: Never true  Transportation Needs: No Transportation Needs   Lack of Transportation (Medical): No   Lack of Transportation (Non-Medical): No  Physical Activity: Insufficiently Active   Days of Exercise per Week: 3 days   Minutes of Exercise per Session: 20 min  Stress: No Stress Concern Present   Feeling of Stress : Not at all  Social Connections: Moderately Integrated   Frequency of Communication with Friends and Family: Twice a week   Frequency of Social Gatherings with Friends and Family: Twice a week   Attends Religious Services: More than 4 times per year   Active Member of [employer] or Organizations: No   Attends Music therapist: Never   Marital Status: Married    Tobacco Counseling Counseling given: Not Answered   Clinical Intake:  Pre-visit preparation completed: Yes  Pain : No/denies pain     Nutritional Risks: None Diabetes: No  How often do you need to have someone help you when you read instructions, pamphlets, or other written materials from your doctor or pharmacy?: 1 - Never What is the last grade level you completed in school?: college  Diabetic?no  Interpreter Needed?: No  Information entered by :: [place] of Daily Living In your present state of health, do you have any difficulty performing the following activities: 12/05/2020  Hearing? N  Vision? N  Difficulty concentrating or making decisions? N  Walking or climbing stairs? N  Dressing or bathing? N  Doing errands, shopping? N  Preparing Food and eating ? N  Using the Toilet? N  In the past six months, have you accidently leaked urine? N  Do you have problems with loss of bowel control? N  Managing your Medications? N  Managing your Finances? N  Housekeeping or managing your Housekeeping? N  Some recent data might be hidden    Patient Care Team: [place], Betty G, MD as PCP - General (Family Medicine) Clent Jacks, MD (Ophthalmology) Mauro Kaufmann, RN as Oncology Nurse Navigator Rockwell Germany, RN as Oncology Nurse Navigator Stark Klein, MD as Consulting Physician (General Surgery) Truitt Merle, MD as Consulting Physician (Hematology) Kyung Rudd, MD as Consulting Physician (Radiation Oncology)  Indicate any recent [place] you may have received from other than [facility] providers in the past year (date may be approximate).     Assessment:   This is a routine wellness examination for [place].  Hearing/Vision screen Vision Screening - Comments:: Annual eye exams wears glasses /contacts   Dietary issues and exercise activities discussed: Current Exercise Habits: Home exercise routine, Type of exercise: walking, Time (Minutes): 20, Frequency (Times/Week): 3, Weekly Exercise (Minutes/Week): 60, Intensity: Mild, Exercise limited by: None identified   Goals Addressed   None    Depression Screen PHQ 2/9 Scores 12/05/2020 12/05/2020 11/02/2019 08/17/2018 03/29/2016 02/15/2016 11/30/2015  PHQ - 2 Score 0 0 0 0 0 0 0    Fall Risk Fall  Risk  12/05/2020 11/02/2019 08/17/2018 01/02/2018 03/29/2016  Falls in the past year? 1 0 0 0 No  Comment - - - [place] Telephone Survey: data to providers prior to load -  Number falls in past yr: 1 0 0 - -  Comment fell in bath tub broke 3 ribs 08/2020 - - - -  Injury with Fall? 1 0 0 - -  Risk for fall due to : No Fall Risks - - - -  Follow up Falls evaluation completed Education provided Education provided - -    FALL RISK PREVENTION PERTAINING TO THE HOME:  Any stairs in or around the home? No  If so, are there any without handrails? No  Home free of loose throw rugs in walkways, pet beds, electrical cords, etc? Yes  Adequate lighting in your home to reduce risk of falls? Yes   ASSISTIVE DEVICES UTILIZED TO PREVENT FALLS:  Life alert? No  Use of a cane, walker or w/c? No  Grab bars in the bathroom? Yes  Shower chair or bench in shower? No  Elevated toilet seat or a handicapped toilet? No     Cognitive Function:    Normal cognitive status assessed by direct observation by this Nurse Health Advisor. No abnormalities found.      Immunizations Immunization History  Administered Date(s) Administered   PFIZER(Purple Top)SARS-COV-2 Vaccination 03/15/2019, 04/05/2019   Td 02/18/2002, 02/19/2008    TDAP status: Due, Education has been provided regarding the importance of this vaccine. Advised may receive this vaccine at local pharmacy or Health Dept. Aware to provide a copy of the vaccination record if obtained from local pharmacy or Health Dept. Verbalized acceptance and understanding.  Flu Vaccine status: Declined, Education has been provided regarding the importance of this vaccine but patient still declined. Advised may receive this vaccine at local pharmacy or Health Dept. Aware to provide a copy of the vaccination record if obtained from local pharmacy or Health Dept. Verbalized acceptance and understanding.  Pneumococcal vaccine status: Declined,  Education has been provided regarding the importance of this vaccine but patient still declined. Advised may receive this vaccine at local pharmacy or Health Dept. Aware to provide a copy of the vaccination record if obtained from local pharmacy or Health Dept. Verbalized acceptance and understanding.   Covid-19 vaccine status: Completed vaccines  Qualifies for Shingles Vaccine? Yes   Zostavax completed No   Shingrix Completed?: No.    Education has been provided regarding the importance of this vaccine. Patient has been advised to call insurance company to determine out of pocket expense if they have not yet received this vaccine. Advised may also receive vaccine at local pharmacy or Health Dept. Verbalized acceptance and understanding.  Screening Tests Health Maintenance  Topic Date Due   Zoster Vaccines- Shingrix (1 of 2) Never done   COVID-19 Vaccine (3 - Pfizer risk series) 05/03/2019   MAMMOGRAM  05/29/2021    COLONOSCOPY (Pts 45-70yrs Insurance coverage will need to be confirmed)  05/28/2022   DEXA SCAN  Completed   HPV VACCINES  Aged Out   INFLUENZA VACCINE  Discontinued   TETANUS/TDAP  Discontinued   Hepatitis C Screening  Discontinued    Health Maintenance  Health Maintenance Due  Topic Date Due   Zoster Vaccines- Shingrix (1 of 2) Never done   COVID-19 Vaccine (3 - Pfizer risk series) 05/03/2019    Colorectal cancer screening: Type of screening: Colonoscopy. Completed 05/27/2012. Repeat every 10 years  Mammogram status: Completed 05/29/2020. Repeat  every year  Bone Density status: Completed 10/01/2019. Results reflect: Bone density results: OSTEOPENIA. Repeat every 5 years.  Lung Cancer Screening: (Low Dose CT Chest recommended if Age 46-80 years, 30 pack-year currently smoking OR have quit w/in 15years.) does not qualify.   Lung Cancer Screening Referral: n/a  Additional Screening:  Hepatitis C Screening: does not qualify;   Vision Screening: Recommended annual ophthalmology exams for early detection of glaucoma and other disorders of the eye. Is the patient up to date with their annual eye exam?  Yes  Who is the provider or what is the name of the office in which the patient attends annual eye exams? Dr.Groat  If pt is not established with a provider, would they like to be referred to a provider to establish care? No .   Dental Screening: Recommended annual dental exams for proper oral hygiene  Community Resource Referral / Chronic Care Management: CRR required this visit?  No   CCM required this visit?  No      Plan:     I have personally reviewed and noted the following in the patient's chart:   Medical and social history Use of alcohol, tobacco or illicit drugs  Current medications and supplements including opioid prescriptions.  Functional ability and status Nutritional status Physical activity Advanced directives List of other physicians Hospitalizations,  surgeries, and ER visits in previous 12 months Vitals Screenings to include cognitive, depression, and falls Referrals and appointments  In addition, I have reviewed and discussed with patient certain preventive protocols, quality metrics, and best practice recommendations. A written personalized care plan for preventive services as well as general preventive health recommendations were provided to patient.     Randel Pigg, LPN   [id number]   Nurse Notes: none

## 2020-12-05 NOTE — Patient Instructions (Signed)
Molly Fuller , Thank you for taking time to come for your Medicare Wellness Visit. I appreciate your ongoing commitment to your health goals. Please review the following plan we discussed and let me know if I can assist you in the future.   Screening recommendations/referrals: Colonoscopy: 05/27/2012  due 2024 Mammogram: 05/29/2020 Bone Density: 10/01/2019 Recommended yearly ophthalmology/optometry visit for glaucoma screening and checkup Recommended yearly dental visit for hygiene and checkup  Vaccinations: Influenza vaccine: due in fall 2022  Pneumococcal vaccine: declined  Tdap vaccine: due with injury  Shingles vaccine: declined     Advanced directives: none   Conditions/risks identified: none   Next appointment: CPE 01/03/2021  Dr Martinique 0830am   Preventive Care 72 Years and Older, Female Preventive care refers to lifestyle choices and visits with your health care provider that can promote health and wellness. What does preventive care include? A yearly physical exam. This is also called an annual well check. Dental exams once or twice a year. Routine eye exams. Ask your health care provider how often you should have your eyes checked. Personal lifestyle choices, including: Daily care of your teeth and gums. Regular physical activity. Eating a healthy diet. Avoiding tobacco and drug use. Limiting alcohol use. Practicing safe sex. Taking low-dose aspirin every day. Taking vitamin and mineral supplements as recommended by your health care provider. What happens during an annual well check? The services and screenings done by your health care provider during your annual well check will depend on your age, overall health, lifestyle risk factors, and family history of disease. Counseling  Your health care provider may ask you questions about your: Alcohol use. Tobacco use. Drug use. Emotional well-being. Home and relationship well-being. Sexual activity. Eating  habits. History of falls. Memory and ability to understand (cognition). Work and work Statistician. Reproductive health. Screening  You may have the following tests or measurements: Height, weight, and BMI. Blood pressure. Lipid and cholesterol levels. These may be checked every 5 years, or more frequently if you are over 74 years old. Skin check. Lung cancer screening. You may have this screening every year starting at age 24 if you have a 30-pack-year history of smoking and currently smoke or have quit within the past 15 years. Fecal occult blood test (FOBT) of the stool. You may have this test every year starting at age 61. Flexible sigmoidoscopy or colonoscopy. You may have a sigmoidoscopy every 5 years or a colonoscopy every 10 years starting at age 68. Hepatitis C blood test. Hepatitis B blood test. Sexually transmitted disease (STD) testing. Diabetes screening. This is done by checking your blood sugar (glucose) after you have not eaten for a while (fasting). You may have this done every 1-3 years. Bone density scan. This is done to screen for osteoporosis. You may have this done starting at age 66. Mammogram. This may be done every 1-2 years. Talk to your health care provider about how often you should have regular mammograms. Talk with your health care provider about your test results, treatment options, and if necessary, the need for more tests. Vaccines  Your health care provider may recommend certain vaccines, such as: Influenza vaccine. This is recommended every year. Tetanus, diphtheria, and acellular pertussis (Tdap, Td) vaccine. You may need a Td booster every 10 years. Zoster vaccine. You may need this after age 24. Pneumococcal 13-valent conjugate (PCV13) vaccine. One dose is recommended after age 68. Pneumococcal polysaccharide (PPSV23) vaccine. One dose is recommended after age 30. Talk to your health  care provider about which screenings and vaccines you need and how  often you need them. This information is not intended to replace advice given to you by your health care provider. Make sure you discuss any questions you have with your health care provider. Document Released: 03/03/2015 Document Revised: 10/25/2015 Document Reviewed: 12/06/2014 Elsevier Interactive Patient Education  2017 [place] Prevention in the Home Falls can cause injuries. They can happen to people of all ages. There are many things you can do to make your home safe and to help prevent falls. What can I do on the outside of my home? Regularly fix the edges of walkways and driveways and fix any cracks. Remove anything that might make you trip as you walk through a door, such as a raised step or threshold. Trim any bushes or trees on the path to your home. Use bright outdoor lighting. Clear any walking paths of anything that might make someone trip, such as rocks or tools. Regularly check to see if handrails are loose or broken. Make sure that both sides of any steps have handrails. Any raised decks and porches should have guardrails on the edges. Have any leaves, snow, or ice cleared regularly. Use sand or salt on walking paths during winter. Clean up any spills in your garage right away. This includes oil or grease spills. What can I do in the bathroom? Use night lights. Install grab bars by the toilet and in the tub and shower. Do not use towel bars as grab bars. Use non-skid mats or decals in the tub or shower. If you need to sit down in the shower, use a plastic, non-slip stool. Keep the floor dry. Clean up any water that spills on the floor as soon as it happens. Remove soap buildup in the tub or shower regularly. Attach bath mats securely with double-sided non-slip rug tape. Do not have throw rugs and other things on the floor that can make you trip. What can I do in the bedroom? Use night lights. Make sure that you have a light by your bed that is easy to  reach. Do not use any sheets or blankets that are too big for your bed. They should not hang down onto the floor. Have a firm chair that has side arms. You can use this for support while you get dressed. Do not have throw rugs and other things on the floor that can make you trip. What can I do in the kitchen? Clean up any spills right away. Avoid walking on wet floors. Keep items that you use a lot in easy-to-reach places. If you need to reach something above you, use a strong step stool that has a grab bar. Keep electrical cords out of the way. Do not use floor polish or wax that makes floors slippery. If you must use wax, use non-skid floor wax. Do not have throw rugs and other things on the floor that can make you trip. What can I do with my stairs? Do not leave any items on the stairs. Make sure that there are handrails on both sides of the stairs and use them. Fix handrails that are broken or loose. Make sure that handrails are as long as the stairways. Check any carpeting to make sure that it is firmly attached to the stairs. Fix any carpet that is loose or worn. Avoid having throw rugs at the top or bottom of the stairs. If you do have throw rugs, attach them to  the floor with carpet tape. Make sure that you have a light switch at the top of the stairs and the bottom of the stairs. If you do not have them, ask someone to add them for you. What else can I do to help prevent falls? Wear shoes that: Do not have high heels. Have rubber bottoms. Are comfortable and fit you well. Are closed at the toe. Do not wear sandals. If you use a stepladder: Make sure that it is fully opened. Do not climb a closed stepladder. Make sure that both sides of the stepladder are locked into place. Ask someone to hold it for you, if possible. Clearly mark and make sure that you can see: Any grab bars or handrails. First and last steps. Where the edge of each step is. Use tools that help you move  around (mobility aids) if they are needed. These include: Canes. Walkers. Scooters. Crutches. Turn on the lights when you go into a dark area. Replace any light bulbs as soon as they burn out. Set up your furniture so you have a clear path. Avoid moving your furniture around. If any of your floors are uneven, fix them. If there are any pets around you, be aware of where they are. Review your medicines with your doctor. Some medicines can make you feel dizzy. This can increase your chance of falling. Ask your doctor what other things that you can do to help prevent falls. This information is not intended to replace advice given to you by your health care provider. Make sure you discuss any questions you have with your health care provider. Document Released: 12/01/2008 Document Revised: 07/13/2015 Document Reviewed: 03/11/2014 Elsevier Interactive Patient Education  2017 [employer].

## 2020-12-06 ENCOUNTER — Other Ambulatory Visit: Payer: Self-pay | Admitting: Family Medicine

## 2020-12-06 DIAGNOSIS — E785 Hyperlipidemia, unspecified: Secondary | ICD-10-CM

## 2021-01-03 ENCOUNTER — Encounter: Payer: Medicare Other | Admitting: Family Medicine

## 2021-03-14 ENCOUNTER — Other Ambulatory Visit: Payer: Self-pay | Admitting: Hematology

## 2021-03-14 ENCOUNTER — Ambulatory Visit: Payer: Medicare Other | Admitting: Family Medicine

## 2021-03-14 DIAGNOSIS — C50412 Malignant neoplasm of upper-outer quadrant of left female breast: Secondary | ICD-10-CM

## 2021-03-22 ENCOUNTER — Ambulatory Visit (HOSPITAL_COMMUNITY): Payer: Medicare Other

## 2021-03-22 ENCOUNTER — Inpatient Hospital Stay: Payer: Medicare Other

## 2021-03-22 ENCOUNTER — Other Ambulatory Visit: Payer: Medicare Other

## 2021-03-22 ENCOUNTER — Encounter (HOSPITAL_COMMUNITY): Payer: Self-pay

## 2021-03-29 ENCOUNTER — Inpatient Hospital Stay: Payer: Medicare Other | Admitting: Hematology

## 2021-04-10 ENCOUNTER — Other Ambulatory Visit: Payer: Self-pay

## 2021-04-10 ENCOUNTER — Ambulatory Visit (HOSPITAL_COMMUNITY)
Admission: RE | Admit: 2021-04-10 | Discharge: 2021-04-10 | Disposition: A | Discharge status: Home / Self Care | Payer: Medicare Other | Source: Ambulatory Visit | Attending: Hematology | Admitting: Hematology

## 2021-04-10 ENCOUNTER — Inpatient Hospital Stay: Payer: Medicare Other | Attending: Hematology

## 2021-04-10 DIAGNOSIS — C50412 Malignant neoplasm of upper-outer quadrant of left female breast: Secondary | ICD-10-CM | POA: Insufficient documentation

## 2021-04-10 DIAGNOSIS — I3139 Other pericardial effusion (noninflammatory): Secondary | ICD-10-CM | POA: Diagnosis not present

## 2021-04-10 DIAGNOSIS — R911 Solitary pulmonary nodule: Secondary | ICD-10-CM | POA: Diagnosis not present

## 2021-04-10 DIAGNOSIS — Z170 Estrogen receptor positive status [ER+]: Secondary | ICD-10-CM | POA: Insufficient documentation

## 2021-04-10 DIAGNOSIS — I700 Atherosclerosis of aorta: Secondary | ICD-10-CM | POA: Diagnosis not present

## 2021-04-10 DIAGNOSIS — R918 Other nonspecific abnormal finding of lung field: Secondary | ICD-10-CM | POA: Diagnosis not present

## 2021-04-10 LAB — CBC WITH DIFFERENTIAL/PLATELET
Abs Immature Granulocytes: 0.01 10*3/uL (ref 0.00–0.07)
Basophils Absolute: 0 10*3/uL (ref 0.0–0.1)
Basophils Relative: 0 %
Eosinophils Absolute: 0.1 10*3/uL (ref 0.0–0.5)
Eosinophils Relative: 2 %
HCT: 41.7 % (ref 36.0–46.0)
Hemoglobin: 14.1 g/dL (ref 12.0–15.0)
Immature Granulocytes: 0 %
Lymphocytes Relative: 37 %
Lymphs Abs: 1.3 10*3/uL (ref 0.7–4.0)
MCH: 28.8 pg (ref 26.0–34.0)
MCHC: 33.8 g/dL (ref 30.0–36.0)
MCV: 85.3 fL (ref 80.0–100.0)
Monocytes Absolute: 0.3 10*3/uL (ref 0.1–1.0)
Monocytes Relative: 9 %
Neutro Abs: 1.8 10*3/uL (ref 1.7–7.7)
Neutrophils Relative %: 52 %
Platelets: 180 10*3/uL (ref 150–400)
RBC: 4.89 MIL/uL (ref 3.87–5.11)
RDW: 12.5 % (ref 11.5–15.5)
WBC: 3.5 10*3/uL — ABNORMAL LOW (ref 4.0–10.5)
nRBC: 0 % (ref 0.0–0.2)

## 2021-04-10 LAB — COMPREHENSIVE METABOLIC PANEL
ALT: 22 U/L (ref 0–44)
AST: 18 U/L (ref 15–41)
Albumin: 4 g/dL (ref 3.5–5.0)
Alkaline Phosphatase: 70 U/L (ref 38–126)
Anion gap: 6 (ref 5–15)
BUN: 11 mg/dL (ref 8–23)
CO2: 27 mmol/L (ref 22–32)
Calcium: 9.6 mg/dL (ref 8.9–10.3)
Chloride: 107 mmol/L (ref 98–111)
Creatinine, Ser: 0.66 mg/dL (ref 0.44–1.00)
GFR, Estimated: >60 mL/min (ref >60)
Glucose, Bld: 94 mg/dL (ref 70–99)
Potassium: 4 mmol/L (ref 3.5–5.1)
Sodium: 140 mmol/L (ref 135–145)
Total Bilirubin: 0.8 mg/dL (ref 0.3–1.2)
Total Protein: 7.4 g/dL (ref 6.5–8.1)

## 2021-04-13 ENCOUNTER — Inpatient Hospital Stay: Payer: Medicare Other

## 2021-04-13 ENCOUNTER — Other Ambulatory Visit (HOSPITAL_COMMUNITY): Payer: Medicare Other

## 2021-04-19 DIAGNOSIS — H2513 Age-related nuclear cataract, bilateral: Secondary | ICD-10-CM | POA: Diagnosis not present

## 2021-04-19 DIAGNOSIS — H401133 Primary open-angle glaucoma, bilateral, severe stage: Secondary | ICD-10-CM | POA: Diagnosis not present

## 2021-04-20 ENCOUNTER — Encounter: Payer: Self-pay | Admitting: Hematology

## 2021-04-20 ENCOUNTER — Other Ambulatory Visit: Payer: Self-pay

## 2021-04-20 ENCOUNTER — Inpatient Hospital Stay: Payer: Medicare Other | Attending: Hematology | Admitting: Hematology

## 2021-04-20 VITALS — BP 156/78 | HR 58 | Temp 98.4°F | Resp 18 | Ht 66.0 in | Wt 202.3 lb

## 2021-04-20 DIAGNOSIS — Z79899 Other long term (current) drug therapy: Secondary | ICD-10-CM | POA: Diagnosis not present

## 2021-04-20 DIAGNOSIS — Z170 Estrogen receptor positive status [ER+]: Secondary | ICD-10-CM | POA: Diagnosis not present

## 2021-04-20 DIAGNOSIS — C50412 Malignant neoplasm of upper-outer quadrant of left female breast: Secondary | ICD-10-CM | POA: Insufficient documentation

## 2021-04-20 DIAGNOSIS — Z79811 Long term (current) use of aromatase inhibitors: Secondary | ICD-10-CM | POA: Insufficient documentation

## 2021-04-20 DIAGNOSIS — R918 Other nonspecific abnormal finding of lung field: Secondary | ICD-10-CM | POA: Insufficient documentation

## 2021-04-20 DIAGNOSIS — E2839 Other primary ovarian failure: Secondary | ICD-10-CM

## 2021-04-20 DIAGNOSIS — Z1231 Encounter for screening mammogram for malignant neoplasm of breast: Secondary | ICD-10-CM | POA: Diagnosis not present

## 2021-04-20 NOTE — Progress Notes (Signed)
[place]   Telephone:([phone]) [phone] Fax:(336) [phone]   Clinic Follow up Note   Patient Care Team: Martinique, Betty G, MD as PCP - General (Family Medicine) Clent Jacks, MD (Ophthalmology) Mauro Kaufmann, RN as Oncology Nurse Navigator Rockwell Germany, RN as Oncology Nurse Navigator Stark Klein, MD as Consulting Physician (General Surgery) Truitt Merle, MD as Consulting Physician (Hematology) Kyung Rudd, MD as Consulting Physician (Radiation Oncology)  Date of Service:  04/20/2021  CHIEF COMPLAINT: f/u of left breast cancer  CURRENT THERAPY:  Anastrozole 17m daily started 09/2019  ASSESSMENT & PLAN:  Molly PICKINGis a 71y.o. female with   1. Malignant neoplasm of upper-outer quadrant of left breast, lobular carcinoma, Stage IA, p(T1bN0M0), ER+, PR-/HER2-, Grade II  -She was diagnosed in 05/2019 with very small left breast mass of invasive lobular carcinoma. She underwent left lumpectomy with Dr BBarry Dieneson 07/30/19 and completed Radiation with Dr MLisbeth Renshaw7/12/21-09/24/19.  -I started her on antiestrogen therapy with Anastrozole in 09/2019. She is tolerating well with mild joint stiffness. Plan for 7-10 years due to lobular histology  -Most recent mammogram from 05/29/20 was benign. -She is clinically doing well. Lab from 04/10/21 reviewed, her CMP and CBC are WNL except WBC of 3.5. Her physical exam was unremarkable. There is no clinical concern for recurrence. -Continue surveillance. Next mammogram in 05/2021 -Continue Anastrozole -f/u in 6 months.     2. 511mLung Nodules -seen on CT AP on 09/03/20 which was ordered for fall  -stable at 5 mm on CT chest on 04/10/21   3. Bone Health -Her last DEXA was in 2015. Her 09/2019 DEXA is normal with lowest T-score -0.9 at left hip. -I again reviewed AI can weaken her bone. Will monitor with DEXA every 2 years. Her next scan is due 09/2021 -I recommend she continue OTC Calcium and Vit D     PLAN:  -Continue  anastrozole -Mammogram in 05/2021 -DEXA in 09/2021 -Lab and F/u with NP Lacie in 6 months    No problem-specific Assessment & Plan notes found for this encounter.   SUMMARY OF ONCOLOGIC HISTORY: Oncology History Overview Note  Cancer Staging Malignant neoplasm of upper-outer quadrant of left breast in female, estrogen receptor positive ([place]aging form: Breast, AJCC 8th Edition - Clinical stage from 06/15/2019: Stage IA (cT1b, cN0, cM0, G2, ER+, PR-, HER2-) - Signed by FeTruitt MerleMD on 06/22/2019 - Pathologic stage from 08/19/2019: Stage IA (pT1b, pN0(sn), cM0, G2, ER+, PR-, HER2-) - Unsigned    Malignant neoplasm of upper-outer quadrant of left breast in female, estrogen receptor positive ([facility] 06/14/2019 Mammogram   Diagnostic Mammogram 06/14/19  IMPRESSION: Suspicious mass in the 2 o'clock location of the LEFT breast 5 centimeters from the nipple. Mass is taller than wide and measures 0.8 x 0.7 x 0.8 centimeters.   06/15/2019 Cancer Staging   Staging form: Breast, AJCC 8th Edition - Clinical stage from 06/15/2019: Stage IA (cT1b, cN0, cM0, G2, ER+, PR-, HER2-) - Signed by FeTruitt MerleMD on 06/22/2019    06/15/2019 Initial Biopsy   Diagnosis 06/15/19 Breast, left, needle core biopsy, 2 o'clock - INVASIVE MAMMARY CARCINOMA. - SEE COMMENT. Microscopic Comment The carcinoma appears grade 2. The longest span of tumor is 0.7 cm. An E-Cadherin and a breast prognostic profile will be performed and the results reported separately. Dr. JuVicente Malesas reviewed the case and concurs with this interpretation.    06/15/2019 Receptors her2   PROGNOSTIC INDICATORS 06/15/19 Results:  IMMUNOHISTOCHEMICAL AND MORPHOMETRIC ANALYSIS PERFORMED MANUALLY The tumor cells are EQUIVOCAL for Her2 (2+). HER2 by FISH will be preformed and the results reported separately Estrogen Receptor: 100%, POSITIVE, STRONG STAINING INTENSITY Progesterone Receptor: 0%, NEGATIVE Proliferation Marker Ki67: 10% FLUORESCENCE  IN-SITU HYBRIDIZATION Results: GROUP 5: HER2 **NEGATIVE**     06/22/2019 Initial Diagnosis   Malignant neoplasm of upper-outer quadrant of left breast in female, estrogen receptor positive ([place])   07/05/2019 Imaging   MRI breast  IMPRESSION: 1. Recently diagnosed left breast malignancy in the upper outer left breast measures 11 x 8 x 9 mm on MRI. 2. No other evidence of left breast malignancy. No evidence of right breast malignancy. No enlarged or abnormal lymph nodes.     07/30/2019 Surgery   LEFT BREAST LUMPECTOMY WITH RADIOACTIVE SEED AND SENTINEL LYMPH NODE BIOPSY by Dr Barry Dienes    07/30/2019 Pathology Results   FINAL MICROSCOPIC DIAGNOSIS:   A. BREAST, LEFT, LUMPECTOMY:  - Invasive lobular carcinoma, 1 cm, Nottingham grade 2 of 3.  - Margins of resection are not involved (Closest margin: 1 mm, lateral).  - Biopsy site.  - See oncology table.   B. SENTINEL LYMPH NODE, LEFT AXILLARY #1, BIOPSY:  - One lymph node, negative for carcinoma (0/1).   C. SENTINEL LYMPH NODE, LEFT AXILLARY #2, BIOPSY:  - One lymph node, negative for carcinoma (0/1).    08/30/2019 - 09/24/2019 Radiation Therapy   Adjuvant Radiation with Dr Lisbeth Renshaw     09/2019 -  Anti-estrogen oral therapy   Anastrozole 64m daily starting 09/2019      INTERVAL HISTORY:  Molly TARBELLis here for a follow up of breast cancer. She was last seen by me on 09/21/20. She presents to the clinic alone. She reports she is doing well overall with no new complaints. She denies pain or any issues with anastrozole. She does note hot flashes and overeating. She also reports she has recovered from surgery well and has no limitations in range of motion.   All other systems were reviewed with the patient and are negative.  MEDICAL HISTORY:  Past Medical History:  Diagnosis Date   Glaucoma    left eye   Hyperlipidemia    Hypertension    NO MEDS    SURGICAL HISTORY: Past Surgical History:  Procedure Laterality Date   BREAST  LUMPECTOMY WITH RADIOACTIVE SEED AND SENTINEL LYMPH NODE BIOPSY Left 07/30/2019   Procedure: LEFT BREAST LUMPECTOMY WITH RADIOACTIVE SEED AND SENTINEL LYMPH NODE BIOPSY;  Surgeon: BStark Klein MD;  [facility];  Service: General;  Laterality: Left;  REGIONAL FOR POST OP PAIN   no prior surgery     TUBAL LIGATION      I have reviewed the social history and family history with the patient and they are unchanged from previous note.  ALLERGIES:  is allergic to bee venom.  MEDICATIONS:  Current Outpatient Medications  Medication Sig Dispense Refill   anastrozole (ARIMIDEX) 1 MG tablet TAKE 1 TABLET BY MOUTH EVERY DAY 90 tablet 1   atorvastatin (LIPITOR) 20 MG tablet TAKE 1 TABLET BY MOUTH EVERY DAY 90 tablet 3   brimonidine-timolol (COMBIGAN) 0.2-0.5 % ophthalmic solution Place 1 drop into both eyes every 12 (twelve) hours.     dorzolamide (TRUSOPT) 2 % ophthalmic solution Place 1 drop into both eyes 2 (two) times daily.  (Patient not taking: Reported on 12/05/2020)  11   EPINEPHrine 0.3 mg/0.3 mL IJ SOAJ injection Inject 0.3 mg into the muscle as needed  for anaphylaxis.     latanoprost (XALATAN) 0.005 % ophthalmic solution Place 1 drop into both eyes at bedtime.      No current facility-administered medications for this visit.    PHYSICAL EXAMINATION: ECOG PERFORMANCE STATUS: 0 - Asymptomatic  Vitals:   04/20/21 0837  BP: (!) 156/78  Pulse: (!) 58  Resp: 18  Temp: 98.4 F (36.9 C)  SpO2: 98%   Wt Readings from Last 3 Encounters:  04/20/21 202 lb 4.8 oz (91.8 kg)  09/21/20 198 lb 9.6 oz (90.1 kg)  03/24/20 189 lb 6.4 oz (85.9 kg)     GENERAL:alert, no distress and comfortable SKIN: skin color, texture, turgor are normal, no rashes or significant lesions EYES: normal, Conjunctiva are pink and non-injected, sclera clear  NECK: supple, thyroid normal size, non-tender, without nodularity LYMPH:  no palpable lymphadenopathy in the cervical, axillary  LUNGS: clear to  auscultation and percussion with normal breathing effort HEART: regular rate & rhythm and no murmurs and no lower extremity edema ABDOMEN:abdomen soft, non-tender and normal bowel sounds Musculoskeletal:no cyanosis of digits and no clubbing  NEURO: alert & oriented x 3 with fluent speech, no focal motor/sensory deficits BREAST: No palpable mass, nodules or adenopathy bilaterally. Breast exam benign.   LABORATORY DATA:  I have reviewed the data as listed CBC Latest Ref Rng & Units 04/10/2021 09/21/2020 09/03/2020  WBC 4.0 - 10.5 K/uL 3.5(L) 4.1 7.0  Hemoglobin 12.0 - 15.0 g/dL 14.1 13.3 14.1  Hematocrit 36.0 - 46.0 % 41.7 39.2 42.9  Platelets 150 - 400 K/uL 180 173 208     CMP Latest Ref Rng & Units 04/10/2021 09/21/2020 09/03/2020  Glucose 70 - 99 mg/dL 94 91 95  BUN 8 - 23 mg/dL 11 13 16   Creatinine 0.44 - 1.00 mg/dL 0.66 0.73 0.71  Sodium 135 - 145 mmol/L 140 142 139  Potassium 3.5 - 5.1 mmol/L 4.0 4.0 3.9  Chloride 98 - 111 mmol/L 107 111 108  CO2 22 - 32 mmol/L 27 23 25   Calcium 8.9 - 10.3 mg/dL 9.6 9.5 9.4  Total Protein 6.5 - 8.1 g/dL 7.4 7.1 -  Total Bilirubin 0.3 - 1.2 mg/dL 0.8 0.5 -  Alkaline Phos 38 - 126 U/L 70 96 -  AST 15 - 41 U/L 18 19 -  ALT 0 - 44 U/L 22 20 -      RADIOGRAPHIC STUDIES: I have personally reviewed the radiological images as listed and agreed with the findings in the report. No results found.    Orders Placed This Encounter  Procedures   DG Bone Density    Standing Status:   Future    Standing Expiration Date:   04/20/2022    Order Specific Question:   Reason for Exam (SYMPTOM  OR DIAGNOSIS REQUIRED)    Answer:   screening    Order Specific Question:   Preferred imaging location?    Answer:   [facility]   MM DIAG BREAST TOMO BILATERAL    Standing Status:   Future    Standing Expiration Date:   04/21/2022    Order Specific Question:   Reason for Exam (SYMPTOM  OR DIAGNOSIS REQUIRED)    Answer:   screening    Order Specific Question:    Preferred imaging location?    Answer:   [facility]   All questions were answered. The patient knows to call the clinic with any problems, questions or concerns. No barriers to learning was detected. The total time spent in  the appointment was 30 minutes.     Truitt Merle, MD 04/20/2021   I, Wilburn Mylar, am acting as scribe for Truitt Merle, MD.   I have reviewed the above documentation for accuracy and completeness, and I agree with the above.

## 2021-04-21 IMAGING — MG MM PLC BREAST LOC DEV 1ST LESION INC MAMMO GUIDE*L*
7 series · 7 of 7 positions shown · non-contrast
Comparison: Previous exam(s).

CLINICAL DATA: 68-year-old with biopsy-proven grade 2 invasive
mammary carcinoma involving the UPPER OUTER QUADRANT of the LEFT
breast. Radioactive seed localization is performed in anticipation
of lumpectomy which is scheduled for tomorrow.

EXAM:
MAMMOGRAPHIC GUIDED RADIOACTIVE SEED LOCALIZATION OF THE LEFT BREAST

[L CC (1 of 4)]
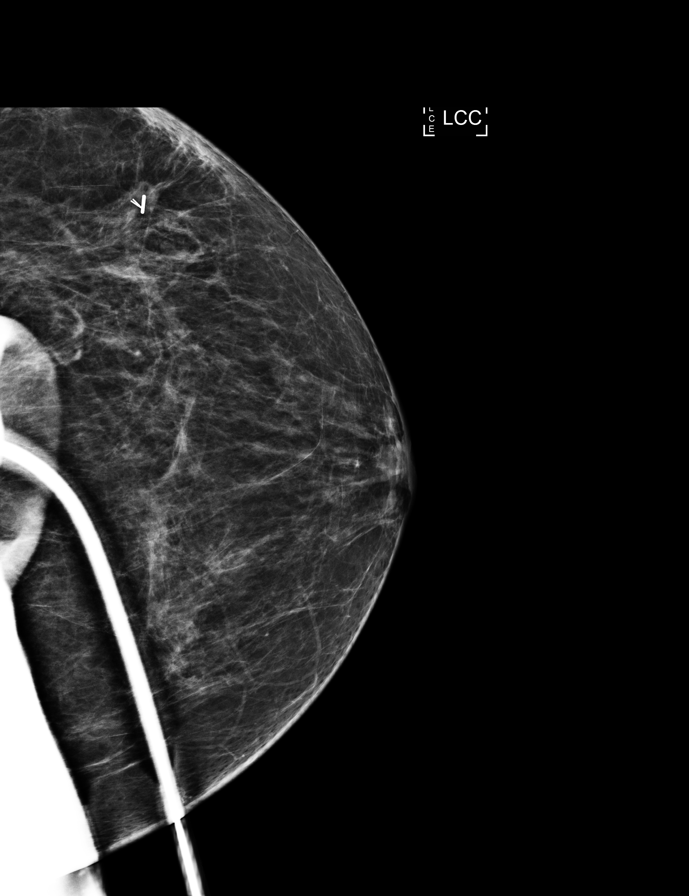

[L LM (1 of 3)]
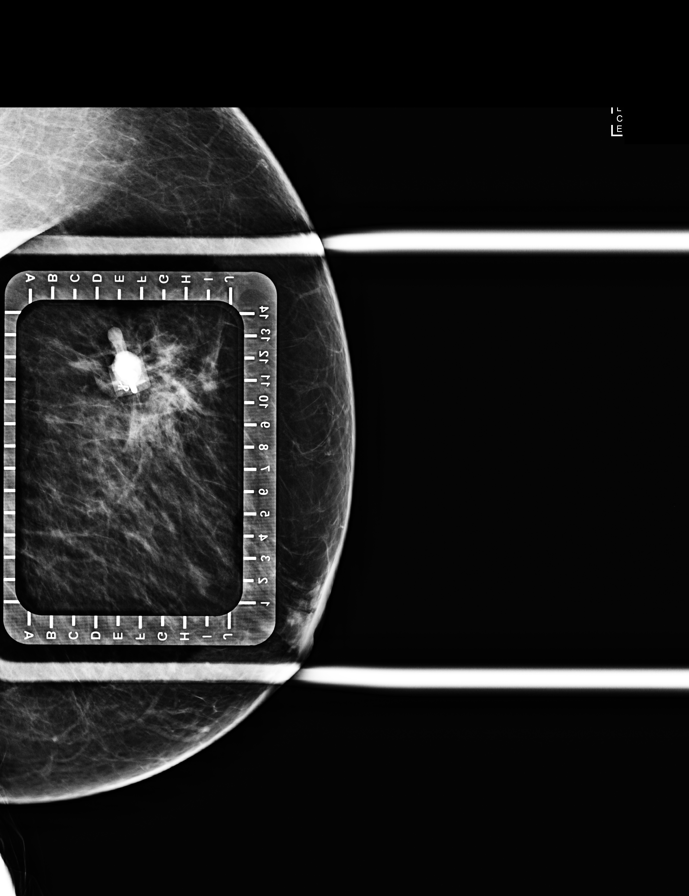

[L LM (2 of 3)]
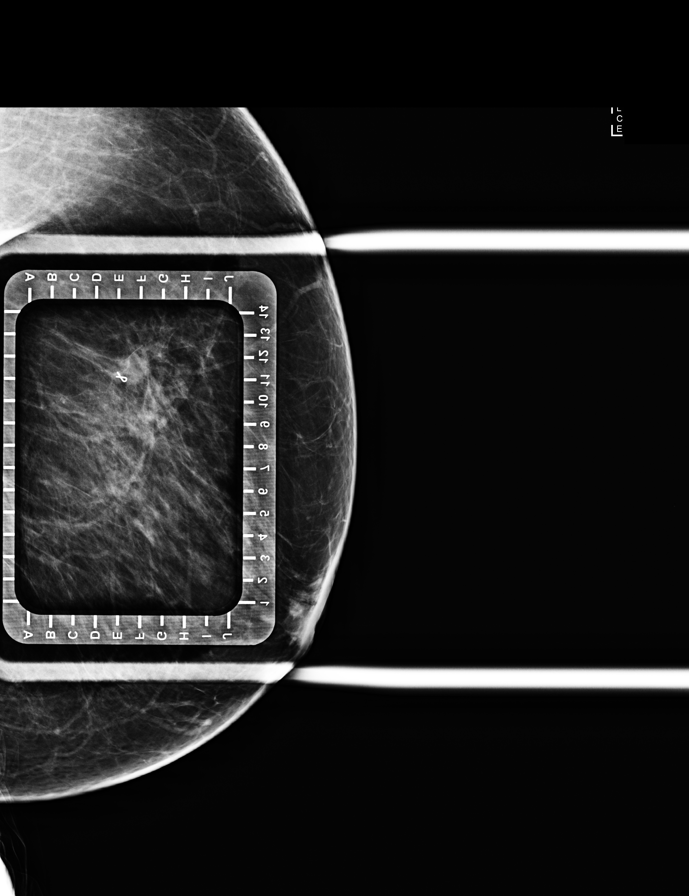

[L LM (3 of 3)]
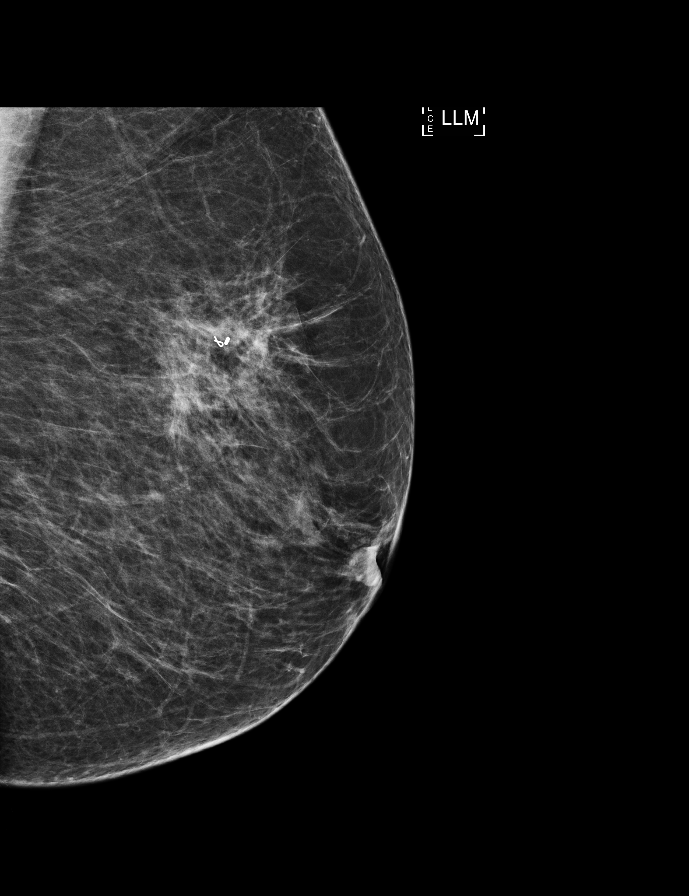

[L CC (2 of 4)]
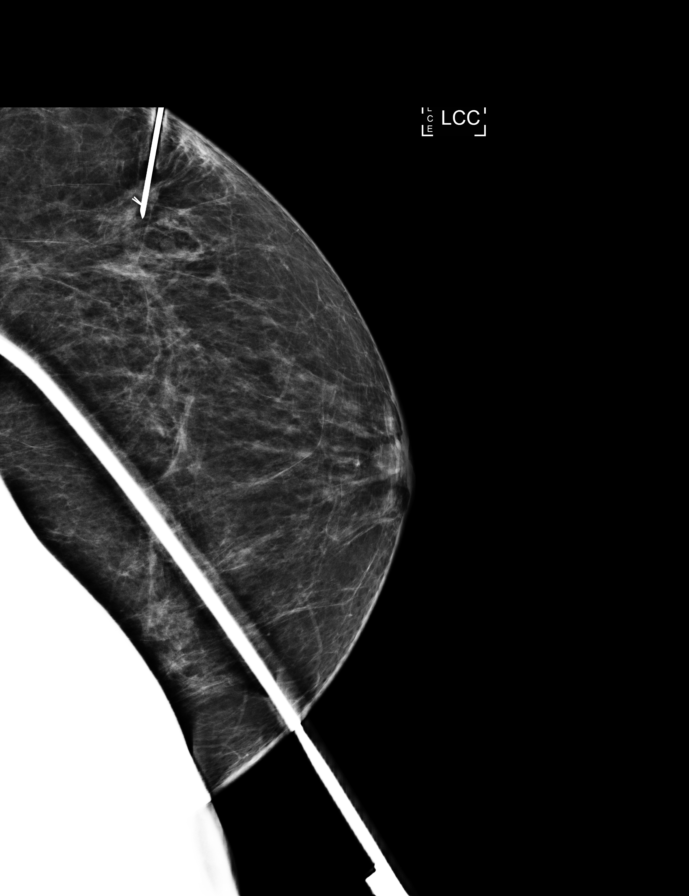

[L CC (3 of 4)]
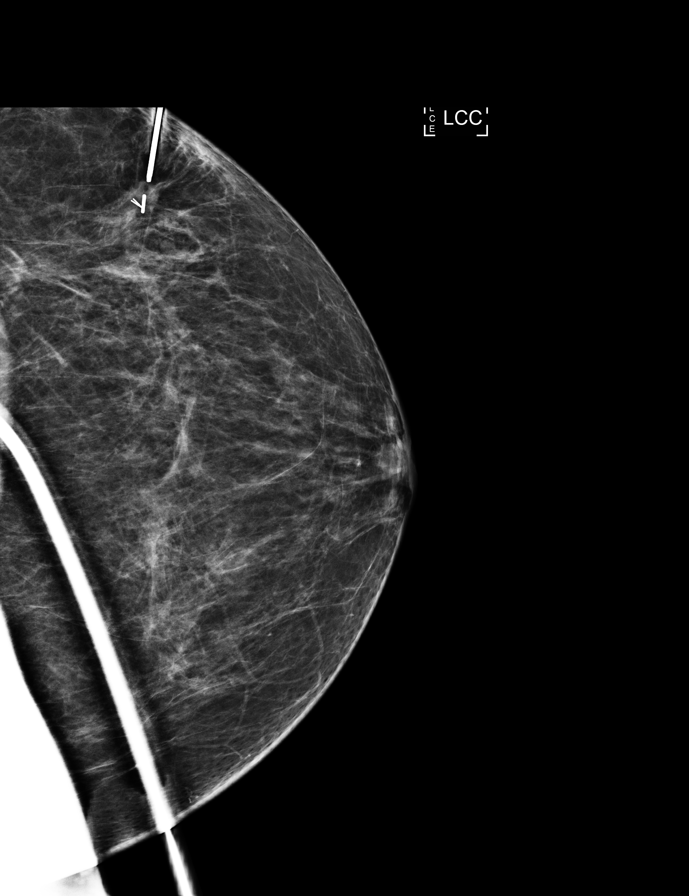

[L CC (4 of 4)]
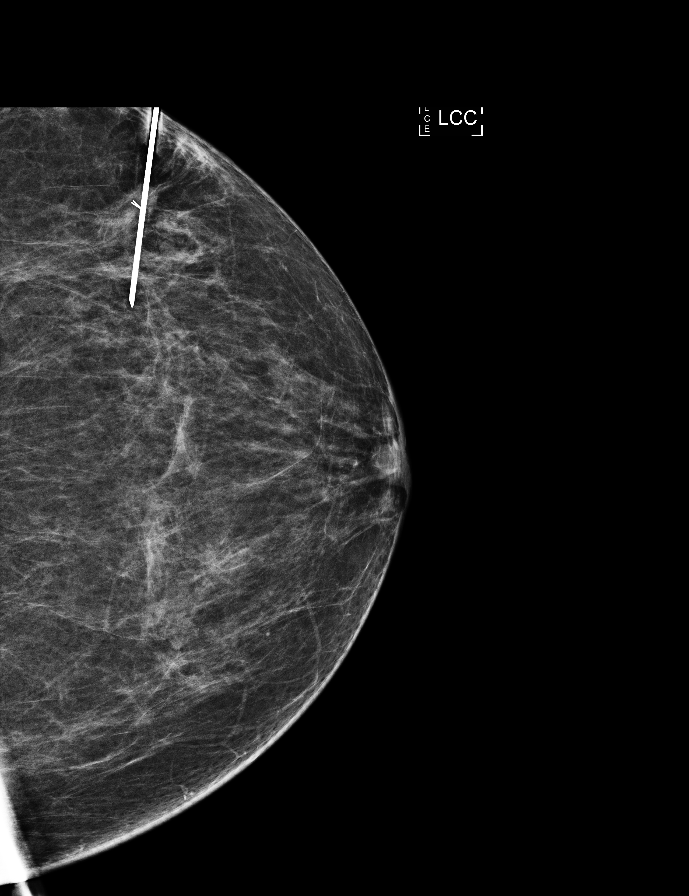

[7 of 7 positions shown; findings below may reference images not displayed]

FINDINGS: Patient presents for radioactive seed localization prior to LEFT
breast lumpectomy. I met with the patient and we discussed the
procedure of seed localization including benefits and alternatives.
We discussed the high likelihood of a successful procedure. We
discussed the risks of the procedure including infection, bleeding,
tissue injury and further surgery. We discussed the low dose of
radioactivity involved in the procedure. Informed, written consent
was given.

The usual time-out protocol was performed immediately prior to the
procedure.

Using mammographic guidance, sterile technique with chlorhexidine as
skin antisepsis, 1% lidocaine as local anesthetic, an P-[id number]
radioactive seed was used to localize the mass and the associated
ribbon shaped tissue marker clip in the UPPER OUTER LEFT breast
using a lateral approach. The follow-up mammogram images confirm the
seed is appropriately positioned within the biopsied mass,
immediately adjacent to the ribbon clip. The images are marked for
Dr. Tiger.

Follow-up survey of the patient confirms the presence of the
radioactive seed.

Order number of P-KR4 seed: [id number]

Total activity: 0.246 mCi

Reference Date: 07/22/2019

The patient tolerated the procedure well and was released from the
[REDACTED]. She was given instructions regarding seed removal.
IMPRESSION: Radioactive seed localization of the biopsy-proven malignancy
involving the UPPER OUTER QUADRANT of the LEFT breast. No apparent
complications.

## 2021-04-22 IMAGING — MG MM BREAST SURGICAL SPECIMEN
1 series · 1 of 1 positions shown · non-contrast
Comparison: Previous exam(s).

CLINICAL DATA: Status post seed localized LEFT lumpectomy.

EXAM:
SPECIMEN RADIOGRAPH OF THE LEFT BREAST

[L]
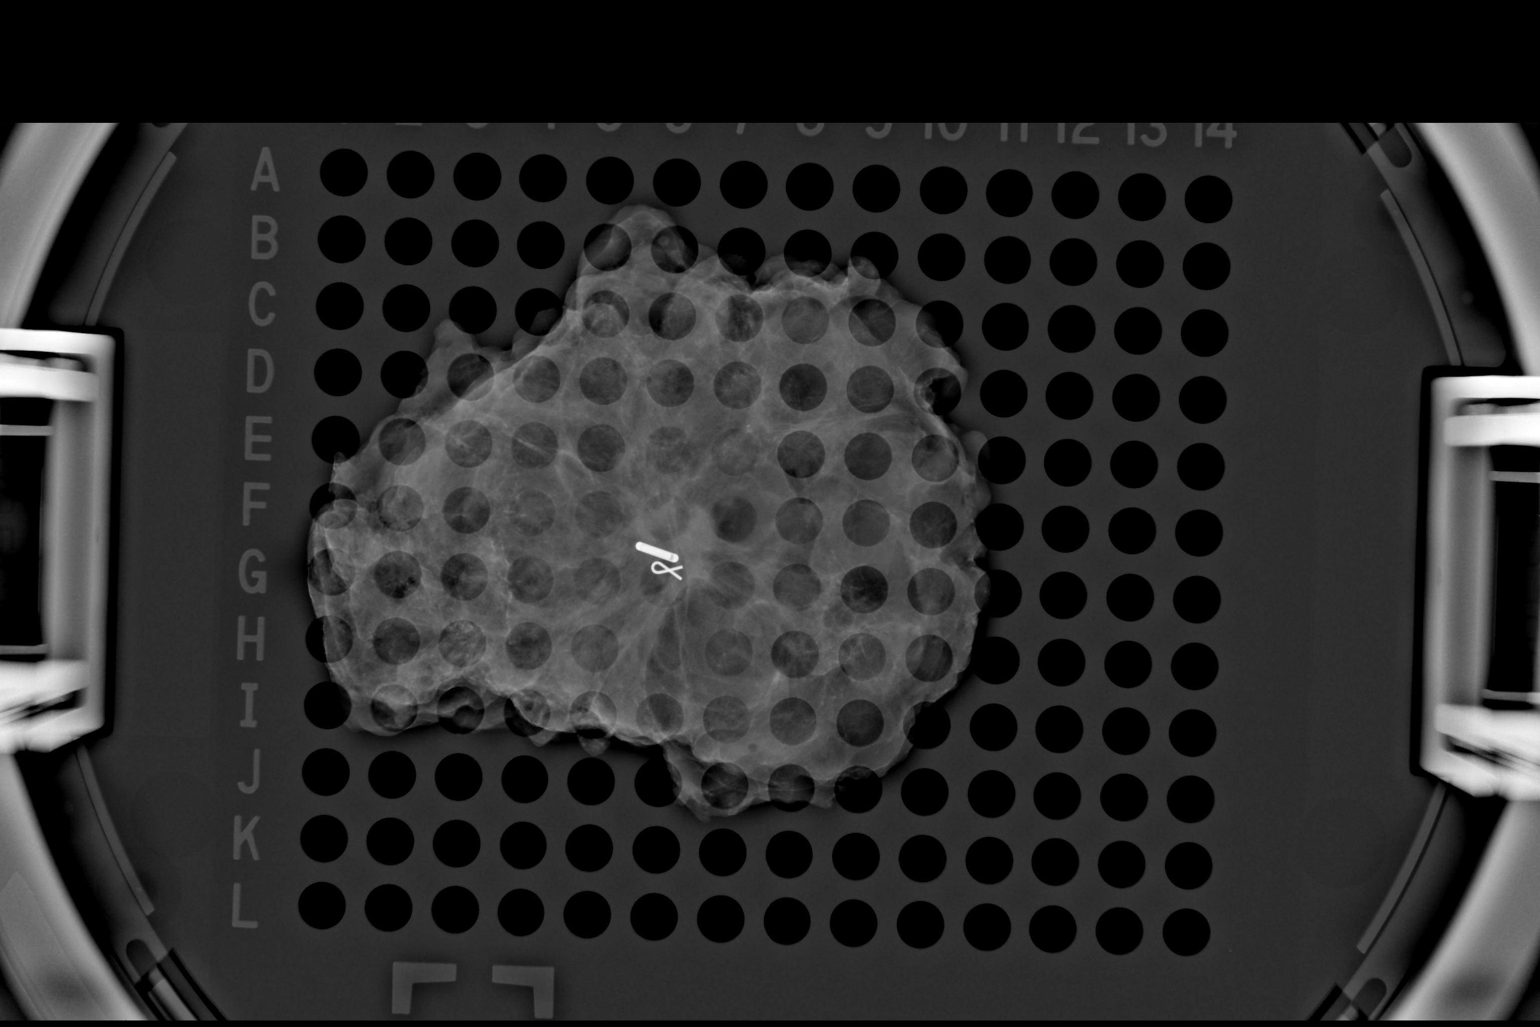

[1 of 1 positions shown; findings below may reference images not displayed]

FINDINGS: Status post excision of the left breast. The radioactive seed and
ribbon shaped biopsy marker clip are present, completely intact, and
were marked for pathology.
IMPRESSION: Specimen radiograph of the LEFT breast.

## 2021-05-08 DIAGNOSIS — H401133 Primary open-angle glaucoma, bilateral, severe stage: Secondary | ICD-10-CM | POA: Diagnosis not present

## 2021-05-08 DIAGNOSIS — H2513 Age-related nuclear cataract, bilateral: Secondary | ICD-10-CM | POA: Diagnosis not present

## 2021-05-30 ENCOUNTER — Ambulatory Visit
Admission: RE | Admit: 2021-05-30 | Discharge: 2021-05-30 | Disposition: A | Discharge status: Home / Self Care | Payer: Medicare Other | Source: Ambulatory Visit | Attending: Hematology | Admitting: Hematology

## 2021-05-30 DIAGNOSIS — Z170 Estrogen receptor positive status [ER+]: Secondary | ICD-10-CM

## 2021-05-30 DIAGNOSIS — R922 Inconclusive mammogram: Secondary | ICD-10-CM | POA: Diagnosis not present

## 2021-06-14 DIAGNOSIS — H401113 Primary open-angle glaucoma, right eye, severe stage: Secondary | ICD-10-CM | POA: Diagnosis not present

## 2021-06-14 DIAGNOSIS — H2511 Age-related nuclear cataract, right eye: Secondary | ICD-10-CM | POA: Diagnosis not present

## 2021-06-28 DIAGNOSIS — H2512 Age-related nuclear cataract, left eye: Secondary | ICD-10-CM | POA: Diagnosis not present

## 2021-06-28 DIAGNOSIS — H401123 Primary open-angle glaucoma, left eye, severe stage: Secondary | ICD-10-CM | POA: Diagnosis not present

## 2021-10-16 NOTE — Progress Notes (Unsigned)
[facility]   [phone] Fax:([phone]   Clinic Follow up Note   Patient Care Team: [place], Betty G, MD as PCP - General (Family Medicine) Clent Jacks, MD (Ophthalmology) Mauro Kaufmann, RN as Oncology Nurse Navigator Rockwell Germany, RN as Oncology Nurse Navigator Stark Klein, MD as Consulting Physician (General Surgery) Truitt Merle, MD as Consulting Physician (Hematology) Kyung Rudd, MD as Consulting Physician (Radiation Oncology) 10/17/2021  CHIEF COMPLAINT: Follow up left breast cancer   SUMMARY OF ONCOLOGIC HISTORY: Oncology History Overview Note  Cancer Staging Malignant neoplasm of upper-outer quadrant of left breast in female, estrogen receptor positive ([place]) Staging form: Breast, AJCC 8th Edition - Clinical stage from 06/15/2019: Stage IA (cT1b, cN0, cM0, G2, ER+, PR-, HER2-) - Signed by Truitt Merle, MD on 06/22/2019 - Pathologic stage from 08/19/2019: Stage IA (pT1b, pN0(sn), cM0, G2, ER+, PR-, HER2-) - Unsigned    Malignant neoplasm of upper-outer quadrant of left breast in female, estrogen receptor positive ([place])  06/14/2019 Mammogram   Diagnostic Mammogram 06/14/19  IMPRESSION: Suspicious mass in the 2 o'clock location of the LEFT breast 5 centimeters from the nipple. Mass is taller than wide and measures 0.8 x 0.7 x 0.8 centimeters.   06/15/2019 Cancer Staging   Staging form: Breast, AJCC 8th Edition - Clinical stage from 06/15/2019: Stage IA (cT1b, cN0, cM0, G2, ER+, PR-, HER2-) - Signed by Truitt Merle, MD on 06/22/2019   06/15/2019 Initial Biopsy   Diagnosis 06/15/19 Breast, left, needle core biopsy, 2 o'clock - INVASIVE MAMMARY CARCINOMA. - SEE COMMENT. Microscopic Comment The carcinoma appears grade 2. The longest span of tumor is 0.7 cm. An E-Cadherin and a breast prognostic profile will be performed and the results reported separately. Dr. Vicente Males has reviewed the case and concurs with this interpretation.    06/15/2019  Receptors her2   PROGNOSTIC INDICATORS 06/15/19 Results: IMMUNOHISTOCHEMICAL AND MORPHOMETRIC ANALYSIS PERFORMED MANUALLY The tumor cells are EQUIVOCAL for Her2 (2+). HER2 by FISH will be preformed and the results reported separately Estrogen Receptor: 100%, POSITIVE, STRONG STAINING INTENSITY Progesterone Receptor: 0%, NEGATIVE Proliferation Marker Ki67: 10% FLUORESCENCE IN-SITU HYBRIDIZATION Results: GROUP 5: HER2 **NEGATIVE**     06/22/2019 Initial Diagnosis   Malignant neoplasm of upper-outer quadrant of left breast in female, estrogen receptor positive ([place])   07/05/2019 Imaging   MRI breast  IMPRESSION: 1. Recently diagnosed left breast malignancy in the upper outer left breast measures 11 x 8 x 9 mm on MRI. 2. No other evidence of left breast malignancy. No evidence of right breast malignancy. No enlarged or abnormal lymph nodes.     07/30/2019 Surgery   LEFT BREAST LUMPECTOMY WITH RADIOACTIVE SEED AND SENTINEL LYMPH NODE BIOPSY by Dr Barry Dienes    07/30/2019 Pathology Results   FINAL MICROSCOPIC DIAGNOSIS:   A. BREAST, LEFT, LUMPECTOMY:  - Invasive lobular carcinoma, 1 cm, Nottingham grade 2 of 3.  - Margins of resection are not involved (Closest margin: 1 mm, lateral).  - Biopsy site.  - See oncology table.   B. SENTINEL LYMPH NODE, LEFT AXILLARY #1, BIOPSY:  - One lymph node, negative for carcinoma (0/1).   C. SENTINEL LYMPH NODE, LEFT AXILLARY #2, BIOPSY:  - One lymph node, negative for carcinoma (0/1).    08/30/2019 - 09/24/2019 Radiation Therapy   Adjuvant Radiation with Dr Lisbeth Renshaw     09/2019 -  Anti-estrogen oral therapy   Anastrozole 35m daily starting 09/2019     CURRENT THERAPY: Anastrozole 1 mg daily, starting 09/2019  INTERVAL HISTORY: Molly Fuller returns for follow up as scheduled. Last seen by Dr. Burr Medico 04/20/21. Mammo 05/30/21 was negative. DEXA is scheduled for 9/12. She continues anastrozole, tolerating well with mild hot flashes.  She has a planter fasciitis  flare from working in the garden, uses a wrap occasionally.  Her main concern is slowed metabolism, she thinks she is not at a healthy weight.  She does her own breast exams and feels firm scar tissue which is unchanged.  Denies new lump/mass, nipple discharge, or skin change.  She takes calcium and vitamin D and stays up-to-date with her routine health checks.  All other systems were reviewed with the patient and are negative.  MEDICAL HISTORY:  Past Medical History:  Diagnosis Date   Glaucoma    left eye   Hyperlipidemia    Hypertension    NO MEDS    SURGICAL HISTORY: Past Surgical History:  Procedure Laterality Date   BREAST BIOPSY Left 2021   BREAST LUMPECTOMY Left 07/30/2019   BREAST LUMPECTOMY WITH RADIOACTIVE SEED AND SENTINEL LYMPH NODE BIOPSY Left 07/30/2019   Procedure: LEFT BREAST LUMPECTOMY WITH RADIOACTIVE SEED AND SENTINEL LYMPH NODE BIOPSY;  Surgeon: Stark Klein, MD;  Location: [facility];  Service: General;  Laterality: Left;  REGIONAL FOR POST OP PAIN   no prior surgery     TUBAL LIGATION      I have reviewed the social history and family history with the patient and they are unchanged from previous note.  ALLERGIES:  is allergic to bee venom.  MEDICATIONS:  Current Outpatient Medications  Medication Sig Dispense Refill   anastrozole (ARIMIDEX) 1 MG tablet Take 1 tablet (1 mg total) by mouth daily. 90 tablet 3   atorvastatin (LIPITOR) 20 MG tablet TAKE 1 TABLET BY MOUTH EVERY DAY 90 tablet 3   brimonidine-timolol (COMBIGAN) 0.2-0.5 % ophthalmic solution Place 1 drop into both eyes every 12 (twelve) hours.     dorzolamide (TRUSOPT) 2 % ophthalmic solution Place 1 drop into both eyes 2 (two) times daily.  (Patient not taking: Reported on 12/05/2020)  11   EPINEPHrine 0.3 mg/0.3 mL IJ SOAJ injection Inject 0.3 mg into the muscle as needed for anaphylaxis.     latanoprost (XALATAN) 0.005 % ophthalmic solution Place 1 drop into both eyes at bedtime.      No current  facility-administered medications for this visit.    PHYSICAL EXAMINATION: ECOG PERFORMANCE STATUS: 1 - Symptomatic but completely ambulatory  Vitals:   10/17/21 0829  BP: (!) 162/65  Pulse: (!) 58  Resp: 17  Temp: 97.8 F (36.6 C)  SpO2: 96%   Filed Weights   10/17/21 0829  Weight: 202 lb 4.8 oz (91.8 kg)    GENERAL:alert, no distress and comfortable SKIN: No rash EYES: sclera clear NECK: Without mass LYMPH:  no palpable cervical or supraclavicular lymphadenopathy  LUNGS:  normal breathing effort HEART:  no lower extremity edema ABDOMEN:abdomen soft, non-tender and normal bowel sounds NEURO: alert & oriented x 3 with fluent speech, no focal motor/sensory deficits Breast exam: Breasts are symmetrical with inverted nipples, no discharge.  S/p left lumpectomy, incisions completely healed with scar tissue inferior to the axillary incision and superior to the breast incision.  No palpable mass or nodularity in either breast or axilla that I could appreciate.  LABORATORY DATA:  I have reviewed the data as listed    Latest Ref Rng & Units 10/17/2021    8:19 AM 04/10/2021    8:05 AM 09/21/2020  7:37 AM  CBC  WBC 4.0 - 10.5 K/uL 4.1  3.5  4.1   Hemoglobin 12.0 - 15.0 g/dL 13.8  14.1  13.3   Hematocrit 36.0 - 46.0 % 39.9  41.7  39.2   Platelets 150 - 400 K/uL 181  180  173         Latest Ref Rng & Units 10/17/2021    8:19 AM 04/10/2021    8:05 AM 09/21/2020    7:37 AM  CMP  Glucose 70 - 99 mg/dL 91  94  91   BUN 8 - 23 mg/dL _0 Creatinine 0.44 - 1.00 mg/dL 0.76  0.66  0.73   Sodium 135 - 145 mmol/L 140  140  142   Potassium 3.5 - 5.1 mmol/L 3.9  4.0  4.0   Chloride 98 - 111 mmol/L 108  107  111   CO2 22 - 32 mmol/L _1 Calcium 8.9 - 10.3 mg/dL 9.7  9.6  9.5   Total Protein 6.5 - 8.1 g/dL 7.0  7.4  7.1   Total Bilirubin 0.3 - 1.2 mg/dL 0.9  0.8  0.5   Alkaline Phos 38 - 126 U/L 67  70  96   AST 15 - 41 U/L _2 ALT 0 - 44 U/L _3 RADIOGRAPHIC STUDIES: I have personally reviewed the radiological images as listed and agreed with the findings in the report. No results found.   ASSESSMENT & PLAN: Molly Fuller is a 71 y.o. female with    1. Malignant neoplasm of upper-outer quadrant of left breast, lobular carcinoma, Stage IA, p(T1bN0M0), ER+, PR-/HER2-, Grade II  -Diagnosed in 05/2019, s/p left lumpectomy with Dr Barry Dienes on 07/30/19 and adjuvant radiation with Dr Lisbeth Renshaw 08/30/19-09/24/19.  -She began adjuvant antiestrogen therapy with Anastrozole in 09/2019. She is tolerating well with mild joint hot flashes. Plan for 7-10 years due to lobular histology, she is in agreement -She is concerned about slow metabolism and maintaining a healthy weight, I recommended a healthy weight loss management referral but she declined.  I encouraged her to eat healthy and remain active. -Molly Fuller is clinically doing well.  Breast exam is benign, labs are unremarkable.  Mammogram 05/2021 was negative.  Overall there is no clinical concern for breast cancer recurrence -She is now over 2 years from initial diagnosis, continue surveillance, self exams, and anastrozole, I refilled -Follow-up in 6 months, or sooner if needed   2. Bone Health -Her 09/2019 DEXA is normal with lowest T-score -0.9 at left hip. -She takes OTC Calcium and Vit D.  I encouraged her to increase walking when the weather allows -Repeat DEXA 10/30/2021, will call with results  Plan: -Recent mammo and today's labs reviewed -Continue breast cancer surveillance and anastrozole, refilled -DEXA scheduled 10/30/2021, will call with results -Next surveillance visit in 6 months, or sooner if needed    All questions were answered. The patient knows to call the clinic with any problems, questions or concerns. No barriers to learning were detected.      Alla Feeling, NP 10/17/21

## 2021-10-17 ENCOUNTER — Other Ambulatory Visit: Payer: Self-pay

## 2021-10-17 ENCOUNTER — Inpatient Hospital Stay (HOSPITAL_BASED_OUTPATIENT_CLINIC_OR_DEPARTMENT_OTHER): Payer: Medicare Other | Admitting: Nurse Practitioner

## 2021-10-17 ENCOUNTER — Encounter: Payer: Self-pay | Admitting: Nurse Practitioner

## 2021-10-17 ENCOUNTER — Inpatient Hospital Stay: Payer: Medicare Other | Attending: Nurse Practitioner

## 2021-10-17 VITALS — BP 162/65 | HR 58 | Temp 97.8°F | Resp 17 | Ht 66.0 in | Wt 202.3 lb

## 2021-10-17 DIAGNOSIS — C50412 Malignant neoplasm of upper-outer quadrant of left female breast: Secondary | ICD-10-CM | POA: Insufficient documentation

## 2021-10-17 DIAGNOSIS — Z170 Estrogen receptor positive status [ER+]: Secondary | ICD-10-CM | POA: Insufficient documentation

## 2021-10-17 DIAGNOSIS — Z923 Personal history of irradiation: Secondary | ICD-10-CM | POA: Insufficient documentation

## 2021-10-17 DIAGNOSIS — Z79899 Other long term (current) drug therapy: Secondary | ICD-10-CM | POA: Insufficient documentation

## 2021-10-17 DIAGNOSIS — Z79811 Long term (current) use of aromatase inhibitors: Secondary | ICD-10-CM | POA: Insufficient documentation

## 2021-10-17 LAB — COMPREHENSIVE METABOLIC PANEL
ALT: 12 U/L (ref 0–44)
AST: 17 U/L (ref 15–41)
Albumin: 4 g/dL (ref 3.5–5.0)
Alkaline Phosphatase: 67 U/L (ref 38–126)
Anion gap: 5 (ref 5–15)
BUN: 12 mg/dL (ref 8–23)
CO2: 27 mmol/L (ref 22–32)
Calcium: 9.7 mg/dL (ref 8.9–10.3)
Chloride: 108 mmol/L (ref 98–111)
Creatinine, Ser: 0.76 mg/dL (ref 0.44–1.00)
GFR, Estimated: >60 mL/min (ref >60)
Glucose, Bld: 91 mg/dL (ref 70–99)
Potassium: 3.9 mmol/L (ref 3.5–5.1)
Sodium: 140 mmol/L (ref 135–145)
Total Bilirubin: 0.9 mg/dL (ref 0.3–1.2)
Total Protein: 7 g/dL (ref 6.5–8.1)

## 2021-10-17 LAB — CBC WITH DIFFERENTIAL/PLATELET
Abs Immature Granulocytes: 0 10*3/uL (ref 0.00–0.07)
Basophils Absolute: 0 10*3/uL (ref 0.0–0.1)
Basophils Relative: 0 %
Eosinophils Absolute: 0.1 10*3/uL (ref 0.0–0.5)
Eosinophils Relative: 2 %
HCT: 39.9 % (ref 36.0–46.0)
Hemoglobin: 13.8 g/dL (ref 12.0–15.0)
Immature Granulocytes: 0 %
Lymphocytes Relative: 36 %
Lymphs Abs: 1.5 10*3/uL (ref 0.7–4.0)
MCH: 29.2 pg (ref 26.0–34.0)
MCHC: 34.6 g/dL (ref 30.0–36.0)
MCV: 84.5 fL (ref 80.0–100.0)
Monocytes Absolute: 0.4 10*3/uL (ref 0.1–1.0)
Monocytes Relative: 9 %
Neutro Abs: 2.2 10*3/uL (ref 1.7–7.7)
Neutrophils Relative %: 53 %
Platelets: 181 10*3/uL (ref 150–400)
RBC: 4.72 MIL/uL (ref 3.87–5.11)
RDW: 12.3 % (ref 11.5–15.5)
WBC: 4.1 10*3/uL (ref 4.0–10.5)
nRBC: 0 % (ref 0.0–0.2)

## 2021-10-17 MED ORDER — ANASTROZOLE 1 MG PO TABS: 1.0000 mg | ORAL_TABLET | Freq: Every day | ORAL | 3 refills | Status: DC

## 2021-10-25 ENCOUNTER — Other Ambulatory Visit: Payer: Medicare Other

## 2021-10-30 ENCOUNTER — Other Ambulatory Visit: Payer: Medicare Other

## 2021-12-04 ENCOUNTER — Telehealth: Payer: Self-pay | Admitting: Family Medicine

## 2021-12-04 NOTE — Telephone Encounter (Signed)
Left message for patient to call back and schedule Medicare Annual Wellness Visit (AWV) either virtually or in office. Left  my jabber number [phone]    Last AWV 12/05/20 please schedule with Nurse Health Adviser   45 min for awv-i and in office appointments 30 min for awv-s  phone/virtual appointments  

## 2021-12-07 DIAGNOSIS — H401133 Primary open-angle glaucoma, bilateral, severe stage: Secondary | ICD-10-CM | POA: Diagnosis not present

## 2021-12-07 DIAGNOSIS — Z961 Presence of intraocular lens: Secondary | ICD-10-CM | POA: Diagnosis not present

## 2022-01-01 ENCOUNTER — Telehealth: Payer: Self-pay | Admitting: Family Medicine

## 2022-01-01 NOTE — Telephone Encounter (Signed)
Left message for patient to call back and schedule Medicare Annual Wellness Visit (AWV) either virtually or in office. Left  my Herbie Drape number [phone]    Last AWV 12/05/20 please schedule with Nurse Health Adviser   45 min for awv-i and in office appointments 30 min for awv-s  phone/virtual appointments

## 2022-01-31 ENCOUNTER — Other Ambulatory Visit: Payer: Self-pay | Admitting: Family Medicine

## 2022-01-31 DIAGNOSIS — E785 Hyperlipidemia, unspecified: Secondary | ICD-10-CM

## 2022-02-04 ENCOUNTER — Telehealth: Payer: Self-pay | Admitting: Family Medicine

## 2022-02-04 NOTE — Telephone Encounter (Signed)
Left message for patient to call back and schedule Medicare Annual Wellness Visit (AWV) either virtually or in office. Left  my Herbie Drape number [phone]   Last AWV 12/05/20 please schedule with Nurse Health Adviser   45 min for awv-i and in office appointments 30 min for awv-s  phone/virtual appointments

## 2022-04-02 ENCOUNTER — Telehealth: Payer: Self-pay | Admitting: Hematology

## 2022-04-02 NOTE — Telephone Encounter (Signed)
Contacted patient to scheduled appointments. Patient is aware of appointments that are scheduled.   

## 2022-04-03 ENCOUNTER — Other Ambulatory Visit: Payer: Self-pay | Admitting: Family Medicine

## 2022-04-03 DIAGNOSIS — E785 Hyperlipidemia, unspecified: Secondary | ICD-10-CM

## 2022-04-08 ENCOUNTER — Ambulatory Visit
Admission: RE | Admit: 2022-04-08 | Discharge: 2022-04-08 | Disposition: A | Discharge status: Home / Self Care | Payer: Medicare Other | Source: Ambulatory Visit | Attending: Hematology | Admitting: Hematology

## 2022-04-08 ENCOUNTER — Other Ambulatory Visit: Payer: Self-pay | Admitting: Family Medicine

## 2022-04-08 DIAGNOSIS — Z780 Asymptomatic menopausal state: Secondary | ICD-10-CM | POA: Diagnosis not present

## 2022-04-08 DIAGNOSIS — E785 Hyperlipidemia, unspecified: Secondary | ICD-10-CM

## 2022-04-08 DIAGNOSIS — M85851 Other specified disorders of bone density and structure, right thigh: Secondary | ICD-10-CM | POA: Diagnosis not present

## 2022-04-08 DIAGNOSIS — E2839 Other primary ovarian failure: Secondary | ICD-10-CM

## 2022-04-10 ENCOUNTER — Telehealth: Payer: Self-pay | Admitting: Family Medicine

## 2022-04-10 NOTE — Telephone Encounter (Signed)
Called patient to schedule Medicare Annual Wellness Visit (AWV). Left message for patient to call back and schedule Medicare Annual Wellness Visit (AWV).  Last date of AWV: 12/05/20  Please schedule an appointment at any time with [facility] beverly or hannah kim.  If any questions, please contact me at [phone].  Thank you ,  Barkley Boards AWV direct phone # [phone]

## 2022-04-11 ENCOUNTER — Other Ambulatory Visit: Payer: Self-pay

## 2022-04-11 DIAGNOSIS — Z170 Estrogen receptor positive status [ER+]: Secondary | ICD-10-CM

## 2022-04-14 NOTE — Progress Notes (Unsigned)
Patient Care Team: [place], Betty G, MD as PCP - General (Family Medicine) Clent Jacks, MD (Ophthalmology) Mauro Kaufmann, RN as Oncology Nurse Navigator Rockwell Germany, RN as Oncology Nurse Navigator Stark Klein, MD as Consulting Physician (General Surgery) Truitt Merle, MD as Consulting Physician (Hematology) Kyung Rudd, MD as Consulting Physician (Radiation Oncology)   CHIEF COMPLAINT: Follow up left breast cancer   Oncology History Overview Note  Cancer Staging Malignant neoplasm of upper-outer quadrant of left breast in female, estrogen receptor positive [facility]) Staging form: Breast, AJCC 8th Edition - Clinical stage from 06/15/2019: Stage IA (cT1b, cN0, cM0, G2, ER+, PR-, HER2-) - Signed by Truitt Merle, MD on 06/22/2019 - Pathologic stage from 08/19/2019: Stage IA (pT1b, pN0(sn), cM0, G2, ER+, PR-, HER2-) - Unsigned    Malignant neoplasm of upper-outer quadrant of left breast in female, estrogen receptor positive ([place])  06/14/2019 Mammogram   Diagnostic Mammogram 06/14/19  IMPRESSION: Suspicious mass in the 2 o'clock location of the LEFT breast 5 centimeters from the nipple. Mass is taller than wide and measures 0.8 x 0.7 x 0.8 centimeters.   06/15/2019 Cancer Staging   Staging form: Breast, AJCC 8th Edition - Clinical stage from 06/15/2019: Stage IA (cT1b, cN0, cM0, G2, ER+, PR-, HER2-) - Signed by Truitt Merle, MD on 06/22/2019   06/15/2019 Initial Biopsy   Diagnosis 06/15/19 Breast, left, needle core biopsy, 2 o'clock - INVASIVE MAMMARY CARCINOMA. - SEE COMMENT. Microscopic Comment The carcinoma appears grade 2. The longest span of tumor is 0.7 cm. An E-Cadherin and a breast prognostic profile will be performed and the results reported separately. Dr. Vicente Males has reviewed the case and concurs with this interpretation.    06/15/2019 Receptors her2   PROGNOSTIC INDICATORS 06/15/19 Results: IMMUNOHISTOCHEMICAL AND MORPHOMETRIC ANALYSIS PERFORMED MANUALLY The tumor cells are  EQUIVOCAL for Her2 (2+). HER2 by FISH will be preformed and the results reported separately Estrogen Receptor: 100%, POSITIVE, STRONG STAINING INTENSITY Progesterone Receptor: 0%, NEGATIVE Proliferation Marker Ki67: 10% FLUORESCENCE IN-SITU HYBRIDIZATION Results: GROUP 5: HER2 **NEGATIVE**     06/22/2019 Initial Diagnosis   Malignant neoplasm of upper-outer quadrant of left breast in female, estrogen receptor positive ([place])   07/05/2019 Imaging   MRI breast  IMPRESSION: 1. Recently diagnosed left breast malignancy in the upper outer left breast measures 11 x 8 x 9 mm on MRI. 2. No other evidence of left breast malignancy. No evidence of right breast malignancy. No enlarged or abnormal lymph nodes.     07/30/2019 Surgery   LEFT BREAST LUMPECTOMY WITH RADIOACTIVE SEED AND SENTINEL LYMPH NODE BIOPSY by Dr Barry Dienes    07/30/2019 Pathology Results   FINAL MICROSCOPIC DIAGNOSIS:   A. BREAST, LEFT, LUMPECTOMY:  - Invasive lobular carcinoma, 1 cm, Nottingham grade 2 of 3.  - Margins of resection are not involved (Closest margin: 1 mm, lateral).  - Biopsy site.  - See oncology table.   B. SENTINEL LYMPH NODE, LEFT AXILLARY #1, BIOPSY:  - One lymph node, negative for carcinoma (0/1).   C. SENTINEL LYMPH NODE, LEFT AXILLARY #2, BIOPSY:  - One lymph node, negative for carcinoma (0/1).    08/30/2019 - 09/24/2019 Radiation Therapy   Adjuvant Radiation with Dr Lisbeth Renshaw     09/2019 -  Anti-estrogen oral therapy   Anastrozole '1mg'$  daily starting 09/2019      CURRENT THERAPY: Anastrozole 1 mg daily starting 09/2019  INTERVAL HISTORY Ms. Dicioccio returns for follow up as scheduled. Last seen by me 10/17/21. She continues anastrozole.  DEXA 04/08/22 showed mild osteopenia -1.1, with low frax score.   ROS   Past Medical History:  Diagnosis Date   Glaucoma    left eye   Hyperlipidemia    Hypertension    NO MEDS     Past Surgical History:  Procedure Laterality Date   BREAST BIOPSY Left 2021    BREAST LUMPECTOMY Left 07/30/2019   BREAST LUMPECTOMY WITH RADIOACTIVE SEED AND SENTINEL LYMPH NODE BIOPSY Left 07/30/2019   Procedure: LEFT BREAST LUMPECTOMY WITH RADIOACTIVE SEED AND SENTINEL LYMPH NODE BIOPSY;  Surgeon: Stark Klein, MD;  Location: [facility];  Service: General;  Laterality: Left;  REGIONAL FOR POST OP PAIN   no prior surgery     TUBAL LIGATION       Outpatient Encounter Medications as of 04/15/2022  Medication Sig   anastrozole (ARIMIDEX) 1 MG tablet Take 1 tablet (1 mg total) by mouth daily.   atorvastatin (LIPITOR) 20 MG tablet TAKE 1 TABLET BY MOUTH EVERY DAY   brimonidine-timolol (COMBIGAN) 0.2-0.5 % ophthalmic solution Place 1 drop into both eyes every 12 (twelve) hours.   dorzolamide (TRUSOPT) 2 % ophthalmic solution Place 1 drop into both eyes 2 (two) times daily.  (Patient not taking: Reported on 12/05/2020)   EPINEPHrine 0.3 mg/0.3 mL IJ SOAJ injection Inject 0.3 mg into the muscle as needed for anaphylaxis.   latanoprost (XALATAN) 0.005 % ophthalmic solution Place 1 drop into both eyes at bedtime.    No facility-administered encounter medications on file as of 04/15/2022.     There were no vitals filed for this visit. There is no height or weight on file to calculate BMI.   PHYSICAL EXAM GENERAL:alert, no distress and comfortable SKIN: no rash  EYES: sclera clear NECK: without mass LYMPH:  no palpable cervical or supraclavicular lymphadenopathy  LUNGS: clear with normal breathing effort HEART: regular rate & rhythm, no lower extremity edema ABDOMEN: abdomen soft, non-tender and normal bowel sounds NEURO: alert & oriented x 3 with fluent speech, no focal motor/sensory deficits Breast exam:  PAC without erythema    CBC    Component Value Date/Time   WBC 4.1 10/17/2021 0819   RBC 4.72 10/17/2021 0819   HGB 13.8 10/17/2021 0819   HGB 13.3 09/21/2020 0737   HCT 39.9 10/17/2021 0819   PLT 181 10/17/2021 0819   PLT 173 09/21/2020 0737   MCV 84.5  10/17/2021 0819   MCH 29.2 10/17/2021 0819   MCHC 34.6 10/17/2021 0819   RDW 12.3 10/17/2021 0819   LYMPHSABS 1.5 10/17/2021 0819   MONOABS 0.4 10/17/2021 0819   EOSABS 0.1 10/17/2021 0819   BASOSABS 0.0 10/17/2021 0819     CMP     Component Value Date/Time   NA 140 10/17/2021 0819   K 3.9 10/17/2021 0819   CL 108 10/17/2021 0819   CO2 27 10/17/2021 0819   GLUCOSE 91 10/17/2021 0819   BUN 12 10/17/2021 0819   CREATININE 0.76 10/17/2021 0819   CREATININE 0.73 09/21/2020 0737   CALCIUM 9.7 10/17/2021 0819   PROT 7.0 10/17/2021 0819   ALBUMIN 4.0 10/17/2021 0819   AST 17 10/17/2021 0819   AST 19 09/21/2020 0737   ALT 12 10/17/2021 0819   ALT 20 09/21/2020 0737   ALKPHOS 67 10/17/2021 0819   BILITOT 0.9 10/17/2021 0819   BILITOT 0.5 09/21/2020 0737   GFRNONAA >60 10/17/2021 0819   GFRNONAA >60 09/21/2020 0737   GFRAA >60 07/22/2019 0922   GFRAA >60 06/23/2019 1212  ASSESSMENT & PLAN:  PLAN:  No orders of the defined types were placed in this encounter.     All questions were answered. The patient knows to call the clinic with any problems, questions or concerns. No barriers to learning were detected. I spent *** counseling the patient face to face. The total time spent in the appointment was *** and more than 50% was on counseling, review of test results, and coordination of care.   Cira Rue, NP-C '@DATE'$ @

## 2022-04-15 ENCOUNTER — Encounter: Payer: Self-pay | Admitting: Nurse Practitioner

## 2022-04-15 ENCOUNTER — Inpatient Hospital Stay: Payer: Medicare Other | Attending: Hematology

## 2022-04-15 ENCOUNTER — Other Ambulatory Visit: Payer: Medicare Other

## 2022-04-15 ENCOUNTER — Ambulatory Visit: Payer: Medicare Other | Admitting: Hematology

## 2022-04-15 ENCOUNTER — Inpatient Hospital Stay (HOSPITAL_BASED_OUTPATIENT_CLINIC_OR_DEPARTMENT_OTHER): Payer: Medicare Other | Admitting: Nurse Practitioner

## 2022-04-15 DIAGNOSIS — C50412 Malignant neoplasm of upper-outer quadrant of left female breast: Secondary | ICD-10-CM | POA: Insufficient documentation

## 2022-04-15 DIAGNOSIS — M8580 Other specified disorders of bone density and structure, unspecified site: Secondary | ICD-10-CM | POA: Insufficient documentation

## 2022-04-15 DIAGNOSIS — Z170 Estrogen receptor positive status [ER+]: Secondary | ICD-10-CM | POA: Diagnosis not present

## 2022-04-15 DIAGNOSIS — Z79899 Other long term (current) drug therapy: Secondary | ICD-10-CM | POA: Insufficient documentation

## 2022-04-15 DIAGNOSIS — Z79811 Long term (current) use of aromatase inhibitors: Secondary | ICD-10-CM | POA: Diagnosis not present

## 2022-04-15 DIAGNOSIS — Z961 Presence of intraocular lens: Secondary | ICD-10-CM | POA: Diagnosis not present

## 2022-04-15 DIAGNOSIS — Z923 Personal history of irradiation: Secondary | ICD-10-CM | POA: Insufficient documentation

## 2022-04-15 DIAGNOSIS — H401133 Primary open-angle glaucoma, bilateral, severe stage: Secondary | ICD-10-CM | POA: Diagnosis not present

## 2022-04-15 LAB — CMP (CANCER CENTER ONLY)
ALT: 18 U/L (ref 0–44)
AST: 18 U/L (ref 15–41)
Albumin: 4 g/dL (ref 3.5–5.0)
Alkaline Phosphatase: 71 U/L (ref 38–126)
Anion gap: 7 (ref 5–15)
BUN: 12 mg/dL (ref 8–23)
CO2: 25 mmol/L (ref 22–32)
Calcium: 9 mg/dL (ref 8.9–10.3)
Chloride: 106 mmol/L (ref 98–111)
Creatinine: 0.66 mg/dL (ref 0.44–1.00)
GFR, Estimated: >60 mL/min (ref >60)
Glucose, Bld: 84 mg/dL (ref 70–99)
Potassium: 3.9 mmol/L (ref 3.5–5.1)
Sodium: 138 mmol/L (ref 135–145)
Total Bilirubin: 0.9 mg/dL (ref 0.3–1.2)
Total Protein: 7.3 g/dL (ref 6.5–8.1)

## 2022-04-15 LAB — CBC WITH DIFFERENTIAL (CANCER CENTER ONLY)
Abs Immature Granulocytes: 0.01 10*3/uL (ref 0.00–0.07)
Basophils Absolute: 0 10*3/uL (ref 0.0–0.1)
Basophils Relative: 1 %
Eosinophils Absolute: 0 10*3/uL (ref 0.0–0.5)
Eosinophils Relative: 1 %
HCT: 40.1 % (ref 36.0–46.0)
Hemoglobin: 14.1 g/dL (ref 12.0–15.0)
Immature Granulocytes: 0 %
Lymphocytes Relative: 41 %
Lymphs Abs: 1.6 10*3/uL (ref 0.7–4.0)
MCH: 29.5 pg (ref 26.0–34.0)
MCHC: 35.2 g/dL (ref 30.0–36.0)
MCV: 83.9 fL (ref 80.0–100.0)
Monocytes Absolute: 0.3 10*3/uL (ref 0.1–1.0)
Monocytes Relative: 8 %
Neutro Abs: 2 10*3/uL (ref 1.7–7.7)
Neutrophils Relative %: 49 %
Platelet Count: 158 10*3/uL (ref 150–400)
RBC: 4.78 MIL/uL (ref 3.87–5.11)
RDW: 12.1 % (ref 11.5–15.5)
WBC Count: 4 10*3/uL (ref 4.0–10.5)
nRBC: 0 % (ref 0.0–0.2)

## 2022-04-15 MED ORDER — ANASTROZOLE 1 MG PO TABS: 1.0000 mg | ORAL_TABLET | Freq: Every day | ORAL | 3 refills | Status: DC

## 2022-04-22 ENCOUNTER — Encounter: Payer: Medicare Other | Admitting: Family Medicine

## 2022-05-03 NOTE — Progress Notes (Unsigned)
HPI: Molly Fuller is a 72 y.o. female, who is here today for her routine physical.  Last CPE: ***  Regular exercise 3 or more time per week: *** Following a healthy diet: ***  Chronic medical problems: ***  Immunization History  Administered Date(s) Administered   PFIZER(Purple Top)SARS-COV-2 Vaccination 03/15/2019, 04/05/2019   Td 02/18/2002, 02/19/2008   Health Maintenance  Topic Date Due   Zoster Vaccines- Shingrix (1 of 2) Never done   Pneumonia Vaccine 34+ Years old (1 of 1 - PCV) Never done   DTaP/Tdap/Td (3 - Tdap) 02/18/2018   COVID-19 Vaccine (3 - Pfizer risk series) 05/03/2019   Medicare Annual Wellness (AWV)  12/05/2021   COLONOSCOPY (Pts 45-44yrs Insurance coverage will need to be confirmed)  05/28/2022   MAMMOGRAM  05/31/2022   DEXA SCAN  Completed   HPV VACCINES  Aged Out   INFLUENZA VACCINE  Discontinued   Hepatitis C Screening  Discontinued    She has *** concerns today.  Review of Systems  Current Outpatient Medications on File Prior to Visit  Medication Sig Dispense Refill   anastrozole (ARIMIDEX) 1 MG tablet Take 1 tablet (1 mg total) by mouth daily. 90 tablet 3   atorvastatin (LIPITOR) 20 MG tablet TAKE 1 TABLET BY MOUTH EVERY DAY 30 tablet 0   brimonidine-timolol (COMBIGAN) 0.2-0.5 % ophthalmic solution Place 1 drop into both eyes every 12 (twelve) hours.     dorzolamide (TRUSOPT) 2 % ophthalmic solution Place 1 drop into both eyes 2 (two) times daily.  11   EPINEPHrine 0.3 mg/0.3 mL IJ SOAJ injection Inject 0.3 mg into the muscle as needed for anaphylaxis.     latanoprost (XALATAN) 0.005 % ophthalmic solution Place 1 drop into both eyes at bedtime.      No current facility-administered medications on file prior to visit.    Past Medical History:  Diagnosis Date   Glaucoma    left eye   Hyperlipidemia    Hypertension    NO MEDS    Past Surgical History:  Procedure Laterality Date   BREAST BIOPSY Left 2021   BREAST LUMPECTOMY  Left 07/30/2019   BREAST LUMPECTOMY WITH RADIOACTIVE SEED AND SENTINEL LYMPH NODE BIOPSY Left 07/30/2019   Procedure: LEFT BREAST LUMPECTOMY WITH RADIOACTIVE SEED AND SENTINEL LYMPH NODE BIOPSY;  Surgeon: Stark Klein, MD;  Location: [facility];  Service: General;  Laterality: Left;  REGIONAL FOR POST OP PAIN   no prior surgery     TUBAL LIGATION      Allergies  Allergen Reactions   Bee Venom Swelling    Family History  Problem Relation Age of Onset   Colon cancer Paternal Grandmother 39   Hypertension Other    Diabetes Other    Breast cancer Neg Hx     Social History   Socioeconomic History   Marital status: Married    Spouse name: Not on file   Number of children: 2   Years of education: Not on file   Highest education level: Not on file  Occupational History   Occupation: retired Associate Professor   Tobacco Use   Smoking status: Never   Smokeless tobacco: Never  Substance and Sexual Activity   Alcohol use: No   Drug use: No   Sexual activity: Not on file  Other Topics Concern   Not on file  Social History Narrative   Not on file   Social Determinants of Health   Financial Resource Strain: Low Risk  (12/05/2020)  Overall Financial Resource Strain (CARDIA)    Difficulty of Paying Living Expenses: Not hard at all  Food Insecurity: No Food Insecurity (12/05/2020)   Hunger Vital Sign    Worried About Running Out of Food in the Last Year: Never true    Ran Out of Food in the Last Year: Never true  Transportation Needs: No Transportation Needs (12/05/2020)   PRAPARE - Hydrologist (Medical): No    Lack of Transportation (Non-Medical): No  Physical Activity: Insufficiently Active (12/05/2020)   Exercise Vital Sign    Days of Exercise per Week: 3 days    Minutes of Exercise per Session: 20 min  Stress: No Stress Concern Present (12/05/2020)   [place]    Feeling of Stress  : Not at all  Social Connections: Moderately Integrated (12/05/2020)   Social Connection and Isolation Panel [NHANES]    Frequency of Communication with Friends and Family: Twice a week    Frequency of Social Gatherings with Friends and Family: Twice a week    Attends Religious Services: More than 4 times per year    Active Member of [employer] or Organizations: No    Attends Archivist Meetings: Never    Marital Status: Married    There were no vitals filed for this visit. There is no height or weight on file to calculate BMI.  Wt Readings from Last 3 Encounters:  04/15/22 203 lb 8 oz (92.3 kg)  10/17/21 202 lb 4.8 oz (91.8 kg)  04/20/21 202 lb 4.8 oz (91.8 kg)    Physical Exam  ASSESSMENT AND PLAN: Molly Fuller was here today annual physical examination.  No orders of the defined types were placed in this encounter.   There are no diagnoses linked to this encounter.  There are no diagnoses linked to this encounter.  No follow-ups on file.  Anastyn Ayars G. [place], MD  [facility] [place] office.

## 2022-05-06 ENCOUNTER — Ambulatory Visit (INDEPENDENT_AMBULATORY_CARE_PROVIDER_SITE_OTHER): Payer: Medicare Other | Admitting: Family Medicine

## 2022-05-06 ENCOUNTER — Encounter: Payer: Self-pay | Admitting: Family Medicine

## 2022-05-06 ENCOUNTER — Encounter: Payer: Self-pay | Admitting: Internal Medicine

## 2022-05-06 VITALS — BP 128/72 | HR 64 | Temp 98.1°F | Resp 16 | Ht 66.0 in | Wt 206.5 lb

## 2022-05-06 DIAGNOSIS — E6609 Other obesity due to excess calories: Secondary | ICD-10-CM

## 2022-05-06 DIAGNOSIS — Z1211 Encounter for screening for malignant neoplasm of colon: Secondary | ICD-10-CM | POA: Diagnosis not present

## 2022-05-06 DIAGNOSIS — Z6830 Body mass index (BMI) 30.0-30.9, adult: Secondary | ICD-10-CM | POA: Diagnosis not present

## 2022-05-06 DIAGNOSIS — I519 Heart disease, unspecified: Secondary | ICD-10-CM

## 2022-05-06 DIAGNOSIS — Z0000 Encounter for general adult medical examination without abnormal findings: Secondary | ICD-10-CM

## 2022-05-06 DIAGNOSIS — E785 Hyperlipidemia, unspecified: Secondary | ICD-10-CM | POA: Diagnosis not present

## 2022-05-06 LAB — LIPID PANEL
Cholesterol: 184 mg/dL (ref 0–200)
HDL: 61 mg/dL (ref >39.00)
LDL Cholesterol: 110 mg/dL — ABNORMAL HIGH (ref 0–99)
NonHDL: 123.28
Total CHOL/HDL Ratio: 3
Triglycerides: 67 mg/dL (ref 0.0–149.0)
VLDL: 13.4 mg/dL (ref 0.0–40.0)

## 2022-05-06 MED ORDER — ATORVASTATIN CALCIUM 20 MG PO TABS: 20.0000 mg | ORAL_TABLET | Freq: Every day | ORAL | 3 refills | Status: DC

## 2022-05-06 NOTE — Patient Instructions (Addendum)
  Ms. Bezanson , Thank you for taking time to come for your Medicare Wellness Visit. I appreciate your ongoing commitment to your health goals. Please review the following plan we discussed and let me know if I can assist you in the future.   These are the goals we discussed:  Goals       Acknowledge receipt of Advanced Directive package     Power of attorney and living will needed.      DIET - REDUCE FAST FOOD INTAKE     Exercise 150 min/wk Moderate Activity     Walk 15-30 min daily.        This is a list of the screening recommended for you and due dates:  Health Maintenance  Topic Date Due   COVID-19 Vaccine (3 - Pfizer risk series) 05/03/2019   Colon Cancer Screening  05/28/2022   Mammogram  05/31/2022   Medicare Annual Wellness Visit  05/06/2023   DEXA scan (bone density measurement)  Completed   HPV Vaccine  Aged Out   DTaP/Tdap/Td vaccine  Discontinued   Pneumonia Vaccine  Discontinued   Flu Shot  Discontinued   Hepatitis C Screening: USPSTF Recommendation to screen - Ages 74-74 yo.  Discontinued   Zoster (Shingles) Vaccine  Discontinued   A few things to remember from today's visit:  Medicare annual wellness visit, subsequent  Hyperlipidemia, unspecified hyperlipidemia type - Plan: Lipid panel  Colon cancer screening - Plan: Ambulatory referral to Gastroenterology  If you need refills for medications you take chronically, please call your pharmacy. Do not use My Chart to request refills or for acute issues that need immediate attention. If you send a my chart message, it may take a few days to be addressed, specially if I am not in the office.  Please be sure medication list is accurate. If a new problem present, please set up appointment sooner than planned today.

## 2022-05-06 NOTE — Assessment & Plan Note (Addendum)
Her wt has been otherwise stable. She understands the benefits of wt loss as well as adverse effects of obesity. Consistency with healthy diet and physical activity encouraged. Avoiding fast foods and to having healthier choices available for when she craves for sweets will help. 15-30 min of daily walking recommended.

## 2022-05-06 NOTE — Assessment & Plan Note (Signed)
Euvolemic at this time. Continue low salt diet. Echo in 10/2018 showed LVEF> 65% and grade 1 diastolic dysfunction.

## 2022-05-06 NOTE — Assessment & Plan Note (Signed)
Continue atorvastatin 20 mg daily and low-fat diet. Further recommendation will be given according to lipid panel results. 

## 2022-05-06 NOTE — Assessment & Plan Note (Signed)
We discussed the importance of staying active, physically and mentally, as well as the benefits of a healthy/balance diet. Low impact exercise that involve stretching and strengthing are ideal. Vaccines: Declines vaccination.  We discussed preventive screening for the next 5-10 years, summery of recommendations given in AVS. This is a list of the screening recommended for you and due dates:  Health Maintenance  Topic Date Due   COVID-19 Vaccine (3 - Pfizer risk series) 05/03/2019   Colon Cancer Screening  05/28/2022   Mammogram  05/31/2022   Medicare Annual Wellness Visit  05/06/2023   DEXA scan (bone density measurement)  Completed   HPV Vaccine  Aged Out   DTaP/Tdap/Td vaccine  Discontinued   Pneumonia Vaccine  Discontinued   Flu Shot  Discontinued   Hepatitis C Screening: USPSTF Recommendation to screen - Ages 18-79 yo.  Discontinued   Zoster (Shingles) Vaccine  Discontinued   Fall prevention. Advance directives and end of life discussed, she does not have POA or living will, planning on doing so.

## 2022-05-27 ENCOUNTER — Ambulatory Visit (AMBULATORY_SURGERY_CENTER): Payer: Medicare Other

## 2022-05-27 VITALS — Ht 66.0 in | Wt 197.0 lb

## 2022-05-27 DIAGNOSIS — Z1211 Encounter for screening for malignant neoplasm of colon: Secondary | ICD-10-CM

## 2022-05-27 MED ORDER — NA SULFATE-K SULFATE-MG SULF 17.5-3.13-1.6 GM/177ML PO SOLN: 1.0000 | Freq: Once | ORAL | 0 refills | Status: AC

## 2022-05-27 NOTE — Progress Notes (Signed)

## 2022-06-04 ENCOUNTER — Other Ambulatory Visit: Payer: Self-pay | Admitting: Nurse Practitioner

## 2022-06-04 ENCOUNTER — Ambulatory Visit
Admission: RE | Admit: 2022-06-04 | Discharge: 2022-06-04 | Disposition: A | Discharge status: Home / Self Care | Payer: Medicare Other | Source: Ambulatory Visit | Attending: Nurse Practitioner | Admitting: Nurse Practitioner

## 2022-06-04 DIAGNOSIS — Z1231 Encounter for screening mammogram for malignant neoplasm of breast: Secondary | ICD-10-CM | POA: Diagnosis not present

## 2022-06-04 DIAGNOSIS — Z170 Estrogen receptor positive status [ER+]: Secondary | ICD-10-CM

## 2022-06-20 ENCOUNTER — Ambulatory Visit (AMBULATORY_SURGERY_CENTER): Payer: Medicare Other | Admitting: Internal Medicine

## 2022-06-20 ENCOUNTER — Encounter: Payer: Self-pay | Admitting: Internal Medicine

## 2022-06-20 VITALS — BP 105/48 | HR 46 | Temp 97.1°F | Resp 16 | Ht 66.0 in | Wt 197.0 lb

## 2022-06-20 DIAGNOSIS — Z1211 Encounter for screening for malignant neoplasm of colon: Secondary | ICD-10-CM | POA: Diagnosis not present

## 2022-06-20 DIAGNOSIS — D120 Benign neoplasm of cecum: Secondary | ICD-10-CM

## 2022-06-20 DIAGNOSIS — K635 Polyp of colon: Secondary | ICD-10-CM | POA: Diagnosis not present

## 2022-06-20 MED ORDER — SODIUM CHLORIDE 0.9 % IV SOLN: 500.0000 mL | Freq: Once | INTRAVENOUS | Status: DC

## 2022-06-20 NOTE — Progress Notes (Signed)
Vss nad trans to pacu 

## 2022-06-20 NOTE — Progress Notes (Signed)
Called to room to assist during endoscopic procedure.  Patient ID and intended procedure confirmed with present staff. Received instructions for my participation in the procedure from the performing physician.  

## 2022-06-20 NOTE — Progress Notes (Signed)
[facility] History and Physical   Primary Care Physician:  [place], Betty G, MD   Reason for Procedure:   CRCA screening  Plan:    colonoscopy     HPI: Molly Fuller is a 72 y.o. female here for above   Past Medical History:  Diagnosis Date   Glaucoma    left eye   Hyperlipidemia    Hypertension    NO MEDS    Past Surgical History:  Procedure Laterality Date   BREAST BIOPSY Left 2021   BREAST LUMPECTOMY Left 07/30/2019   BREAST LUMPECTOMY WITH RADIOACTIVE SEED AND SENTINEL LYMPH NODE BIOPSY Left 07/30/2019   Procedure: LEFT BREAST LUMPECTOMY WITH RADIOACTIVE SEED AND SENTINEL LYMPH NODE BIOPSY;  Surgeon: Almond Lint, MD;  Location: [facility] OR;  Service: General;  Laterality: Left;  REGIONAL FOR POST OP PAIN   no prior surgery     TUBAL LIGATION      Prior to Admission medications   Medication Sig Start Date End Date Taking? Authorizing Provider  anastrozole (ARIMIDEX) 1 MG tablet Take 1 tablet (1 mg total) by mouth daily. 04/15/22  Yes Pollyann Samples, NP  atorvastatin (LIPITOR) 20 MG tablet Take 1 tablet (20 mg total) by mouth daily. 05/06/22  Yes [place], Betty G, MD  brimonidine-timolol (COMBIGAN) 0.2-0.5 % ophthalmic solution Place 1 drop into both eyes every 12 (twelve) hours.   Yes [provider]  dorzolamide (TRUSOPT) 2 % ophthalmic solution Place 1 drop into both eyes 2 (two) times daily. 12/08/17  Yes [provider]  latanoprost (XALATAN) 0.005 % ophthalmic solution Place 1 drop into both eyes at bedtime.  07/30/18  Yes [provider]  EPINEPHrine 0.3 mg/0.3 mL IJ SOAJ injection Inject 0.3 mg into the muscle as needed for anaphylaxis. 12/30/17   [provider]    Current Outpatient Medications  Medication Sig Dispense Refill   anastrozole (ARIMIDEX) 1 MG tablet Take 1 tablet (1 mg total) by mouth daily. 90 tablet 3   atorvastatin (LIPITOR) 20 MG tablet Take 1 tablet (20 mg total) by mouth daily. 90 tablet 3    brimonidine-timolol (COMBIGAN) 0.2-0.5 % ophthalmic solution Place 1 drop into both eyes every 12 (twelve) hours.     dorzolamide (TRUSOPT) 2 % ophthalmic solution Place 1 drop into both eyes 2 (two) times daily.  11   latanoprost (XALATAN) 0.005 % ophthalmic solution Place 1 drop into both eyes at bedtime.      EPINEPHrine 0.3 mg/0.3 mL IJ SOAJ injection Inject 0.3 mg into the muscle as needed for anaphylaxis.     Current Facility-Administered Medications  Medication Dose Route Frequency Provider Last Rate Last Admin   0.9 %  sodium chloride infusion  500 mL Intravenous Once Iva Boop, MD        Allergies as of 06/20/2022 - Review Complete 06/20/2022  Allergen Reaction Noted   Bee venom Swelling 06/23/2019    Family History  Problem Relation Age of Onset   Colon cancer Paternal Grandmother 52   Hypertension Other    Diabetes Other    Breast cancer Neg Hx    Esophageal cancer Neg Hx    Stomach cancer Neg Hx    Rectal cancer Neg Hx    Colon polyps Neg Hx     Social History   Socioeconomic History   Marital status: Married    Spouse name: Not on file   Number of children: 2   Years of education: Not on file  Highest education level: Not on file  Occupational History   Occupation: retired Scientist, water quality   Tobacco Use   Smoking status: Never   Smokeless tobacco: Never  Vaping Use   Vaping Use: Never used  Substance and Sexual Activity   Alcohol use: No   Drug use: No   Sexual activity: Not on file  Other Topics Concern   Not on file  Social History Narrative   Not on file   Social Determinants of Health   Financial Resource Strain: Low Risk  (12/05/2020)   Overall Financial Resource Strain (CARDIA)    Difficulty of Paying Living Expenses: Not hard at all  Food Insecurity: No Food Insecurity (12/05/2020)   Hunger Vital Sign    Worried About Running Out of Food in the Last Year: Never true    Ran Out of Food in the Last Year: Never true  Transportation  Needs: No Transportation Needs (12/05/2020)   PRAPARE - Administrator, Civil Service (Medical): No    Lack of Transportation (Non-Medical): No  Physical Activity: Insufficiently Active (12/05/2020)   Exercise Vital Sign    Days of Exercise per Week: 3 days    Minutes of Exercise per Session: 20 min  Stress: No Stress Concern Present (12/05/2020)   [employer] of Occupational Health - Occupational Stress Questionnaire    Feeling of Stress : Not at all  Social Connections: Moderately Integrated (12/05/2020)   Social Connection and Isolation Panel [NHANES]    Frequency of Communication with Friends and Family: Twice a week    Frequency of Social Gatherings with Friends and Family: Twice a week    Attends Religious Services: More than 4 times per year    Active Member of [employer] or Organizations: No    Attends Banker Meetings: Never    Marital Status: Married  Catering manager Violence: Not At Risk (12/05/2020)   Humiliation, Afraid, Rape, and Kick questionnaire    Fear of Current or Ex-Partner: No    Emotionally Abused: No    Physically Abused: No    Sexually Abused: No    Review of Systems:  All other review of systems negative except as mentioned in the HPI.  Physical Exam: Vital signs BP 131/65   Pulse (!) 55   Temp (!) 97.1 F (36.2 C)   Ht 5\' 6"  (1.676 m)   Wt 197 lb (89.4 kg)   LMP 02/19/2000   SpO2 97%   BMI 31.80 kg/m   General:   Alert,  Well-developed, well-nourished, pleasant and cooperative in NAD Lungs:  Clear throughout to auscultation.   Heart:  Regular rate and rhythm; no murmurs, clicks, rubs,  or gallops. Abdomen:  Soft, nontender and nondistended. Normal bowel sounds.   Neuro/Psych:  Alert and cooperative. Normal mood and affect. A and O x 3   @Taygen Newsome  Sena Slate, MD, [facility] [phone] (pager) 06/20/2022 11:26 AM@

## 2022-06-20 NOTE — Op Note (Signed)
[facility] Patient Name: Molly Fuller Procedure Date: 06/20/2022 11:31 AM MRN: [id number] Endoscopist: Iva Boop , MD, [id number] Age: 72 Referring MD:  Date of Birth: Jun 25, 1950 Gender: Female Account #: [id number] Procedure:                Colonoscopy Indications:              Screening for colorectal malignant neoplasm, Last                            colonoscopy: April 2014 Medicines:                Monitored Anesthesia Care Procedure:                Pre-Anesthesia Assessment:                           - Prior to the procedure, a History and Physical                            was performed, and patient medications and                            allergies were reviewed. The patient's tolerance of                            previous anesthesia was also reviewed. The risks                            and benefits of the procedure and the sedation                            options and risks were discussed with the patient.                            All questions were answered, and informed consent                            was obtained. Prior Anticoagulants: The patient has                            taken no anticoagulant or antiplatelet agents. ASA                            Grade Assessment: II - A patient with mild systemic                            disease. After reviewing the risks and benefits,                            the patient was deemed in satisfactory condition to                            undergo the procedure.  After obtaining informed consent, the colonoscope                            was passed under direct vision. Throughout the                            procedure, the patient's blood pressure, pulse, and                            oxygen saturations were monitored continuously. The                            [id number]-HQ190L Colonoscope [id number] was introduced                            through the anus and advanced to  the the cecum,                            identified by appendiceal orifice and ileocecal                            valve. The colonoscopy was performed without                            difficulty. The patient tolerated the procedure                            well. The quality of the bowel preparation was                            excellent. The bowel preparation used was SUPREP                            via split dose instruction. The ileocecal valve,                            appendiceal orifice, and rectum were photographed. Scope In: 11:32:42 AM Scope Out: 11:44:17 AM Scope Withdrawal Time: 0 hours 8 minutes 50 seconds  Total Procedure Duration: 0 hours 11 minutes 35 seconds  Findings:                 The perianal and digital rectal examinations were                            normal.                           A 1 mm polyp was found in the cecum. The polyp was                            sessile. The polyp was removed with a cold biopsy                            forceps. Resection and retrieval were complete.  Verification of patient identification for the                            specimen was done. Estimated blood loss was minimal.                           Multiple small-mouthed diverticula were found in                            the sigmoid colon.                           Internal hemorrhoids were found.                           The exam was otherwise without abnormality on                            direct and retroflexion views. Complications:            No immediate complications. Estimated Blood Loss:     Estimated blood loss was minimal. Impression:               - One 1 mm polyp in the cecum, removed with a cold                            biopsy forceps. Resected and retrieved.                           - Diverticulosis in the sigmoid colon.                           - Internal hemorrhoids.                           - The examination  was otherwise normal on direct                            and retroflexion views. Recommendation:           - Patient has a contact number available for                            emergencies. The signs and symptoms of potential                            delayed complications were discussed with the                            patient. Return to normal activities tomorrow.                            Written discharge instructions were provided to the                            patient.                           -  Resume previous diet.                           - Continue present medications.                           - Await pathology results.                           - No recommendation at this time regarding repeat                            colonoscopy due to age. Iva Boop, MD 06/20/2022 11:50:41 AM This report has been signed electronically.

## 2022-06-20 NOTE — Patient Instructions (Addendum)
I found and removed one tiny polyp that looks benign. It will be analyzed.  You also have a condition called diverticulosis - common and not usually a problem. Please read the handout provided.  Hemorrhoids were slightly swollen which is common due to the prep.  I appreciate the opportunity to care for you. Iva Boop, MD, FACG  YOU HAD AN ENDOSCOPIC PROCEDURE TODAY AT THE [facility]:   Refer to the procedure report that was given to you for any specific questions about what was found during the examination.  If the procedure report does not answer your questions, please call your gastroenterologist to clarify.  If you requested that your care partner not be given the details of your procedure findings, then the procedure report has been included in a sealed envelope for you to review at your convenience later.  YOU SHOULD EXPECT: Some feelings of bloating in the abdomen. Passage of more gas than usual.  Walking can help get rid of the air that was put into your GI tract during the procedure and reduce the bloating. If you had a lower endoscopy (such as a colonoscopy or flexible sigmoidoscopy) you may notice spotting of blood in your stool or on the toilet paper. If you underwent a bowel prep for your procedure, you may not have a normal bowel movement for a few days.  Please Note:  You might notice some irritation and congestion in your nose or some drainage.  This is from the oxygen used during your procedure.  There is no need for concern and it should clear up in a day or so.  SYMPTOMS TO REPORT IMMEDIATELY:  Following lower endoscopy (colonoscopy or flexible sigmoidoscopy):  Excessive amounts of blood in the stool  Significant tenderness or worsening of abdominal pains  Swelling of the abdomen that is new, acute  Fever of 100F or higher  For urgent or emergent issues, a gastroenterologist can be reached at any hour by calling [phone]. Do not use MyChart  messaging for urgent concerns.    DIET:  We do recommend a small meal at first, but then you may proceed to your regular diet.  Drink plenty of fluids but you should avoid alcoholic beverages for 24 hours.  ACTIVITY:  You should plan to take it easy for the rest of today and you should NOT DRIVE or use heavy machinery until tomorrow (because of the sedation medicines used during the test).    FOLLOW UP: Our staff will call the number listed on your records the next business day following your procedure.  We will call around 7:15- 8:00 am to check on you and address any questions or concerns that you may have regarding the information given to you following your procedure. If we do not reach you, we will leave a message.     If any biopsies were taken you will be contacted by phone or by letter within the next 1-3 weeks.  Please call us at [phone] if you have not heard about the biopsies in 3 weeks.    SIGNATURES/CONFIDENTIALITY: You and/or your care partner have signed paperwork which will be entered into your electronic medical record.  These signatures attest to the fact that that the information above on your After Visit Summary has been reviewed and is understood.  Full responsibility of the confidentiality of this discharge information lies with you and/or your care-partner.

## 2022-06-21 ENCOUNTER — Telehealth: Payer: Self-pay

## 2022-06-21 NOTE — Telephone Encounter (Signed)
  Follow up Call-     06/20/2022   10:44 AM  Call back number  Post procedure Call Back phone  # [phone]  Permission to leave phone message Yes     Patient questions:  Do you have a fever, pain , or abdominal swelling? No. Pain Score  0 *  Have you tolerated food without any problems? Yes.    Have you been able to return to your normal activities? Yes.    Do you have any questions about your discharge instructions: Diet   No. Medications  No. Follow up visit  No.  Do you have questions or concerns about your Care? No.  Actions: * If pain score is 4 or above: No action needed, pain <4.

## 2022-07-03 ENCOUNTER — Encounter: Payer: Self-pay | Admitting: Internal Medicine

## 2022-09-16 DIAGNOSIS — H401133 Primary open-angle glaucoma, bilateral, severe stage: Secondary | ICD-10-CM | POA: Diagnosis not present

## 2022-09-16 DIAGNOSIS — Z961 Presence of intraocular lens: Secondary | ICD-10-CM | POA: Diagnosis not present

## 2022-09-20 ENCOUNTER — Ambulatory Visit (INDEPENDENT_AMBULATORY_CARE_PROVIDER_SITE_OTHER): Payer: Medicare Other | Admitting: Family

## 2022-09-20 VITALS — BP 134/79 | Temp 97.5°F | Ht 66.0 in | Wt 203.2 lb

## 2022-09-20 DIAGNOSIS — U071 COVID-19: Secondary | ICD-10-CM | POA: Diagnosis not present

## 2022-09-20 LAB — POC COVID19 BINAXNOW: SARS Coronavirus 2 Ag: POSITIVE — AB

## 2022-09-20 MED ORDER — NIRMATRELVIR/RITONAVIR (PAXLOVID)TABLET
3.0000 | ORAL_TABLET | Freq: Two times a day (BID) | ORAL | 0 refills | Status: AC
Start: 2022-09-20 — End: 2022-09-25

## 2022-09-20 NOTE — Progress Notes (Signed)
Patient ID: Molly Fuller, female    DOB: 17-Jan-1951, 72 y.o.   MRN: [id number]  Chief Complaint  Patient presents with   Sinus Problem    Pt c/o cough, fatigue, weak, Nasal congestion and chills for 2 days. Has tried tylenol which subsided chills,robitussin and Flonase which did help slightly.      HPI:      Sinus sx:  Pt c/o cough, fatigue, weak, Nasal congestion and chills for 2 days. Denies fever or body aches.  Has tried tylenol which subsided chills,robitussin and Flonase which did help slightly.  Pt did not test for Covid at home.       Assessment & Plan:  1. COVID-19 Sending Paxlovid pt advised of FDA label approval/for emergency use, how to take, & SE. Advised to hold her Lipitor while taking Paxlovid, and of CDC guidelines for masking if out in public. OK to continue taking OTC sinus or pain meds. Encouraged to monitor & notify office of any worsening symptoms: increased shortness of breath, weakness, and signs of dehydration. Instructed to rest and hydrate well.   - POC COVID-19   Subjective:    Outpatient Medications Prior to Visit  Medication Sig Dispense Refill   anastrozole (ARIMIDEX) 1 MG tablet Take 1 tablet (1 mg total) by mouth daily. 90 tablet 3   atorvastatin (LIPITOR) 20 MG tablet Take 1 tablet (20 mg total) by mouth daily. 90 tablet 3   brimonidine-timolol (COMBIGAN) 0.2-0.5 % ophthalmic solution Place 1 drop into both eyes every 12 (twelve) hours.     dorzolamide (TRUSOPT) 2 % ophthalmic solution Place 1 drop into both eyes 2 (two) times daily.  11   EPINEPHrine 0.3 mg/0.3 mL IJ SOAJ injection Inject 0.3 mg into the muscle as needed for anaphylaxis.     latanoprost (XALATAN) 0.005 % ophthalmic solution Place 1 drop into both eyes at bedtime.      No facility-administered medications prior to visit.   Past Medical History:  Diagnosis Date   Glaucoma    left eye   Hyperlipidemia    Hypertension    NO MEDS   Past Surgical History:  Procedure  Laterality Date   BREAST BIOPSY Left 2021   BREAST LUMPECTOMY Left 07/30/2019   BREAST LUMPECTOMY WITH RADIOACTIVE SEED AND SENTINEL LYMPH NODE BIOPSY Left 07/30/2019   Procedure: LEFT BREAST LUMPECTOMY WITH RADIOACTIVE SEED AND SENTINEL LYMPH NODE BIOPSY;  Surgeon: Almond Lint, MD;  Location: [facility] OR;  Service: General;  Laterality: Left;  REGIONAL FOR POST OP PAIN   no prior surgery     TUBAL LIGATION     Allergies  Allergen Reactions   Bee Venom Swelling      Objective:    Physical Exam Vitals and nursing note reviewed.  Constitutional:      Appearance: Normal appearance. She is ill-appearing.  HENT:     Nose: Congestion and rhinorrhea present.     Mouth/Throat:     Mouth: Mucous membranes are moist.     Pharynx: Posterior oropharyngeal erythema present. No pharyngeal swelling, oropharyngeal exudate or uvula swelling.     Tonsils: No tonsillar exudate or tonsillar abscesses.  Cardiovascular:     Rate and Rhythm: Normal rate and regular rhythm.  Pulmonary:     Effort: Pulmonary effort is normal.     Breath sounds: Normal breath sounds.  Musculoskeletal:        General: Normal range of motion.  Lymphadenopathy:     Cervical: No cervical adenopathy.  Skin:  General: Skin is warm and dry.  Neurological:     Mental Status: She is alert.  Psychiatric:        Mood and Affect: Mood normal.        Behavior: Behavior normal.    BP 134/79   Temp (!) 97.5 F (36.4 C) (Temporal)   Ht 5\' 6"  (1.676 m)   Wt 203 lb 4 oz (92.2 kg)   LMP 02/19/2000   SpO2 98%   BMI 32.81 kg/m  Wt Readings from Last 3 Encounters:  09/20/22 203 lb 4 oz (92.2 kg)  06/20/22 197 lb (89.4 kg)  05/27/22 197 lb (89.4 kg)       Dulce Sellar, NP

## 2022-10-18 NOTE — Progress Notes (Unsigned)
Patient Care Team: [place], Betty G, MD as PCP - General (Family Medicine) Ernesto Rutherford, MD (Ophthalmology) Pershing Proud, RN as Oncology Nurse Navigator Donnelly Angelica, RN as Oncology Nurse Navigator Almond Lint, MD as Consulting Physician (General Surgery) Malachy Mood, MD as Consulting Physician (Hematology) Dorothy Puffer, MD as Consulting Physician (Radiation Oncology)   CHIEF COMPLAINT: Follow up left breast cancer   Oncology History Overview Note  Cancer Staging Malignant neoplasm of upper-outer quadrant of left breast in female, estrogen receptor positive [facility]) Staging form: Breast, AJCC 8th Edition - Clinical stage from 06/15/2019: Stage IA (cT1b, cN0, cM0, G2, ER+, PR-, HER2-) - Signed by Malachy Mood, MD on 06/22/2019 - Pathologic stage from 08/19/2019: Stage IA (pT1b, pN0(sn), cM0, G2, ER+, PR-, HER2-) - Unsigned    Malignant neoplasm of upper-outer quadrant of left breast in female, estrogen receptor positive (HCC) (Resolved)  06/14/2019 Mammogram   Diagnostic Mammogram 06/14/19  IMPRESSION: Suspicious mass in the 2 o'clock location of the LEFT breast 5 centimeters from the nipple. Mass is taller than wide and measures 0.8 x 0.7 x 0.8 centimeters.   06/15/2019 Cancer Staging   Staging form: Breast, AJCC 8th Edition - Clinical stage from 06/15/2019: Stage IA (cT1b, cN0, cM0, G2, ER+, PR-, HER2-) - Signed by Malachy Mood, MD on 06/22/2019   06/15/2019 Initial Biopsy   Diagnosis 06/15/19 Breast, left, needle core biopsy, 2 o'clock - INVASIVE MAMMARY CARCINOMA. - SEE COMMENT. Microscopic Comment The carcinoma appears grade 2. The longest span of tumor is 0.7 cm. An E-Cadherin and a breast prognostic profile will be performed and the results reported separately. Dr. Valinda Hoar has reviewed the case and concurs with this interpretation.    06/15/2019 Receptors her2   PROGNOSTIC INDICATORS 06/15/19 Results: IMMUNOHISTOCHEMICAL AND MORPHOMETRIC ANALYSIS PERFORMED MANUALLY The  tumor cells are EQUIVOCAL for Her2 (2+). HER2 by FISH will be preformed and the results reported separately Estrogen Receptor: 100%, POSITIVE, STRONG STAINING INTENSITY Progesterone Receptor: 0%, NEGATIVE Proliferation Marker Ki67: 10% FLUORESCENCE IN-SITU HYBRIDIZATION Results: GROUP 5: HER2 **NEGATIVE**     06/22/2019 Initial Diagnosis   Malignant neoplasm of upper-outer quadrant of left breast in female, estrogen receptor positive (HCC)   07/05/2019 Imaging   MRI breast  IMPRESSION: 1. Recently diagnosed left breast malignancy in the upper outer left breast measures 11 x 8 x 9 mm on MRI. 2. No other evidence of left breast malignancy. No evidence of right breast malignancy. No enlarged or abnormal lymph nodes.     07/30/2019 Surgery   LEFT BREAST LUMPECTOMY WITH RADIOACTIVE SEED AND SENTINEL LYMPH NODE BIOPSY by Dr Donell Beers    07/30/2019 Pathology Results   FINAL MICROSCOPIC DIAGNOSIS:   A. BREAST, LEFT, LUMPECTOMY:  - Invasive lobular carcinoma, 1 cm, Nottingham grade 2 of 3.  - Margins of resection are not involved (Closest margin: 1 mm, lateral).  - Biopsy site.  - See oncology table.   B. SENTINEL LYMPH NODE, LEFT AXILLARY #1, BIOPSY:  - One lymph node, negative for carcinoma (0/1).   C. SENTINEL LYMPH NODE, LEFT AXILLARY #2, BIOPSY:  - One lymph node, negative for carcinoma (0/1).    08/30/2019 - 09/24/2019 Radiation Therapy   Adjuvant Radiation with Dr Mitzi Hansen     09/2019 -  Anti-estrogen oral therapy   Anastrozole 1mg  daily starting 09/2019      CURRENT THERAPY: Anastrozole 1 mg daily started 09/2019  INTERVAL HISTORY Ms. Schwass returns for follow up as scheduled. Last seen by me 04/15/22. Mammogram 06/04/22  was negative. She continues anastrozole  ROS   Past Medical History:  Diagnosis Date   Glaucoma    left eye   Hyperlipidemia    Hypertension    NO MEDS     Past Surgical History:  Procedure Laterality Date   BREAST BIOPSY Left 2021   BREAST LUMPECTOMY  Left 07/30/2019   BREAST LUMPECTOMY WITH RADIOACTIVE SEED AND SENTINEL LYMPH NODE BIOPSY Left 07/30/2019   Procedure: LEFT BREAST LUMPECTOMY WITH RADIOACTIVE SEED AND SENTINEL LYMPH NODE BIOPSY;  Surgeon: Almond Lint, MD;  Location: [facility] OR;  Service: General;  Laterality: Left;  REGIONAL FOR POST OP PAIN   no prior surgery     TUBAL LIGATION       Outpatient Encounter Medications as of 10/22/2022  Medication Sig   anastrozole (ARIMIDEX) 1 MG tablet Take 1 tablet (1 mg total) by mouth daily.   atorvastatin (LIPITOR) 20 MG tablet Take 1 tablet (20 mg total) by mouth daily.   brimonidine-timolol (COMBIGAN) 0.2-0.5 % ophthalmic solution Place 1 drop into both eyes every 12 (twelve) hours.   dorzolamide (TRUSOPT) 2 % ophthalmic solution Place 1 drop into both eyes 2 (two) times daily.   EPINEPHrine 0.3 mg/0.3 mL IJ SOAJ injection Inject 0.3 mg into the muscle as needed for anaphylaxis.   latanoprost (XALATAN) 0.005 % ophthalmic solution Place 1 drop into both eyes at bedtime.    No facility-administered encounter medications on file as of 10/22/2022.     There were no vitals filed for this visit. There is no height or weight on file to calculate BMI.   PHYSICAL EXAM GENERAL:alert, no distress and comfortable SKIN: no rash  EYES: sclera clear NECK: without mass LYMPH:  no palpable cervical or supraclavicular lymphadenopathy  LUNGS: clear with normal breathing effort HEART: regular rate & rhythm, no lower extremity edema ABDOMEN: abdomen soft, non-tender and normal bowel sounds NEURO: alert & oriented x 3 with fluent speech, no focal motor/sensory deficits Breast exam:  PAC without erythema    CBC    Component Value Date/Time   WBC 4.0 04/15/2022 1052   WBC 4.1 10/17/2021 0819   RBC 4.78 04/15/2022 1052   HGB 14.1 04/15/2022 1052   HCT 40.1 04/15/2022 1052   PLT 158 04/15/2022 1052   MCV 83.9 04/15/2022 1052   MCH 29.5 04/15/2022 1052   MCHC 35.2 04/15/2022 1052   RDW 12.1  04/15/2022 1052   LYMPHSABS 1.6 04/15/2022 1052   MONOABS 0.3 04/15/2022 1052   EOSABS 0.0 04/15/2022 1052   BASOSABS 0.0 04/15/2022 1052     CMP     Component Value Date/Time   NA 138 04/15/2022 1052   K 3.9 04/15/2022 1052   CL 106 04/15/2022 1052   CO2 25 04/15/2022 1052   GLUCOSE 84 04/15/2022 1052   BUN 12 04/15/2022 1052   CREATININE 0.66 04/15/2022 1052   CALCIUM 9.0 04/15/2022 1052   PROT 7.3 04/15/2022 1052   ALBUMIN 4.0 04/15/2022 1052   AST 18 04/15/2022 1052   ALT 18 04/15/2022 1052   ALKPHOS 71 04/15/2022 1052   BILITOT 0.9 04/15/2022 1052   GFRNONAA >60 04/15/2022 1052   GFRAA >60 07/22/2019 0922   GFRAA >60 06/23/2019 1212     ASSESSMENT & PLAN:November L Scibetta is a 72 y.o. female with    1. Malignant neoplasm of upper-outer quadrant of left breast, lobular carcinoma, Stage IA, p(T1bN0M0), ER+, PR-/HER2-, Grade II  -Diagnosed in 05/2019, s/p left lumpectomy with Dr Donell Beers on 07/30/19 and  adjuvant radiation with Dr Mitzi Hansen 08/30/19-09/24/19.  -She began adjuvant antiestrogen therapy with Anastrozole in 09/2019. She is tolerating well with mild joint hot flashes. Plan for 7-10 years due to lobular histology, she is in agreement -mammogram 05/2022 was benign   2. Bone Health -Her 09/2019 DEXA was normal with lowest T-score -0.9 at left hip, worsened to -1.1 in 03/2022 which is mild osteopenia. Low frax score.  -Previously stopped calcium/vit D, I recommend to restart and increase weight bearing exercise    PLAN:  No orders of the defined types were placed in this encounter.     All questions were answered. The patient knows to call the clinic with any problems, questions or concerns. No barriers to learning were detected. I spent *** counseling the patient face to face. The total time spent in the appointment was *** and more than 50% was on counseling, review of test results, and coordination of care.   Santiago Glad, NP-C @DATE @

## 2022-10-22 ENCOUNTER — Encounter: Payer: Self-pay | Admitting: Nurse Practitioner

## 2022-10-22 ENCOUNTER — Inpatient Hospital Stay: Payer: Medicare Other | Attending: Nurse Practitioner

## 2022-10-22 ENCOUNTER — Inpatient Hospital Stay (HOSPITAL_BASED_OUTPATIENT_CLINIC_OR_DEPARTMENT_OTHER): Payer: Medicare Other | Admitting: Nurse Practitioner

## 2022-10-22 VITALS — BP 130/70 | HR 52 | Temp 97.5°F | Resp 18 | Ht 66.0 in | Wt 201.4 lb

## 2022-10-22 DIAGNOSIS — Z1231 Encounter for screening mammogram for malignant neoplasm of breast: Secondary | ICD-10-CM | POA: Diagnosis not present

## 2022-10-22 DIAGNOSIS — Z923 Personal history of irradiation: Secondary | ICD-10-CM | POA: Insufficient documentation

## 2022-10-22 DIAGNOSIS — Z170 Estrogen receptor positive status [ER+]: Secondary | ICD-10-CM | POA: Diagnosis not present

## 2022-10-22 DIAGNOSIS — C50412 Malignant neoplasm of upper-outer quadrant of left female breast: Secondary | ICD-10-CM | POA: Diagnosis not present

## 2022-10-22 DIAGNOSIS — M8580 Other specified disorders of bone density and structure, unspecified site: Secondary | ICD-10-CM | POA: Insufficient documentation

## 2022-10-22 LAB — CMP (CANCER CENTER ONLY)
ALT: 13 U/L (ref 0–44)
AST: 17 U/L (ref 15–41)
Albumin: 3.9 g/dL (ref 3.5–5.0)
Alkaline Phosphatase: 76 U/L (ref 38–126)
Anion gap: 4 — ABNORMAL LOW (ref 5–15)
BUN: 13 mg/dL (ref 8–23)
CO2: 26 mmol/L (ref 22–32)
Calcium: 9.4 mg/dL (ref 8.9–10.3)
Chloride: 109 mmol/L (ref 98–111)
Creatinine: 0.74 mg/dL (ref 0.44–1.00)
GFR, Estimated: >60 mL/min (ref >60)
Glucose, Bld: 87 mg/dL (ref 70–99)
Potassium: 4.3 mmol/L (ref 3.5–5.1)
Sodium: 139 mmol/L (ref 135–145)
Total Bilirubin: 0.6 mg/dL (ref 0.3–1.2)
Total Protein: 7.2 g/dL (ref 6.5–8.1)

## 2022-10-22 LAB — CBC WITH DIFFERENTIAL (CANCER CENTER ONLY)
Abs Immature Granulocytes: 0.01 10*3/uL (ref 0.00–0.07)
Basophils Absolute: 0 10*3/uL (ref 0.0–0.1)
Basophils Relative: 1 %
Eosinophils Absolute: 0.1 10*3/uL (ref 0.0–0.5)
Eosinophils Relative: 2 %
HCT: 41.8 % (ref 36.0–46.0)
Hemoglobin: 14.1 g/dL (ref 12.0–15.0)
Immature Granulocytes: 0 %
Lymphocytes Relative: 37 %
Lymphs Abs: 1.6 10*3/uL (ref 0.7–4.0)
MCH: 28.8 pg (ref 26.0–34.0)
MCHC: 33.7 g/dL (ref 30.0–36.0)
MCV: 85.5 fL (ref 80.0–100.0)
Monocytes Absolute: 0.3 10*3/uL (ref 0.1–1.0)
Monocytes Relative: 7 %
Neutro Abs: 2.3 10*3/uL (ref 1.7–7.7)
Neutrophils Relative %: 53 %
Platelet Count: 153 10*3/uL (ref 150–400)
RBC: 4.89 MIL/uL (ref 3.87–5.11)
RDW: 12.6 % (ref 11.5–15.5)
WBC Count: 4.2 10*3/uL (ref 4.0–10.5)
nRBC: 0 % (ref 0.0–0.2)

## 2022-10-22 MED ORDER — ANASTROZOLE 1 MG PO TABS
1.0000 mg | ORAL_TABLET | Freq: Every day | ORAL | 3 refills | Status: DC
Start: 2022-10-22 — End: 2023-10-31

## 2023-04-20 NOTE — Progress Notes (Unsigned)
 Patient Care Team: [place], Betty G, MD as PCP - General (Family Medicine) Ernesto Rutherford, MD (Ophthalmology) Pershing Proud, RN as Oncology Nurse Navigator Donnelly Angelica, RN as Oncology Nurse Navigator Almond Lint, MD as Consulting Physician (General Surgery) Malachy Mood, MD as Consulting Physician (Hematology) Dorothy Puffer, MD as Consulting Physician (Radiation Oncology)   CHIEF COMPLAINT: Follow up left breast cancer   Oncology History Overview Note  Cancer Staging Malignant neoplasm of upper-outer quadrant of left breast in female, estrogen receptor positive [facility]) Staging form: Breast, AJCC 8th Edition - Clinical stage from 06/15/2019: Stage IA (cT1b, cN0, cM0, G2, ER+, PR-, HER2-) - Signed by Malachy Mood, MD on 06/22/2019 - Pathologic stage from 08/19/2019: Stage IA (pT1b, pN0(sn), cM0, G2, ER+, PR-, HER2-) - Unsigned    Malignant neoplasm of upper-outer quadrant of left breast in female, estrogen receptor positive (HCC) (Resolved)  06/14/2019 Mammogram   Diagnostic Mammogram 06/14/19  IMPRESSION: Suspicious mass in the 2 o'clock location of the LEFT breast 5 centimeters from the nipple. Mass is taller than wide and measures 0.8 x 0.7 x 0.8 centimeters.   06/15/2019 Cancer Staging   Staging form: Breast, AJCC 8th Edition - Clinical stage from 06/15/2019: Stage IA (cT1b, cN0, cM0, G2, ER+, PR-, HER2-) - Signed by Malachy Mood, MD on 06/22/2019   06/15/2019 Initial Biopsy   Diagnosis 06/15/19 Breast, left, needle core biopsy, 2 o'clock - INVASIVE MAMMARY CARCINOMA. - SEE COMMENT. Microscopic Comment The carcinoma appears grade 2. The longest span of tumor is 0.7 cm. An E-Cadherin and a breast prognostic profile will be performed and the results reported separately. Dr. Valinda Hoar has reviewed the case and concurs with this interpretation.    06/15/2019 Receptors her2   PROGNOSTIC INDICATORS 06/15/19 Results: IMMUNOHISTOCHEMICAL AND MORPHOMETRIC ANALYSIS PERFORMED MANUALLY The  tumor cells are EQUIVOCAL for Her2 (2+). HER2 by FISH will be preformed and the results reported separately Estrogen Receptor: 100%, POSITIVE, STRONG STAINING INTENSITY Progesterone Receptor: 0%, NEGATIVE Proliferation Marker Ki67: 10% FLUORESCENCE IN-SITU HYBRIDIZATION Results: GROUP 5: HER2 **NEGATIVE**     06/22/2019 Initial Diagnosis   Malignant neoplasm of upper-outer quadrant of left breast in female, estrogen receptor positive (HCC)   07/05/2019 Imaging   MRI breast  IMPRESSION: 1. Recently diagnosed left breast malignancy in the upper outer left breast measures 11 x 8 x 9 mm on MRI. 2. No other evidence of left breast malignancy. No evidence of right breast malignancy. No enlarged or abnormal lymph nodes.     07/30/2019 Surgery   LEFT BREAST LUMPECTOMY WITH RADIOACTIVE SEED AND SENTINEL LYMPH NODE BIOPSY by Dr Donell Beers    07/30/2019 Pathology Results   FINAL MICROSCOPIC DIAGNOSIS:   A. BREAST, LEFT, LUMPECTOMY:  - Invasive lobular carcinoma, 1 cm, Nottingham grade 2 of 3.  - Margins of resection are not involved (Closest margin: 1 mm, lateral).  - Biopsy site.  - See oncology table.   B. SENTINEL LYMPH NODE, LEFT AXILLARY #1, BIOPSY:  - One lymph node, negative for carcinoma (0/1).   C. SENTINEL LYMPH NODE, LEFT AXILLARY #2, BIOPSY:  - One lymph node, negative for carcinoma (0/1).    08/30/2019 - 09/24/2019 Radiation Therapy   Adjuvant Radiation with Dr Mitzi Hansen     09/2019 -  Anti-estrogen oral therapy   Anastrozole 1mg  daily starting 09/2019      CURRENT THERAPY: Anastrozole 1 mg daily, started 09/2019   INTERVAL HISTORY Ms. Israelson returns for follow-up as scheduled, last seen by me 10/22/2022.  Doing  well overall, except she lost her sister this past Sunday, has supportive family to help her through this.  Denies concerns from a breast cancer standpoint, mammogram is scheduled next month.  Tolerating anastrozole with bothersome weight gain and manageable hot flashes, no  significant joint pain.  She walks at least 30 minutes daily but not able to lose the weight.  ROS  All other systems reviewed and negative  Past Medical History:  Diagnosis Date   Glaucoma    left eye   Hyperlipidemia    Hypertension    NO MEDS     Past Surgical History:  Procedure Laterality Date   BREAST BIOPSY Left 2021   BREAST LUMPECTOMY Left 07/30/2019   BREAST LUMPECTOMY WITH RADIOACTIVE SEED AND SENTINEL LYMPH NODE BIOPSY Left 07/30/2019   Procedure: LEFT BREAST LUMPECTOMY WITH RADIOACTIVE SEED AND SENTINEL LYMPH NODE BIOPSY;  Surgeon: Almond Lint, MD;  Location: [facility] OR;  Service: General;  Laterality: Left;  REGIONAL FOR POST OP PAIN   no prior surgery     TUBAL LIGATION       Outpatient Encounter Medications as of 04/22/2023  Medication Sig   anastrozole (ARIMIDEX) 1 MG tablet Take 1 tablet (1 mg total) by mouth daily.   atorvastatin (LIPITOR) 20 MG tablet Take 1 tablet (20 mg total) by mouth daily.   brimonidine-timolol (COMBIGAN) 0.2-0.5 % ophthalmic solution Place 1 drop into both eyes every 12 (twelve) hours.   dorzolamide (TRUSOPT) 2 % ophthalmic solution Place 1 drop into both eyes 2 (two) times daily.   EPINEPHrine 0.3 mg/0.3 mL IJ SOAJ injection Inject 0.3 mg into the muscle as needed for anaphylaxis.   latanoprost (XALATAN) 0.005 % ophthalmic solution Place 1 drop into both eyes at bedtime.    No facility-administered encounter medications on file as of 04/22/2023.     Today's Vitals   04/22/23 0847 04/22/23 0905 04/22/23 0915  BP: (!) 157/74 138/74   Pulse: (!) 57 60   Resp: 15    Temp: 97.6 F (36.4 C)    TempSrc: Temporal    SpO2: 98%    Weight: 200 lb 11.2 oz (91 kg)    PainSc:   0-No pain   Body mass index is 32.39 kg/m.   PHYSICAL EXAM GENERAL:alert, no distress and comfortable SKIN: no rash  EYES: sclera clear NECK: without mass LYMPH:  no palpable cervical or supraclavicular lymphadenopathy  LUNGS:  normal breathing effort HEART:  no  lower extremity edema ABDOMEN: abdomen soft, non-tender and normal bowel sounds NEURO: alert & oriented x 3 with fluent speech, no focal motor/sensory deficits Breast exam: Bilateral inverted nipples without discharge, incisions completely healed.  No palpable mass or nodularity in either breast or axilla that I could appreciate.   CBC    Component Value Date/Time   WBC 4.7 04/22/2023 0831   WBC 4.1 10/17/2021 0819   RBC 4.67 04/22/2023 0831   HGB 13.4 04/22/2023 0831   HCT 39.7 04/22/2023 0831   PLT 160 04/22/2023 0831   MCV 85.0 04/22/2023 0831   MCH 28.7 04/22/2023 0831   MCHC 33.8 04/22/2023 0831   RDW 12.3 04/22/2023 0831   LYMPHSABS 1.6 04/22/2023 0831   MONOABS 0.4 04/22/2023 0831   EOSABS 0.1 04/22/2023 0831   BASOSABS 0.0 04/22/2023 0831     CMP     Component Value Date/Time   NA 140 04/22/2023 0831   K 3.9 04/22/2023 0831   CL 108 04/22/2023 0831   CO2 27 04/22/2023 0831  GLUCOSE 95 04/22/2023 0831   BUN 12 04/22/2023 0831   CREATININE 0.71 04/22/2023 0831   CALCIUM 9.2 04/22/2023 0831   PROT 7.2 04/22/2023 0831   ALBUMIN 3.8 04/22/2023 0831   AST 13 (L) 04/22/2023 0831   ALT 8 04/22/2023 0831   ALKPHOS 65 04/22/2023 0831   BILITOT 0.7 04/22/2023 0831   GFRNONAA >60 04/22/2023 0831   GFRAA >60 07/22/2019 0922   GFRAA >60 06/23/2019 1212     ASSESSMENT & PLAN:Eleina L Allerton is a 73 y.o. female with    1. Malignant neoplasm of upper-outer quadrant of left breast, lobular carcinoma, Stage IA, p(T1bN0M0), ER+, PR-/HER2-, Grade II  -Diagnosed in 05/2019, s/p left lumpectomy with Dr Donell Beers on 07/30/19 and adjuvant radiation with Dr Mitzi Hansen 08/30/19-09/24/19.  -She began adjuvant antiestrogen therapy with Anastrozole in 09/2019. Plan for 7 years due to lobular histology, she is in agreement -Ms. Uplinger is clinically doing well.  Tolerating anastrozole with manageable hot flashes and weight gain.  She accepted a referral to medical weight loss management clinic.  Exam  is benign, labs are unremarkable.  Overall no clinical concern for recurrence -Mammogram scheduled next month.  Continue breast cancer surveillance and AI -Follow-up in 6 months, or sooner if needed  2.  Pulmonary nodules -Initially seen incidentally on CT abdomen, follow-up CT chest without contrast 04/10/2021 showed index 5 mm nodule which was present and stable since 2022, likely benign, other 2-3 mm nodules too small to characterize but also likely benign -We discussed repeat CT chest, she declined for now.  Currently no respiratory symptoms -She will let us know if/when she decides to proceed and will also discuss with her PCP   3. Bone Health -Her 09/2019 DEXA was normal with lowest T-score -0.9 at left hip, worsened to -1.1 in 03/2022 which is mild osteopenia. Low frax score.  -Continue calcium, vitamin D, and weightbearing exercise   PLAN: -Labs reviewed -Continue breast cancer surveillance and anastrozole -Mammogram 06/05/23 as scheduled, DEXA due with mammo next year -Referral to medical weight loss management -Discussed CT chest, declined further imaging for now -Declined resources to cope with sisters passing, has supportive family -Follow-up in 6 months, or sooner if needed  Orders Placed This Encounter  Procedures   Amb Ref to Medical Weight Management    Referral Priority:   Routine    Referral Type:   Consultation    Number of Visits Requested:   1      All questions were answered. The patient knows to call the clinic with any problems, questions or concerns. No barriers to learning were detected. Spent 20 minutes face to face counseling the patient, total encounter time was 30 minutes.   Santiago Glad, NP-C 04/22/2023

## 2023-04-21 ENCOUNTER — Other Ambulatory Visit: Payer: Self-pay

## 2023-04-21 DIAGNOSIS — C50412 Malignant neoplasm of upper-outer quadrant of left female breast: Secondary | ICD-10-CM

## 2023-04-22 ENCOUNTER — Encounter: Payer: Self-pay | Admitting: Nurse Practitioner

## 2023-04-22 ENCOUNTER — Inpatient Hospital Stay: Payer: Medicare Other | Attending: Nurse Practitioner | Admitting: Nurse Practitioner

## 2023-04-22 ENCOUNTER — Inpatient Hospital Stay: Payer: Medicare Other

## 2023-04-22 VITALS — BP 138/74 | HR 60 | Temp 97.6°F | Resp 15 | Wt 200.7 lb

## 2023-04-22 DIAGNOSIS — M8580 Other specified disorders of bone density and structure, unspecified site: Secondary | ICD-10-CM | POA: Insufficient documentation

## 2023-04-22 DIAGNOSIS — Z170 Estrogen receptor positive status [ER+]: Secondary | ICD-10-CM | POA: Diagnosis not present

## 2023-04-22 DIAGNOSIS — Z79811 Long term (current) use of aromatase inhibitors: Secondary | ICD-10-CM | POA: Diagnosis not present

## 2023-04-22 DIAGNOSIS — R918 Other nonspecific abnormal finding of lung field: Secondary | ICD-10-CM | POA: Insufficient documentation

## 2023-04-22 DIAGNOSIS — Z961 Presence of intraocular lens: Secondary | ICD-10-CM | POA: Diagnosis not present

## 2023-04-22 DIAGNOSIS — Z1732 Human epidermal growth factor receptor 2 negative status: Secondary | ICD-10-CM | POA: Insufficient documentation

## 2023-04-22 DIAGNOSIS — Z923 Personal history of irradiation: Secondary | ICD-10-CM | POA: Diagnosis not present

## 2023-04-22 DIAGNOSIS — N951 Menopausal and female climacteric states: Secondary | ICD-10-CM | POA: Insufficient documentation

## 2023-04-22 DIAGNOSIS — Z1722 Progesterone receptor negative status: Secondary | ICD-10-CM | POA: Insufficient documentation

## 2023-04-22 DIAGNOSIS — H401133 Primary open-angle glaucoma, bilateral, severe stage: Secondary | ICD-10-CM | POA: Diagnosis not present

## 2023-04-22 DIAGNOSIS — C50412 Malignant neoplasm of upper-outer quadrant of left female breast: Secondary | ICD-10-CM | POA: Insufficient documentation

## 2023-04-22 LAB — CBC WITH DIFFERENTIAL (CANCER CENTER ONLY)
Abs Immature Granulocytes: 0.01 10*3/uL (ref 0.00–0.07)
Basophils Absolute: 0 10*3/uL (ref 0.0–0.1)
Basophils Relative: 0 %
Eosinophils Absolute: 0.1 10*3/uL (ref 0.0–0.5)
Eosinophils Relative: 2 %
HCT: 39.7 % (ref 36.0–46.0)
Hemoglobin: 13.4 g/dL (ref 12.0–15.0)
Immature Granulocytes: 0 %
Lymphocytes Relative: 34 %
Lymphs Abs: 1.6 10*3/uL (ref 0.7–4.0)
MCH: 28.7 pg (ref 26.0–34.0)
MCHC: 33.8 g/dL (ref 30.0–36.0)
MCV: 85 fL (ref 80.0–100.0)
Monocytes Absolute: 0.4 10*3/uL (ref 0.1–1.0)
Monocytes Relative: 9 %
Neutro Abs: 2.6 10*3/uL (ref 1.7–7.7)
Neutrophils Relative %: 55 %
Platelet Count: 160 10*3/uL (ref 150–400)
RBC: 4.67 MIL/uL (ref 3.87–5.11)
RDW: 12.3 % (ref 11.5–15.5)
WBC Count: 4.7 10*3/uL (ref 4.0–10.5)
nRBC: 0 % (ref 0.0–0.2)

## 2023-04-22 LAB — CMP (CANCER CENTER ONLY)
ALT: 8 U/L (ref 0–44)
AST: 13 U/L — ABNORMAL LOW (ref 15–41)
Albumin: 3.8 g/dL (ref 3.5–5.0)
Alkaline Phosphatase: 65 U/L (ref 38–126)
Anion gap: 5 (ref 5–15)
BUN: 12 mg/dL (ref 8–23)
CO2: 27 mmol/L (ref 22–32)
Calcium: 9.2 mg/dL (ref 8.9–10.3)
Chloride: 108 mmol/L (ref 98–111)
Creatinine: 0.71 mg/dL (ref 0.44–1.00)
GFR, Estimated: >60 mL/min (ref >60)
Glucose, Bld: 95 mg/dL (ref 70–99)
Potassium: 3.9 mmol/L (ref 3.5–5.1)
Sodium: 140 mmol/L (ref 135–145)
Total Bilirubin: 0.7 mg/dL (ref 0.0–1.2)
Total Protein: 7.2 g/dL (ref 6.5–8.1)

## 2023-05-08 ENCOUNTER — Ambulatory Visit: Payer: Medicare Other | Admitting: Family Medicine

## 2023-05-12 ENCOUNTER — Ambulatory Visit (INDEPENDENT_AMBULATORY_CARE_PROVIDER_SITE_OTHER): Admitting: Family Medicine

## 2023-05-12 ENCOUNTER — Encounter: Payer: Self-pay | Admitting: Family Medicine

## 2023-05-12 VITALS — BP 134/80 | HR 54 | Temp 97.9°F | Wt 204.0 lb

## 2023-05-12 DIAGNOSIS — J069 Acute upper respiratory infection, unspecified: Secondary | ICD-10-CM

## 2023-05-12 MED ORDER — DOXYCYCLINE HYCLATE 100 MG PO TABS: 100.0000 mg | ORAL_TABLET | Freq: Two times a day (BID) | ORAL | 0 refills | Status: AC

## 2023-05-12 MED ORDER — PROMETHAZINE-DM 6.25-15 MG/5ML PO SYRP: 5.0000 mL | ORAL_SOLUTION | Freq: Four times a day (QID) | ORAL | 0 refills | Status: DC | PRN

## 2023-05-12 NOTE — Progress Notes (Signed)
 Acute Office Visit   Subjective:  Patient ID: Molly Fuller, female    DOB: December 05, 1950, 73 y.o.   MRN: [id number]  Chief Complaint  Patient presents with   Fever   Ear Pain   Cough   Sinusitis    Fever  Associated symptoms include coughing.  Cough Associated symptoms include a fever.  Sinusitis Associated symptoms include coughing.   Patient is here for an acute visit. Patient is having the following symptoms:  Sore throat Productive cough-yellow, thick  Ear pressure  Nasal drainage-yellow  Nasal congestion Post nasal drip Fatigue  Diarrhea-improved Headache-improved Chills-improved   Denies fever, SHOB, chest pain, nausea, vomiting, abd pain or body aches.    Patient reports she has had some symptoms for 10 days. Her symptoms started after taking care of a grandchild who had some symptoms and went to a funeral.   She has been taking Mucinex-DM for 3 days with no relief and Robitussin cough syrup with no relief.   Review of Systems  Constitutional:  Positive for fever.  Respiratory:  Positive for cough.    See HPI above      Objective:   BP 134/80   Pulse (!) 54   Temp 97.9 F (36.6 C) (Oral)   Wt 204 lb (92.5 kg)   LMP 02/19/2000   SpO2 97%   BMI 32.93 kg/m    Physical Exam Vitals reviewed.  Constitutional:      General: She is not in acute distress.    Appearance: Normal appearance. She is not ill-appearing, toxic-appearing or diaphoretic.  HENT:     Head: Normocephalic and atraumatic.     Right Ear: Tympanic membrane, ear canal and external ear normal. There is no impacted cerumen.     Left Ear: Tympanic membrane, ear canal and external ear normal. There is no impacted cerumen.     Nose:     Right Sinus: No maxillary sinus tenderness or frontal sinus tenderness.     Left Sinus: No maxillary sinus tenderness or frontal sinus tenderness.     Mouth/Throat:     Mouth: Mucous membranes are moist.     Pharynx: Oropharynx is clear. Uvula  midline. No pharyngeal swelling, oropharyngeal exudate, posterior oropharyngeal erythema or uvula swelling.  Eyes:     General:        Right eye: No discharge.        Left eye: No discharge.     Conjunctiva/sclera: Conjunctivae normal.  Cardiovascular:     Rate and Rhythm: Normal rate and regular rhythm.     Heart sounds: Normal heart sounds. No murmur heard.    No friction rub. No gallop.  Pulmonary:     Effort: Pulmonary effort is normal. No respiratory distress.     Breath sounds: Normal breath sounds.  Musculoskeletal:        General: Normal range of motion.  Skin:    General: Skin is warm and dry.  Neurological:     General: No focal deficit present.     Mental Status: She is alert and oriented to person, place, and time. Mental status is at baseline.  Psychiatric:        Mood and Affect: Mood normal.        Behavior: Behavior normal.        Thought Content: Thought content normal.        Judgment: Judgment normal.       Assessment & Plan:  Upper respiratory tract infection, unspecified type -  Doxycycline Hyclate; Take 1 tablet (100 mg total) by mouth 2 (two) times daily for 7 days.  Dispense: 14 tablet; Refill: 0 -     Promethazine-DM; Take 5 mLs by mouth 4 (four) times daily as needed.  Dispense: 118 mL; Refill: 0  -Symptoms have been occurring for 10 days with no relief with taking OTC medication. Will treat with antibiotics and cough syrup. If not improved, follow up with Dr. [place] or myself.  -Prescribed Doxycycline 100mg  tablet, take twice a day for 7 days. -Prescribed Promethazine-DM cough syrup. Take every 4 hours as needed for cough.   Zandra Abts, NP

## 2023-05-12 NOTE — Patient Instructions (Addendum)
-  I hope you get to feeling better soon.  -Prescribed Doxycycline 100mg  tablet, take twice a day for 7 days. -Prescribed Promethazine-DM cough syrup. Take every 4 hours as needed for cough.  -If not improved after taking antibiotic, follow up with either Dr. [place] or myself.

## 2023-06-05 ENCOUNTER — Ambulatory Visit
Admission: RE | Admit: 2023-06-05 | Discharge: 2023-06-05 | Disposition: A | Discharge status: Home / Self Care | Payer: Medicare Other | Source: Ambulatory Visit | Attending: Nurse Practitioner | Admitting: Nurse Practitioner

## 2023-06-05 DIAGNOSIS — Z1231 Encounter for screening mammogram for malignant neoplasm of breast: Secondary | ICD-10-CM | POA: Diagnosis not present

## 2023-09-16 DIAGNOSIS — Z961 Presence of intraocular lens: Secondary | ICD-10-CM | POA: Diagnosis not present

## 2023-09-16 DIAGNOSIS — H401133 Primary open-angle glaucoma, bilateral, severe stage: Secondary | ICD-10-CM | POA: Diagnosis not present

## 2023-10-07 DIAGNOSIS — Z961 Presence of intraocular lens: Secondary | ICD-10-CM | POA: Diagnosis not present

## 2023-10-07 DIAGNOSIS — H401133 Primary open-angle glaucoma, bilateral, severe stage: Secondary | ICD-10-CM | POA: Diagnosis not present

## 2023-10-31 ENCOUNTER — Other Ambulatory Visit: Payer: Self-pay | Admitting: Nurse Practitioner

## 2023-10-31 DIAGNOSIS — C50412 Malignant neoplasm of upper-outer quadrant of left female breast: Secondary | ICD-10-CM

## 2023-11-03 ENCOUNTER — Other Ambulatory Visit: Payer: Self-pay | Admitting: Nurse Practitioner

## 2023-11-03 DIAGNOSIS — C50412 Malignant neoplasm of upper-outer quadrant of left female breast: Secondary | ICD-10-CM | POA: Insufficient documentation

## 2023-11-03 NOTE — Assessment & Plan Note (Addendum)
 lobular carcinoma, Stage IA, p(T1bN0M0), ER+, PR-/HER2-, Grade II  -Diagnosed in 05/2019, s/p left lumpectomy with Dr Aron on 07/30/19 and adjuvant radiation with Dr Dewey 08/30/19-09/24/19.  -She began adjuvant antiestrogen therapy with Anastrozole  in 09/2019. Plan for 7 years due to lobular histology, she is in agreement -Molly Fuller is clinically doing well.  Tolerating anastrozole  with manageable hot flashes and weight gain.  She accepted a referral to medical weight loss management clinic.  Exam is benign, labs are unremarkable.  Overall no clinical concern for recurrence -3D screening mammogram done 06/05/2023 with negative results.  -plan for follow up in 6 months, sooner if needed.

## 2023-11-03 NOTE — Progress Notes (Signed)
 Patient Care Team: [place], Betty G, MD as PCP - General (Family Medicine) Octavia Charleston, MD (Ophthalmology) Tyree Nanetta SAILOR, RN as Oncology Nurse Navigator Aron Shoulders, MD as Consulting Physician (General Surgery) Lanny Callander, MD as Consulting Physician (Hematology) Dewey Rush, MD as Consulting Physician (Radiation Oncology)  Clinic Day:  11/04/2023  Referring physician: [place], Betty G, MD  ASSESSMENT & PLAN:   Assessment & Plan: Malignant neoplasm of upper-outer quadrant of left breast in female, estrogen receptor positive (HCC)  lobular carcinoma, Stage IA, p(T1bN0M0), ER+, PR-/HER2-, Grade II  -Diagnosed in 05/2019, s/p left lumpectomy with Dr Aron on 07/30/19 and adjuvant radiation with Dr Dewey 08/30/19-09/24/19.  -She began adjuvant antiestrogen therapy with Anastrozole  in 09/2019. Plan for 7 years due to lobular histology, she is in agreement -Ms. Bonsell is clinically doing well.  Tolerating anastrozole  with manageable hot flashes and weight gain.  She accepted a referral to medical weight loss management clinic.  Exam is benign, labs are unremarkable.  Overall no clinical concern for recurrence -3D screening mammogram done 06/05/2023 with negative results.  -plan for follow up in 6 months, sooner if needed.   BMI 33.0-33.9,adult Referral referral placed to [facility] health weight management clinic.  Clinic.  Clinic.   BMI 33.52 Patient reports doing multiple things to help with weight loss.  She has limited her daily calorie intake to 1500 cal/day.  She has increased proteins and decrease carbohydrates and saturated fats.  She is exercising on regular basis, including exercise with light weights.  She has not a 3 pound weight gain since March 2025.  She is interested in referral to weight loss clinic to help her lose the weight she desires.  Feels like taking anastrozole  is making it extra difficult to be successful losing weight.  Plan Labs reviewed. - Unremarkable CBC and  CMP. Reviewed 3D screening mammogram from 06/05/2023 with negative results.  Repeat in April 2026. Continue anastrozole  daily. Labs and follow-up in 6 months, sooner if needed.  The patient understands the plans discussed today and is in agreement with them.  She knows to contact our office if she develops concerns prior to her next appointment.  I provided 25 minutes of face-to-face time during this encounter and > 50% was spent counseling as documented under my assessment and plan.    Powell FORBES Lessen, NP  [facility] - A DEPT OF [facility] [address] [place] [address] Dept: [phone] Dept Fax: [phone]   Orders Placed This Encounter  Procedures   Amb Ref to Medical Weight Management    Referral Priority:   Routine    Referral Type:   Consultation    Number of Visits Requested:   1      CHIEF COMPLAINT:  CC: left breast cancer, ER +  Current Treatment:  anastrozole  1 mg daily   INTERVAL HISTORY:  Sharece is here today for repeat clinical assessment. She was last seen by Lacie, NP on 04/22/2023. She had 3D screening mammogram in 06/05/2023 which was negative. Bone density test done 04/08/2022 showed osteopenia without osteoporosis. There was significant change noted.  She will be due again in 03/2024.  She reports having a difficult time with weight loss despite multiple efforts to reduce calorie intake and increased exercise.  She denies changes, months, or concerns with either breast.  She denies chest pain, chest pressure, or shortness of breath. She denies headaches or visual disturbances. She denies  abdominal pain, nausea, vomiting, or changes in bowel or bladder habits.  She denies fevers or chills. She denies pain. Her appetite is good. Her weight has increased 3 pounds over last 6 months.  I have reviewed the past medical history, past surgical history, social history and family history with the  patient and they are unchanged from previous note.  ALLERGIES:  is allergic to bee venom.  MEDICATIONS:  Current Outpatient Medications  Medication Sig Dispense Refill   anastrozole  (ARIMIDEX ) 1 MG tablet Take 1 tablet (1 mg total) by mouth daily. 90 tablet 3   atorvastatin  (LIPITOR) 20 MG tablet Take 1 tablet (20 mg total) by mouth daily. 90 tablet 3   brimonidine-timolol (COMBIGAN) 0.2-0.5 % ophthalmic solution Place 1 drop into both eyes every 12 (twelve) hours.     dorzolamide (TRUSOPT) 2 % ophthalmic solution Place 1 drop into both eyes 2 (two) times daily.  11   EPINEPHrine  0.3 mg/0.3 mL IJ SOAJ injection Inject 0.3 mg into the muscle as needed for anaphylaxis.     latanoprost (XALATAN) 0.005 % ophthalmic solution Place 1 drop into both eyes at bedtime.      No current facility-administered medications for this visit.    HISTORY OF PRESENT ILLNESS:   Oncology History Overview Note  Cancer Staging Malignant neoplasm of upper-outer quadrant of left breast in female, estrogen receptor positive (HCC) Staging form: Breast, AJCC 8th Edition - Clinical stage from 06/15/2019: Stage IA (cT1b, cN0, cM0, G2, ER+, PR-, HER2-) - Signed by Lanny Callander, MD on 06/22/2019 - Pathologic stage from 08/19/2019: Stage IA (pT1b, pN0(sn), cM0, G2, ER+, PR-, HER2-) - Unsigned    Malignant neoplasm of upper-outer quadrant of left breast in female, estrogen receptor positive (HCC) (Resolved)  06/14/2019 Mammogram   Diagnostic Mammogram 06/14/19  IMPRESSION: Suspicious mass in the 2 o'clock location of the LEFT breast 5 centimeters from the nipple. Mass is taller than wide and measures 0.8 x 0.7 x 0.8 centimeters.   06/15/2019 Cancer Staging   Staging form: Breast, AJCC 8th Edition - Clinical stage from 06/15/2019: Stage IA (cT1b, cN0, cM0, G2, ER+, PR-, HER2-) - Signed by Lanny Callander, MD on 06/22/2019   06/15/2019 Initial Biopsy   Diagnosis 06/15/19 Breast, left, needle core biopsy, 2 o'clock - INVASIVE MAMMARY  CARCINOMA. - SEE COMMENT. Microscopic Comment The carcinoma appears grade 2. The longest span of tumor is 0.7 cm. An E-Cadherin and a breast prognostic profile will be performed and the results reported separately. Dr. Recardo Likens has reviewed the case and concurs with this interpretation.    06/15/2019 Receptors her2   PROGNOSTIC INDICATORS 06/15/19 Results: IMMUNOHISTOCHEMICAL AND MORPHOMETRIC ANALYSIS PERFORMED MANUALLY The tumor cells are EQUIVOCAL for Her2 (2+). HER2 by FISH will be preformed and the results reported separately Estrogen Receptor: 100%, POSITIVE, STRONG STAINING INTENSITY Progesterone Receptor: 0%, NEGATIVE Proliferation Marker Ki67: 10% FLUORESCENCE IN-SITU HYBRIDIZATION Results: GROUP 5: HER2 **NEGATIVE**     06/22/2019 Initial Diagnosis   Malignant neoplasm of upper-outer quadrant of left breast in female, estrogen receptor positive (HCC)   07/05/2019 Imaging   MRI breast  IMPRESSION: 1. Recently diagnosed left breast malignancy in the upper outer left breast measures 11 x 8 x 9 mm on MRI. 2. No other evidence of left breast malignancy. No evidence of right breast malignancy. No enlarged or abnormal lymph nodes.     07/30/2019 Surgery   LEFT BREAST LUMPECTOMY WITH RADIOACTIVE SEED AND SENTINEL LYMPH NODE BIOPSY by Dr Aron    07/30/2019 Pathology  Results   FINAL MICROSCOPIC DIAGNOSIS:   A. BREAST, LEFT, LUMPECTOMY:  - Invasive lobular carcinoma, 1 cm, Nottingham grade 2 of 3.  - Margins of resection are not involved (Closest margin: 1 mm, lateral).  - Biopsy site.  - See oncology table.   B. SENTINEL LYMPH NODE, LEFT AXILLARY #1, BIOPSY:  - One lymph node, negative for carcinoma (0/1).   C. SENTINEL LYMPH NODE, LEFT AXILLARY #2, BIOPSY:  - One lymph node, negative for carcinoma (0/1).    08/30/2019 - 09/24/2019 Radiation Therapy   Adjuvant Radiation with Dr Dewey     09/2019 -  Anti-estrogen oral therapy   Anastrozole  1mg  daily starting 09/2019        REVIEW OF SYSTEMS:   Constitutional: Denies fevers, chills or abnormal weight loss. Has had weight gain despite efforts to cut calorie intake and increase exercise.  Eyes: Denies blurriness of vision Ears, nose, mouth, throat, and face: Denies mucositis or sore throat Respiratory: Denies cough, dyspnea or wheezes Cardiovascular: Denies palpitation, chest discomfort or lower extremity swelling Gastrointestinal:  Denies nausea, heartburn or change in bowel habits Skin: Denies abnormal skin rashes Lymphatics: Denies new lymphadenopathy or easy bruising Neurological:Denies numbness, tingling or new weaknesses Behavioral/Psych: Mood is stable, no new changes  All other systems were reviewed with the patient and are negative.   VITALS:   Today's Vitals   11/04/23 0800 11/04/23 0831  BP:  118/64  Pulse:  60  Resp:  15  Temp:  97.6 F (36.4 C)  TempSrc:  Temporal  SpO2:  97%  Weight:  207 lb 11.2 oz (94.2 kg)  PainSc: 0-No pain    Body mass index is 33.52 kg/m.   Wt Readings from Last 3 Encounters:  11/04/23 207 lb 11.2 oz (94.2 kg)  05/12/23 204 lb (92.5 kg)  04/22/23 200 lb 11.2 oz (91 kg)    Body mass index is 33.52 kg/m.  Performance status (ECOG): 1 - Symptomatic but completely ambulatory  PHYSICAL EXAM:   GENERAL:alert, no distress and comfortable SKIN: skin color, texture, turgor are normal, no rashes or significant lesions EYES: normal, Conjunctiva are pink and non-injected, sclera clear OROPHARYNX:no exudate, no erythema and lips, buccal mucosa, and tongue normal  NECK: supple, thyroid  normal size, non-tender, without nodularity LYMPH:  no palpable lymphadenopathy in the cervical, axillary or inguinal LUNGS: clear to auscultation and percussion with normal breathing effort HEART: regular rate & rhythm and no murmurs and no lower extremity edema ABDOMEN:abdomen soft, non-tender and normal bowel sounds Musculoskeletal:no cyanosis of digits and no clubbing   NEURO: alert & oriented x 3 with fluent speech, no focal motor/sensory deficits BREAST: There is well-healed lumpectomy scar left upper breast.  There are no palpable lumps or masses noted today.  There is note there is no nipple inversion or nipple discharge.  There is no axillary lymphadenopathy on the left.  There are no palpable masses or lumps in the right breast.  There is no nipple inversion or nipple discharge.  There is no axillary lymphadenopathy on the right.   LABORATORY DATA:  I have reviewed the data as listed    Component Value Date/Time   NA 141 11/04/2023 0756   K 4.1 11/04/2023 0756   CL 109 11/04/2023 0756   CO2 25 11/04/2023 0756   GLUCOSE 90 11/04/2023 0756   BUN 12 11/04/2023 0756   CREATININE 0.75 11/04/2023 0756   CALCIUM  9.1 11/04/2023 0756   PROT 6.8 11/04/2023 0756   ALBUMIN 3.8 11/04/2023  0756   AST 28 11/04/2023 0756   ALT 17 11/04/2023 0756   ALKPHOS 63 11/04/2023 0756   BILITOT 0.7 11/04/2023 0756   GFRNONAA >60 11/04/2023 0756   GFRAA >60 07/22/2019 0922   GFRAA >60 06/23/2019 1212    Lab Results  Component Value Date   WBC 3.7 (L) 11/04/2023   NEUTROABS 1.7 11/04/2023   HGB 13.2 11/04/2023   HCT 38.3 11/04/2023   MCV 83.6 11/04/2023   PLT 163 11/04/2023

## 2023-11-04 ENCOUNTER — Inpatient Hospital Stay: Attending: Nurse Practitioner

## 2023-11-04 ENCOUNTER — Inpatient Hospital Stay (HOSPITAL_BASED_OUTPATIENT_CLINIC_OR_DEPARTMENT_OTHER): Admitting: Nurse Practitioner

## 2023-11-04 VITALS — BP 118/64 | HR 60 | Temp 97.6°F | Resp 15 | Wt 207.7 lb

## 2023-11-04 DIAGNOSIS — Z170 Estrogen receptor positive status [ER+]: Secondary | ICD-10-CM

## 2023-11-04 DIAGNOSIS — C50412 Malignant neoplasm of upper-outer quadrant of left female breast: Secondary | ICD-10-CM | POA: Insufficient documentation

## 2023-11-04 DIAGNOSIS — Z1732 Human epidermal growth factor receptor 2 negative status: Secondary | ICD-10-CM | POA: Insufficient documentation

## 2023-11-04 DIAGNOSIS — Z1722 Progesterone receptor negative status: Secondary | ICD-10-CM | POA: Insufficient documentation

## 2023-11-04 DIAGNOSIS — N951 Menopausal and female climacteric states: Secondary | ICD-10-CM | POA: Diagnosis not present

## 2023-11-04 DIAGNOSIS — Z79899 Other long term (current) drug therapy: Secondary | ICD-10-CM | POA: Diagnosis not present

## 2023-11-04 DIAGNOSIS — Z6833 Body mass index (BMI) 33.0-33.9, adult: Secondary | ICD-10-CM | POA: Insufficient documentation

## 2023-11-04 DIAGNOSIS — Z923 Personal history of irradiation: Secondary | ICD-10-CM | POA: Diagnosis not present

## 2023-11-04 DIAGNOSIS — M8580 Other specified disorders of bone density and structure, unspecified site: Secondary | ICD-10-CM | POA: Insufficient documentation

## 2023-11-04 DIAGNOSIS — Z79811 Long term (current) use of aromatase inhibitors: Secondary | ICD-10-CM | POA: Diagnosis not present

## 2023-11-04 LAB — CMP (CANCER CENTER ONLY)
ALT: 17 U/L (ref 0–44)
AST: 28 U/L (ref 15–41)
Albumin: 3.8 g/dL (ref 3.5–5.0)
Alkaline Phosphatase: 63 U/L (ref 38–126)
Anion gap: 7 (ref 5–15)
BUN: 12 mg/dL (ref 8–23)
CO2: 25 mmol/L (ref 22–32)
Calcium: 9.1 mg/dL (ref 8.9–10.3)
Chloride: 109 mmol/L (ref 98–111)
Creatinine: 0.75 mg/dL (ref 0.44–1.00)
GFR, Estimated: >60 mL/min (ref >60)
Glucose, Bld: 90 mg/dL (ref 70–99)
Potassium: 4.1 mmol/L (ref 3.5–5.1)
Sodium: 141 mmol/L (ref 135–145)
Total Bilirubin: 0.7 mg/dL (ref 0.0–1.2)
Total Protein: 6.8 g/dL (ref 6.5–8.1)

## 2023-11-04 LAB — CBC WITH DIFFERENTIAL (CANCER CENTER ONLY)
Abs Immature Granulocytes: 0.03 K/uL (ref 0.00–0.07)
Basophils Absolute: 0 K/uL (ref 0.0–0.1)
Basophils Relative: 1 %
Eosinophils Absolute: 0.1 K/uL (ref 0.0–0.5)
Eosinophils Relative: 2 %
HCT: 38.3 % (ref 36.0–46.0)
Hemoglobin: 13.2 g/dL (ref 12.0–15.0)
Immature Granulocytes: 1 %
Lymphocytes Relative: 41 %
Lymphs Abs: 1.5 K/uL (ref 0.7–4.0)
MCH: 28.8 pg (ref 26.0–34.0)
MCHC: 34.5 g/dL (ref 30.0–36.0)
MCV: 83.6 fL (ref 80.0–100.0)
Monocytes Absolute: 0.3 K/uL (ref 0.1–1.0)
Monocytes Relative: 9 %
Neutro Abs: 1.7 K/uL (ref 1.7–7.7)
Neutrophils Relative %: 46 %
Platelet Count: 163 K/uL (ref 150–400)
RBC: 4.58 MIL/uL (ref 3.87–5.11)
RDW: 12.6 % (ref 11.5–15.5)
WBC Count: 3.7 K/uL — ABNORMAL LOW (ref 4.0–10.5)
nRBC: 0 % (ref 0.0–0.2)

## 2023-11-04 MED ORDER — ANASTROZOLE 1 MG PO TABS: 1.0000 mg | ORAL_TABLET | Freq: Every day | ORAL | 3 refills | Status: AC

## 2023-11-07 ENCOUNTER — Telehealth: Payer: Self-pay | Admitting: Nurse Practitioner

## 2023-11-07 NOTE — Telephone Encounter (Signed)
 Patient is called in asking to cancel appt. I cancel her appt.

## 2023-11-11 ENCOUNTER — Encounter: Payer: Self-pay | Admitting: Family Medicine

## 2023-11-11 ENCOUNTER — Telehealth (INDEPENDENT_AMBULATORY_CARE_PROVIDER_SITE_OTHER): Admitting: Family Medicine

## 2023-11-11 ENCOUNTER — Ambulatory Visit

## 2023-11-11 ENCOUNTER — Encounter: Payer: Self-pay | Admitting: Nurse Practitioner

## 2023-11-11 VITALS — Ht 66.0 in

## 2023-11-11 DIAGNOSIS — C50412 Malignant neoplasm of upper-outer quadrant of left female breast: Secondary | ICD-10-CM | POA: Diagnosis not present

## 2023-11-11 DIAGNOSIS — Z6833 Body mass index (BMI) 33.0-33.9, adult: Secondary | ICD-10-CM | POA: Insufficient documentation

## 2023-11-11 DIAGNOSIS — E785 Hyperlipidemia, unspecified: Secondary | ICD-10-CM

## 2023-11-11 DIAGNOSIS — Z170 Estrogen receptor positive status [ER+]: Secondary | ICD-10-CM

## 2023-11-11 MED ORDER — ATORVASTATIN CALCIUM 20 MG PO TABS: 20.0000 mg | ORAL_TABLET | Freq: Every day | ORAL | 3 refills | Status: AC

## 2023-11-11 NOTE — Progress Notes (Signed)
 Virtual Visit via Video Note I connected with Molly Fuller on 11/11/23 by a video enabled telemedicine application and verified that I am speaking with the correct person using two identifiers. Location patient: home Location provider:work office Persons participating in the virtual visit: patient, provider  I discussed the limitations of evaluation and management by telemedicine and the availability of in person appointments. The patient expressed understanding and agreed to proceed.  Chief Complaint  Patient presents with   Medical Management of Chronic Issues   HPI: Molly Fuller is a 73 yo female with PMHx significant for HLD, breast cancer s/p lumpectomy and radiation being seen today via video for follow up. Last visit on 05/06/22. Since her last visit, she has followed with oncologist for left breast cancer Dx'ed in 05/2019. S/P left lumpectomy in 07/2019, completed adjuvant radiation therapy, and currently on antiestrogen therapy, Anastrozole , planning on competing 7 years.  HLD on Atorvastatin  20 mg daily, which has not taken in 3 weeks, ran out. She has tolerated medication well.   Lab Results  Component Value Date   CHOL 184 05/06/2022   HDL 61.00 05/06/2022   LDLCALC 110 (H) 05/06/2022   LDLDIRECT 262.7 12/29/2012   TRIG 67.0 05/06/2022   CHOLHDL 3 05/06/2022    Lab Results  Component Value Date   NA 141 11/04/2023   CL 109 11/04/2023   K 4.1 11/04/2023   CO2 25 11/04/2023   BUN 12 11/04/2023   CREATININE 0.75 11/04/2023   GFRNONAA >60 11/04/2023   CALCIUM  9.1 11/04/2023   ALBUMIN 3.8 11/04/2023   GLUCOSE 90 11/04/2023   Lab Results  Component Value Date   ALT 17 11/04/2023   AST 28 11/04/2023   ALKPHOS 63 11/04/2023   BILITOT 0.7 11/04/2023    ROS: See pertinent positives and negatives per HPI.  Past Medical History:  Diagnosis Date   Glaucoma    left eye   Hyperlipidemia    Hypertension    NO MEDS    Past Surgical History:  Procedure  Laterality Date   BREAST BIOPSY Left 2021   BREAST LUMPECTOMY Left 07/30/2019   BREAST LUMPECTOMY WITH RADIOACTIVE SEED AND SENTINEL LYMPH NODE BIOPSY Left 07/30/2019   Procedure: LEFT BREAST LUMPECTOMY WITH RADIOACTIVE SEED AND SENTINEL LYMPH NODE BIOPSY;  Surgeon: Aron Shoulders, MD;  Location: [facility] OR;  Service: General;  Laterality: Left;  REGIONAL FOR POST OP PAIN   no prior surgery     TUBAL LIGATION      Family History  Problem Relation Age of Onset   Colon cancer Paternal Grandmother 70   Hypertension Other    Diabetes Other    Breast cancer Neg Hx    Esophageal cancer Neg Hx    Stomach cancer Neg Hx    Rectal cancer Neg Hx    Colon polyps Neg Hx     Social History   Socioeconomic History   Marital status: Married    Spouse name: Not on file   Number of children: 2   Years of education: Not on file   Highest education level: Bachelor's degree (e.g., BA, AB, BS)  Occupational History   Occupation: retired Scientist, water quality   Tobacco Use   Smoking status: Never   Smokeless tobacco: Never  Vaping Use   Vaping status: Never Used  Substance and Sexual Activity   Alcohol use: No   Drug use: No   Sexual activity: Not on file  Other Topics Concern   Not on file  Social  History Narrative   Not on file   Social Drivers of Health   Financial Resource Strain: Low Risk  (11/07/2023)   Overall Financial Resource Strain (CARDIA)    Difficulty of Paying Living Expenses: Not hard at all  Food Insecurity: No Food Insecurity (11/07/2023)   Hunger Vital Sign    Worried About Running Out of Food in the Last Year: Never true    Ran Out of Food in the Last Year: Never true  Transportation Needs: No Transportation Needs (11/07/2023)   PRAPARE - Administrator, Civil Service (Medical): No    Lack of Transportation (Non-Medical): No  Physical Activity: Insufficiently Active (11/07/2023)   Exercise Vital Sign    Days of Exercise per Week: 1 day    Minutes of Exercise per  Session: 10 min  Stress: No Stress Concern Present (11/07/2023)   [employer] of Occupational Health - Occupational Stress Questionnaire    Feeling of Stress: Not at all  Social Connections: Socially Integrated (11/07/2023)   Social Connection and Isolation Panel    Frequency of Communication with Friends and Family: More than three times a week    Frequency of Social Gatherings with Friends and Family: More than three times a week    Attends Religious Services: More than 4 times per year    Active Member of [employer] or Organizations: Yes    Attends Engineer, structural: More than 4 times per year    Marital Status: Married  Catering manager Violence: Not At Risk (12/05/2020)   Humiliation, Afraid, Rape, and Kick questionnaire    Fear of Current or Ex-Partner: No    Emotionally Abused: No    Physically Abused: No    Sexually Abused: No     Current Outpatient Medications:    anastrozole  (ARIMIDEX ) 1 MG tablet, Take 1 tablet (1 mg total) by mouth daily., Disp: 90 tablet, Rfl: 3   brimonidine-timolol (COMBIGAN) 0.2-0.5 % ophthalmic solution, Place 1 drop into both eyes every 12 (twelve) hours., Disp: , Rfl:    dorzolamide (TRUSOPT) 2 % ophthalmic solution, Place 1 drop into both eyes 2 (two) times daily., Disp: , Rfl: 11   EPINEPHrine  0.3 mg/0.3 mL IJ SOAJ injection, Inject 0.3 mg into the muscle as needed for anaphylaxis., Disp: , Rfl:    latanoprost (XALATAN) 0.005 % ophthalmic solution, Place 1 drop into both eyes at bedtime. , Disp: , Rfl:    atorvastatin  (LIPITOR) 20 MG tablet, Take 1 tablet (20 mg total) by mouth daily., Disp: 90 tablet, Rfl: 3  EXAM:  VITALS per patient if applicable: Ht 5' 6 (1.676 m)   LMP 02/19/2000   BMI 33.52 kg/m    GENERAL: alert, oriented, appears well and in no acute distress  HEENT: atraumatic, conjunctiva clear, no obvious abnormalities on inspection of external nose and ears  NECK: normal movements of the head and neck  LUNGS: on  inspection no signs of respiratory distress, breathing rate appears normal, no obvious gross SOB, gasping or wheezing  CV: no obvious cyanosis  Molly: moves all visible extremities without noticeable abnormality  PSYCH/NEURO: pleasant and cooperative, no obvious depression or anxiety, speech and thought processing grossly intact  ASSESSMENT AND PLAN:  Discussed the following assessment and plan:  Hyperlipidemia, unspecified hyperlipidemia type - Plan: Lipid panel, atorvastatin  (LIPITOR) 20 MG tablet  Malignant neoplasm of upper-outer quadrant of left breast in female, estrogen receptor positive (HCC)  Hyperlipidemia, unspecified hyperlipidemia type Assessment & Plan: Continue Atorvastatin  20 mg daily  and low fat diet. She can arrange lab appt for fasting labs 1-2 months after resuming statin.  Orders: -     Lipid panel; Future -     Atorvastatin  Calcium ; Take 1 tablet (20 mg total) by mouth daily.  Dispense: 90 tablet; Refill: 3  Malignant neoplasm of upper-outer quadrant of left breast in female, estrogen receptor positive (HCC) Assessment & Plan: She follows with oncologist for left breast cancer Dx'ed in 05/2019. S/P left lumpectomy in 07/2019, completed adjuvant radiation therapy, and currently on antiestrogen therapy, Anastrozole  1 mg daily (since 09/2019), planning on competing 7 years.    We discussed possible serious and likely etiologies, options for evaluation and workup, limitations of telemedicine visit vs in person visit, treatment, treatment risks and precautions. The patient was advised to call back or seek an in-person evaluation if the symptoms worsen or if the condition fails to improve as anticipated. I discussed the assessment and treatment plan with the patient. The patient was provided an opportunity to ask questions and all were answered. The patient agreed with the plan and demonstrated an understanding of the instructions.  No follow-ups on file.  Lilya Smitherman  [place], MD

## 2023-11-11 NOTE — Assessment & Plan Note (Signed)
 Continue Atorvastatin  20 mg daily and low fat diet. She can arrange lab appt for fasting labs 1-2 months after resuming statin.

## 2023-11-11 NOTE — Assessment & Plan Note (Signed)
 Referral referral placed to [facility] health weight management clinic.  Clinic.  Clinic.

## 2023-11-11 NOTE — Assessment & Plan Note (Addendum)
 She follows with oncologist for left breast cancer Dx'ed in 05/2019. S/P left lumpectomy in 07/2019, completed adjuvant radiation therapy, and currently on antiestrogen therapy, Anastrozole  1 mg daily (since 09/2019), planning on competing 7 years.

## 2023-11-20 ENCOUNTER — Institutional Professional Consult (permissible substitution) (INDEPENDENT_AMBULATORY_CARE_PROVIDER_SITE_OTHER): Admitting: Nurse Practitioner

## 2023-12-31 ENCOUNTER — Other Ambulatory Visit: Payer: Self-pay | Admitting: Nurse Practitioner

## 2023-12-31 DIAGNOSIS — Z1231 Encounter for screening mammogram for malignant neoplasm of breast: Secondary | ICD-10-CM

## 2024-01-01 ENCOUNTER — Ambulatory Visit: Admitting: Family Medicine

## 2024-01-05 ENCOUNTER — Other Ambulatory Visit (INDEPENDENT_AMBULATORY_CARE_PROVIDER_SITE_OTHER)

## 2024-01-05 DIAGNOSIS — E785 Hyperlipidemia, unspecified: Secondary | ICD-10-CM

## 2024-01-05 LAB — LIPID PANEL
Cholesterol: 171 mg/dL (ref 0–200)
HDL: 62 mg/dL (ref >39.00)
LDL Cholesterol: 90 mg/dL (ref 0–99)
NonHDL: 109.27
Total CHOL/HDL Ratio: 3
Triglycerides: 97 mg/dL (ref 0.0–149.0)
VLDL: 19.4 mg/dL (ref 0.0–40.0)

## 2024-01-07 ENCOUNTER — Ambulatory Visit: Payer: Self-pay | Admitting: Family Medicine

## 2024-04-09 ENCOUNTER — Ambulatory Visit

## 2024-06-07 ENCOUNTER — Ambulatory Visit

## 2024-06-08 ENCOUNTER — Ambulatory Visit: Admitting: Nurse Practitioner

## 2024-06-08 ENCOUNTER — Other Ambulatory Visit
